# Patient Record
Sex: Female | Born: 1950 | Race: White | Hispanic: No | Marital: Married | Smoking: Never smoker
Health system: Southern US, Community
[De-identification: ages and names within clinical notes are randomized; demographics above are authoritative.]

## PROBLEM LIST (undated history)

## (undated) DIAGNOSIS — M2550 Pain in unspecified joint: Secondary | ICD-10-CM

## (undated) DIAGNOSIS — O2230 Deep phlebothrombosis in pregnancy, unspecified trimester: Secondary | ICD-10-CM

## (undated) DIAGNOSIS — M549 Dorsalgia, unspecified: Secondary | ICD-10-CM

## (undated) DIAGNOSIS — R3915 Urgency of urination: Secondary | ICD-10-CM

## (undated) DIAGNOSIS — J189 Pneumonia, unspecified organism: Secondary | ICD-10-CM

## (undated) DIAGNOSIS — K5780 Diverticulitis of intestine, part unspecified, with perforation and abscess without bleeding: Secondary | ICD-10-CM

## (undated) DIAGNOSIS — Z87442 Personal history of urinary calculi: Secondary | ICD-10-CM

## (undated) DIAGNOSIS — I8390 Asymptomatic varicose veins of unspecified lower extremity: Secondary | ICD-10-CM

## (undated) DIAGNOSIS — R350 Frequency of micturition: Secondary | ICD-10-CM

## (undated) DIAGNOSIS — F419 Anxiety disorder, unspecified: Secondary | ICD-10-CM

## (undated) DIAGNOSIS — N200 Calculus of kidney: Secondary | ICD-10-CM

## (undated) DIAGNOSIS — Z8619 Personal history of other infectious and parasitic diseases: Secondary | ICD-10-CM

## (undated) HISTORY — DX: Diverticulitis of intestine, part unspecified, with perforation and abscess without bleeding: K57.80

## (undated) HISTORY — DX: Deep phlebothrombosis in pregnancy, unspecified trimester: O22.30

## (undated) HISTORY — PX: TONSILLECTOMY: SUR1361

## (undated) HISTORY — PX: COLONOSCOPY: SHX174

## (undated) HISTORY — DX: Dorsalgia, unspecified: M54.9

## (undated) HISTORY — DX: Asymptomatic varicose veins of unspecified lower extremity: I83.90

## (undated) HISTORY — DX: Calculus of kidney: N20.0

## (undated) HISTORY — PX: OTHER SURGICAL HISTORY: SHX169

---
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
## 1981-05-20 DIAGNOSIS — O2230 Deep phlebothrombosis in pregnancy, unspecified trimester: Secondary | ICD-10-CM

## 1981-05-20 HISTORY — DX: Deep phlebothrombosis in pregnancy, unspecified trimester: O22.30

## 1999-06-05 ENCOUNTER — Other Ambulatory Visit: Admission: RE | Admit: 1999-06-05 | Discharge: 1999-06-05 | Payer: Self-pay | Admitting: *Deleted

## 2000-04-29 ENCOUNTER — Encounter: Payer: Self-pay | Admitting: Emergency Medicine

## 2000-04-29 ENCOUNTER — Emergency Department (HOSPITAL_COMMUNITY): Admission: EM | Admit: 2000-04-29 | Discharge: 2000-04-29 | Payer: Self-pay | Admitting: Emergency Medicine

## 2000-06-17 ENCOUNTER — Other Ambulatory Visit: Admission: RE | Admit: 2000-06-17 | Discharge: 2000-06-17 | Payer: Self-pay | Admitting: *Deleted

## 2001-06-18 ENCOUNTER — Other Ambulatory Visit: Admission: RE | Admit: 2001-06-18 | Discharge: 2001-06-18 | Payer: Self-pay | Admitting: Obstetrics and Gynecology

## 2002-06-30 ENCOUNTER — Other Ambulatory Visit: Admission: RE | Admit: 2002-06-30 | Discharge: 2002-06-30 | Payer: Self-pay | Admitting: Obstetrics and Gynecology

## 2003-07-08 ENCOUNTER — Other Ambulatory Visit: Admission: RE | Admit: 2003-07-08 | Discharge: 2003-07-08 | Payer: Self-pay | Admitting: Obstetrics and Gynecology

## 2004-08-03 ENCOUNTER — Ambulatory Visit: Payer: Self-pay | Admitting: Gastroenterology

## 2004-08-10 ENCOUNTER — Ambulatory Visit: Payer: Self-pay | Admitting: Gastroenterology

## 2005-10-21 ENCOUNTER — Ambulatory Visit: Payer: Self-pay | Admitting: Internal Medicine

## 2005-11-01 ENCOUNTER — Ambulatory Visit: Payer: Self-pay | Admitting: Internal Medicine

## 2005-12-06 ENCOUNTER — Ambulatory Visit: Payer: Self-pay | Admitting: Internal Medicine

## 2006-10-31 ENCOUNTER — Ambulatory Visit: Payer: Self-pay | Admitting: Internal Medicine

## 2006-10-31 LAB — CONVERTED CEMR LAB
ALT: 27 units/L (ref 0–40)
AST: 27 units/L (ref 0–37)
Albumin: 4.1 g/dL (ref 3.5–5.2)
Alkaline Phosphatase: 52 units/L (ref 39–117)
BUN: 12 mg/dL (ref 6–23)
Basophils Absolute: 0.1 10*3/uL (ref 0.0–0.1)
Basophils Relative: 0.9 % (ref 0.0–1.0)
Bilirubin, Direct: 0.1 mg/dL (ref 0.0–0.3)
CO2: 30 meq/L (ref 19–32)
Calcium: 9.8 mg/dL (ref 8.4–10.5)
Chloride: 102 meq/L (ref 96–112)
Cholesterol: 244 mg/dL (ref 0–200)
Creatinine, Ser: 0.4 mg/dL (ref 0.4–1.2)
Direct LDL: 116.7 mg/dL
Eosinophils Absolute: 0.2 10*3/uL (ref 0.0–0.6)
Eosinophils Relative: 3.8 % (ref 0.0–5.0)
GFR calc Af Amer: 214 mL/min
GFR calc non Af Amer: 177 mL/min
Glucose, Bld: 90 mg/dL (ref 70–99)
HCT: 43.1 % (ref 36.0–46.0)
HDL: 92.6 mg/dL (ref >39.0)
Hemoglobin: 14.9 g/dL (ref 12.0–15.0)
Lymphocytes Relative: 21.4 % (ref 12.0–46.0)
MCHC: 34.7 g/dL (ref 30.0–36.0)
MCV: 90.9 fL (ref 78.0–100.0)
Monocytes Absolute: 0.5 10*3/uL (ref 0.2–0.7)
Monocytes Relative: 8.5 % (ref 3.0–11.0)
Neutro Abs: 3.6 10*3/uL (ref 1.4–7.7)
Neutrophils Relative %: 65.4 % (ref 43.0–77.0)
Platelets: 220 10*3/uL (ref 150–400)
Potassium: 3.6 meq/L (ref 3.5–5.1)
RBC: 4.74 M/uL (ref 3.87–5.11)
RDW: 12.2 % (ref 11.5–14.6)
Sodium: 141 meq/L (ref 135–145)
TSH: 1.17 microintl units/mL (ref 0.35–5.50)
Total Bilirubin: 0.9 mg/dL (ref 0.3–1.2)
Total CHOL/HDL Ratio: 2.6
Total Protein: 7.1 g/dL (ref 6.0–8.3)
Triglycerides: 49 mg/dL (ref 0–149)
VLDL: 10 mg/dL (ref 0–40)
WBC: 5.6 10*3/uL (ref 4.5–10.5)

## 2006-11-14 ENCOUNTER — Ambulatory Visit: Payer: Self-pay | Admitting: Internal Medicine

## 2006-12-24 ENCOUNTER — Encounter: Payer: Self-pay | Admitting: Internal Medicine

## 2007-01-01 ENCOUNTER — Encounter: Payer: Self-pay | Admitting: Internal Medicine

## 2007-01-12 ENCOUNTER — Telehealth: Payer: Self-pay | Admitting: Internal Medicine

## 2007-01-20 ENCOUNTER — Telehealth (INDEPENDENT_AMBULATORY_CARE_PROVIDER_SITE_OTHER): Payer: Self-pay | Admitting: *Deleted

## 2007-01-21 ENCOUNTER — Ambulatory Visit: Payer: Self-pay | Admitting: Internal Medicine

## 2007-01-21 DIAGNOSIS — R21 Rash and other nonspecific skin eruption: Secondary | ICD-10-CM | POA: Insufficient documentation

## 2007-10-06 ENCOUNTER — Ambulatory Visit: Payer: Self-pay | Admitting: Vascular Surgery

## 2007-11-13 ENCOUNTER — Ambulatory Visit: Payer: Self-pay | Admitting: Internal Medicine

## 2007-11-13 LAB — CONVERTED CEMR LAB
ALT: 21 units/L (ref 0–35)
AST: 25 units/L (ref 0–37)
Albumin: 3.7 g/dL (ref 3.5–5.2)
Alkaline Phosphatase: 40 units/L (ref 39–117)
BUN: 12 mg/dL (ref 6–23)
Basophils Absolute: 0 10*3/uL (ref 0.0–0.1)
Basophils Relative: 0.2 % (ref 0.0–1.0)
Bilirubin Urine: NEGATIVE
Bilirubin, Direct: 0.1 mg/dL (ref 0.0–0.3)
CO2: 31 meq/L (ref 19–32)
Calcium: 9.5 mg/dL (ref 8.4–10.5)
Chloride: 104 meq/L (ref 96–112)
Cholesterol: 214 mg/dL (ref 0–200)
Creatinine, Ser: 0.5 mg/dL (ref 0.4–1.2)
Direct LDL: 107 mg/dL
Eosinophils Absolute: 0.3 10*3/uL (ref 0.0–0.7)
Eosinophils Relative: 5.7 % — ABNORMAL HIGH (ref 0.0–5.0)
GFR calc Af Amer: 165 mL/min
GFR calc non Af Amer: 136 mL/min
Glucose, Bld: 96 mg/dL (ref 70–99)
Glucose, Urine, Semiquant: NEGATIVE
HCT: 40.4 % (ref 36.0–46.0)
HDL: 86.2 mg/dL (ref >39.0)
Hemoglobin: 13.9 g/dL (ref 12.0–15.0)
Ketones, urine, test strip: NEGATIVE
Lymphocytes Relative: 21.9 % (ref 12.0–46.0)
MCHC: 34.3 g/dL (ref 30.0–36.0)
MCV: 90.7 fL (ref 78.0–100.0)
Monocytes Absolute: 0.5 10*3/uL (ref 0.1–1.0)
Monocytes Relative: 9.7 % (ref 3.0–12.0)
Neutro Abs: 3 10*3/uL (ref 1.4–7.7)
Neutrophils Relative %: 62.5 % (ref 43.0–77.0)
Nitrite: NEGATIVE
Platelets: 212 10*3/uL (ref 150–400)
Potassium: 4.1 meq/L (ref 3.5–5.1)
RBC: 4.46 M/uL (ref 3.87–5.11)
RDW: 12.4 % (ref 11.5–14.6)
Sodium: 142 meq/L (ref 135–145)
Specific Gravity, Urine: 1.025
TSH: 1.19 microintl units/mL (ref 0.35–5.50)
Total Bilirubin: 0.7 mg/dL (ref 0.3–1.2)
Total CHOL/HDL Ratio: 2.5
Total Protein: 6.6 g/dL (ref 6.0–8.3)
Triglycerides: 45 mg/dL (ref 0–149)
Urobilinogen, UA: 0.2
VLDL: 9 mg/dL (ref 0–40)
WBC: 4.9 10*3/uL (ref 4.5–10.5)
pH: 5.5

## 2007-12-01 ENCOUNTER — Ambulatory Visit: Payer: Self-pay | Admitting: Internal Medicine

## 2007-12-01 DIAGNOSIS — Z8672 Personal history of thrombophlebitis: Secondary | ICD-10-CM

## 2007-12-01 DIAGNOSIS — I872 Venous insufficiency (chronic) (peripheral): Secondary | ICD-10-CM | POA: Insufficient documentation

## 2007-12-25 ENCOUNTER — Encounter: Payer: Self-pay | Admitting: Internal Medicine

## 2008-01-19 ENCOUNTER — Ambulatory Visit: Payer: Self-pay | Admitting: Vascular Surgery

## 2008-02-22 ENCOUNTER — Ambulatory Visit: Payer: Self-pay | Admitting: Vascular Surgery

## 2008-02-29 ENCOUNTER — Ambulatory Visit: Payer: Self-pay | Admitting: Vascular Surgery

## 2008-03-28 ENCOUNTER — Ambulatory Visit: Payer: Self-pay | Admitting: Vascular Surgery

## 2008-04-12 ENCOUNTER — Ambulatory Visit: Payer: Self-pay | Admitting: Vascular Surgery

## 2008-07-18 LAB — CONVERTED CEMR LAB: Pap Smear: NORMAL

## 2008-07-28 ENCOUNTER — Ambulatory Visit: Payer: Self-pay | Admitting: Vascular Surgery

## 2008-12-29 ENCOUNTER — Encounter: Payer: Self-pay | Admitting: Internal Medicine

## 2009-01-06 ENCOUNTER — Ambulatory Visit: Payer: Self-pay | Admitting: Internal Medicine

## 2009-01-06 LAB — CONVERTED CEMR LAB
ALT: 24 units/L (ref 0–35)
AST: 28 units/L (ref 0–37)
Albumin: 4.1 g/dL (ref 3.5–5.2)
Alkaline Phosphatase: 52 units/L (ref 39–117)
BUN: 13 mg/dL (ref 6–23)
Basophils Absolute: 0 10*3/uL (ref 0.0–0.1)
Basophils Relative: 0.1 % (ref 0.0–3.0)
Bilirubin Urine: NEGATIVE
Bilirubin, Direct: 0.1 mg/dL (ref 0.0–0.3)
CO2: 34 meq/L — ABNORMAL HIGH (ref 19–32)
Calcium: 9.7 mg/dL (ref 8.4–10.5)
Chloride: 108 meq/L (ref 96–112)
Cholesterol: 229 mg/dL — ABNORMAL HIGH (ref 0–200)
Creatinine, Ser: 0.5 mg/dL (ref 0.4–1.2)
Direct LDL: 110.7 mg/dL
Eosinophils Absolute: 0.2 10*3/uL (ref 0.0–0.7)
Eosinophils Relative: 3.1 % (ref 0.0–5.0)
GFR calc non Af Amer: 135.51 mL/min (ref >60)
Glucose, Bld: 98 mg/dL (ref 70–99)
Glucose, Urine, Semiquant: NEGATIVE
HCT: 42.7 % (ref 36.0–46.0)
HDL: 103.4 mg/dL (ref >39.00)
Hemoglobin: 15.3 g/dL — ABNORMAL HIGH (ref 12.0–15.0)
Ketones, urine, test strip: NEGATIVE
Lymphocytes Relative: 21.9 % (ref 12.0–46.0)
Lymphs Abs: 1.3 10*3/uL (ref 0.7–4.0)
MCHC: 35.8 g/dL (ref 30.0–36.0)
MCV: 91.4 fL (ref 78.0–100.0)
Monocytes Absolute: 0.5 10*3/uL (ref 0.1–1.0)
Monocytes Relative: 8.8 % (ref 3.0–12.0)
Neutro Abs: 3.9 10*3/uL (ref 1.4–7.7)
Neutrophils Relative %: 66.1 % (ref 43.0–77.0)
Nitrite: NEGATIVE
Platelets: 178 10*3/uL (ref 150.0–400.0)
Potassium: 5.2 meq/L — ABNORMAL HIGH (ref 3.5–5.1)
Protein, U semiquant: NEGATIVE
RBC: 4.67 M/uL (ref 3.87–5.11)
RDW: 12.2 % (ref 11.5–14.6)
Sodium: 145 meq/L (ref 135–145)
Specific Gravity, Urine: 1.02
TSH: 0.97 microintl units/mL (ref 0.35–5.50)
Total Bilirubin: 0.9 mg/dL (ref 0.3–1.2)
Total CHOL/HDL Ratio: 2
Total Protein: 7.3 g/dL (ref 6.0–8.3)
Triglycerides: 41 mg/dL (ref 0.0–149.0)
Urobilinogen, UA: 0.2
VLDL: 8.2 mg/dL (ref 0.0–40.0)
WBC: 5.9 10*3/uL (ref 4.5–10.5)
pH: 5.5

## 2009-01-24 ENCOUNTER — Ambulatory Visit: Payer: Self-pay | Admitting: Internal Medicine

## 2009-01-24 DIAGNOSIS — M549 Dorsalgia, unspecified: Secondary | ICD-10-CM

## 2009-01-24 HISTORY — DX: Dorsalgia, unspecified: M54.9

## 2009-01-24 LAB — CONVERTED CEMR LAB
Bilirubin Urine: NEGATIVE
Blood in Urine, dipstick: NEGATIVE
Glucose, Urine, Semiquant: NEGATIVE
Ketones, urine, test strip: NEGATIVE
Nitrite: NEGATIVE
Protein, U semiquant: 30
Specific Gravity, Urine: >=1.03
Urobilinogen, UA: 0.2
WBC Urine, dipstick: NEGATIVE
pH: 5.5

## 2009-01-25 LAB — CONVERTED CEMR LAB
BUN: 15 mg/dL (ref 6–23)
CO2: 31 meq/L (ref 19–32)
Calcium: 9.7 mg/dL (ref 8.4–10.5)
Chloride: 105 meq/L (ref 96–112)
Creatinine, Ser: 0.4 mg/dL (ref 0.4–1.2)
Creatinine,U: 104 mg/dL
GFR calc non Af Amer: 175.28 mL/min (ref >60)
Glucose, Bld: 103 mg/dL — ABNORMAL HIGH (ref 70–99)
Microalb Creat Ratio: 19.2 mg/g (ref 0.0–30.0)
Microalb, Ur: 2 mg/dL — ABNORMAL HIGH (ref 0.0–1.9)
Potassium: 4.2 meq/L (ref 3.5–5.1)
Sodium: 143 meq/L (ref 135–145)

## 2009-03-03 ENCOUNTER — Encounter: Payer: Self-pay | Admitting: Internal Medicine

## 2009-05-20 DIAGNOSIS — Z87442 Personal history of urinary calculi: Secondary | ICD-10-CM

## 2009-05-20 HISTORY — DX: Personal history of urinary calculi: Z87.442

## 2009-08-03 ENCOUNTER — Ambulatory Visit: Payer: Self-pay | Admitting: Vascular Surgery

## 2009-08-03 LAB — CONVERTED CEMR LAB: Pap Smear: NORMAL

## 2010-01-17 ENCOUNTER — Ambulatory Visit: Payer: Self-pay | Admitting: Internal Medicine

## 2010-01-17 LAB — CONVERTED CEMR LAB
ALT: 19 units/L (ref 0–35)
AST: 26 units/L (ref 0–37)
Albumin: 4.1 g/dL (ref 3.5–5.2)
Alkaline Phosphatase: 46 units/L (ref 39–117)
Basophils Relative: 0.6 % (ref 0.0–3.0)
Bilirubin, Direct: 0.1 mg/dL (ref 0.0–0.3)
CO2: 33 meq/L — ABNORMAL HIGH (ref 19–32)
Calcium: 9.6 mg/dL (ref 8.4–10.5)
Chloride: 102 meq/L (ref 96–112)
Creatinine, Ser: 0.4 mg/dL (ref 0.4–1.2)
Eosinophils Absolute: 0.3 10*3/uL (ref 0.0–0.7)
Eosinophils Relative: 6.1 % — ABNORMAL HIGH (ref 0.0–5.0)
Hemoglobin: 14.7 g/dL (ref 12.0–15.0)
Lymphocytes Relative: 26.4 % (ref 12.0–46.0)
MCHC: 34.3 g/dL (ref 30.0–36.0)
Neutro Abs: 2.8 10*3/uL (ref 1.4–7.7)
Neutrophils Relative %: 57.5 % (ref 43.0–77.0)
Nitrite: NEGATIVE
Protein, U semiquant: NEGATIVE
RBC: 4.55 M/uL (ref 3.87–5.11)
Sodium: 141 meq/L (ref 135–145)
Total CHOL/HDL Ratio: 2
Total Protein: 6.8 g/dL (ref 6.0–8.3)
Triglycerides: 37 mg/dL (ref 0.0–149.0)
Urobilinogen, UA: 0.2
VLDL: 7.4 mg/dL (ref 0.0–40.0)
WBC: 4.9 10*3/uL (ref 4.5–10.5)

## 2010-01-31 ENCOUNTER — Ambulatory Visit: Payer: Self-pay | Admitting: Internal Medicine

## 2010-01-31 DIAGNOSIS — S301XXA Contusion of abdominal wall, initial encounter: Secondary | ICD-10-CM

## 2010-02-05 ENCOUNTER — Encounter: Payer: Self-pay | Admitting: Internal Medicine

## 2010-02-09 ENCOUNTER — Encounter: Payer: Self-pay | Admitting: Internal Medicine

## 2010-02-09 ENCOUNTER — Ambulatory Visit: Payer: Self-pay | Admitting: Cardiology

## 2010-02-09 ENCOUNTER — Ambulatory Visit: Payer: Self-pay

## 2010-02-09 ENCOUNTER — Ambulatory Visit (HOSPITAL_COMMUNITY): Admission: RE | Admit: 2010-02-09 | Discharge: 2010-02-09 | Payer: Self-pay | Admitting: Internal Medicine

## 2010-05-01 ENCOUNTER — Emergency Department (HOSPITAL_COMMUNITY)
Admission: EM | Admit: 2010-05-01 | Discharge: 2010-05-02 | Payer: Self-pay | Source: Home / Self Care | Admitting: Emergency Medicine

## 2010-05-01 ENCOUNTER — Ambulatory Visit: Payer: Self-pay | Admitting: Internal Medicine

## 2010-05-01 DIAGNOSIS — R350 Frequency of micturition: Secondary | ICD-10-CM | POA: Insufficient documentation

## 2010-05-01 LAB — CONVERTED CEMR LAB
Nitrite: NEGATIVE
Protein, U semiquant: NEGATIVE
Urobilinogen, UA: 0.2
WBC Urine, dipstick: NEGATIVE

## 2010-05-02 ENCOUNTER — Telehealth: Payer: Self-pay | Admitting: Internal Medicine

## 2010-05-02 ENCOUNTER — Telehealth: Payer: Self-pay | Admitting: *Deleted

## 2010-05-02 ENCOUNTER — Encounter: Payer: Self-pay | Admitting: Internal Medicine

## 2010-05-02 DIAGNOSIS — N200 Calculus of kidney: Secondary | ICD-10-CM

## 2010-05-02 HISTORY — DX: Calculus of kidney: N20.0

## 2010-05-04 ENCOUNTER — Encounter: Payer: Self-pay | Admitting: Internal Medicine

## 2010-06-05 ENCOUNTER — Ambulatory Visit
Admission: RE | Admit: 2010-06-05 | Discharge: 2010-06-05 | Payer: Self-pay | Source: Home / Self Care | Attending: Vascular Surgery | Admitting: Vascular Surgery

## 2010-06-19 NOTE — Letter (Signed)
Summary: [place]  [facility]   Imported By: Maryln Gottron 02/14/2010 12:38:13  _____________________________________________________________________  External Attachment:    Type:   Image     Comment:   External Document

## 2010-06-19 NOTE — Assessment & Plan Note (Signed)
Summary: CPX/CJR   Vital Signs:  Patient profile:   60 year old female Menstrual status:  postmenopausal Height:      68 inches Weight:      131 pounds BMI:     19.99 Pulse rate:   72 / minute BP sitting:   132 / 80  (left arm) Cuff size:   regular  Vitals Entered By: Romualdo Bolk, CMA (AAMA) (January 31, 2010 10:34 AM) CC: CPX no pap- Pt has a gyn.   History of Present Illness: Linda Richard  comes in today  for preventive visit .  since last visit she has been pretty well but has had some back   issues.  hx of shoulder and back injury in the remote  past .   fell  this week  on trail  over a root and hurt bak and burises  .Tends to" hurt all over.  "  but really left lower back with no  radiation , stiff l in am .    Minimal meds   for this and coping . Fam hx of non deforming arthritis..     UTD on PV parameters.    Preventive Care Screening  Mammogram:    Date:  01/02/2010    Results:  normal   Pap Smear:    Date:  08/03/2009    Results:  normal   Prior Values:    Pap Smear:  normal (07/18/2008)    Mammogram:  normal (12/29/2008)    Colonoscopy:  normal (08/10/2004)    Last Tetanus Booster:  Tdap (11/01/2005)   Preventive Screening-Counseling & Management  Alcohol-Tobacco     Alcohol drinks/day: <1     Alcohol type: wine     Smoking Status: never  Caffeine-Diet-Exercise     Caffeine use/day: 1     Does Patient Exercise: yes  Hep-HIV-STD-Contraception     Dental Visit-last 6 months yes     Sun Exposure-Excessive: no  Safety-Violence-Falls     Seat Belt Use: yes     Firearms in the Home: firearms in the home     Firearm Counseling: not indicated; uses recommended firearm safety measures     Smoke Detectors: yes     Fall Risk: no usually   Current Medications (verified): 1)  Adult Aspirin Low Strength 81 Mg  Tbdp (Aspirin) 2)  Multi-Vitamin   Tabs (Multiple Vitamin) 3)  Vitamin D 1000 Unit  Tabs (Cholecalciferol) 4)  Calcium 500/d 500-125  Mg-Unit  Tabs (Calcium Carbonate-Vitamin D) 5)  Fish Oil   Oil (Fish Oil) 6)  Vitamin E 400 Unit Caps (Vitamin E)  Allergies (verified): No Known Drug Allergies  Past History:  Past medical, surgical, family and social histories (including risk factors) reviewed, and no changes noted (except as noted below).  Past Medical History: Phlebitis/Blood Clots DVT  in pregnancy  Varivose veins   G3P2  CONSULTANTS L[employer] [address]  Past Surgical History: Reviewed history from 01/24/2009 and no changes required. Tonsillectomy Caesarean section Veins Stripped Laser rx VV  Past History:  Care Management: Gynecology: Margaretmary Bayley, NP Vascular Surgery: [facility] Dermatology : Margo Aye  Gastroenterology: [facility]  Family History: Reviewed history from 12/01/2007 and no changes required. Family History Hypertension Family History Kidney disease kidney stones Family History Osteoporosis Family History Ovarian cancer mom 58s  Family History Thyroid disease Father Bladder cancer 74's   son age 15 had irreg HB but ok had to have cardioversion and now on controller meds for 3 months otherwise  well  Social History: Reviewed history from 01/24/2009 and no changes required. Occupation:   retired   [employer] Child Support Never Smoked Alcohol use-yes Drug use-no Regular exercise-yes Married Hh of 2    Pet cat .   58 month old grandchild Fall Risk:  no usually  Seat Belt Use:  yes Dental Care w/in 6 mos.:  yes Sun Exposure-Excessive:  no  Review of Systems  The patient denies anorexia, fever, weight loss, weight gain, vision loss, decreased hearing, hoarseness, chest pain, syncope, dyspnea on exertion, peripheral edema, prolonged cough, headaches, hemoptysis, abdominal pain, melena, hematochezia, severe indigestion/heartburn, hematuria, muscle weakness, transient blindness, difficulty walking, depression, unusual weight change, abnormal bleeding, enlarged lymph nodes,  angioedema, and breast masses.    Physical Exam  General:  alert, well-developed, and well-nourished.   in nad normal appearance, healthy-appearing, and cooperative to examination.   Head:  normocephalic and atraumatic.   Eyes:  vision grossly intact, pupils equal, and pupils round.   Ears:  R ear normal, L ear normal, and no external deformities.   Nose:  no external deformity and no nasal discharge.   Mouth:  good dentition, pharynx pink and moist, and no erythema.   Neck:  No deformities, masses, or tenderness noted.no thyromegaly.   Chest Wall:  no bruising or deformity  Breasts:  No mass, nodules, thickening, tenderness, bulging, retraction, inflamation, nipple discharge or skin changes noted.   Lungs:  Normal respiratory effort, chest expands symmetrically. Lungs are clear to auscultation, no crackles or wheezes.no dullness.   Heart:  Normal rate and regular rhythm. S1 and S2 normal without gallop, murmur, , rub or other extra sounds.no lifts.   mid systolic click  when laying  no murmur heard  ( review of last ekg 2009 wnl) Abdomen:  Bowel sounds positive,abdomen soft and non-tender without masses, organomegaly or  noted. Genitalia:  per gyne Msk:  midl scoliosis  tender left lower back area but good rom today and Pulses:  R and L carotid,radial,femoral,dorsalis pedis and posterior tibial pulses are full and equal bilaterally varicose veins  Extremities:  no clubbing cyanosis or edema  Neurologic:  alert & oriented X3, strength normal in all extremities, gait normal, and DTRs symmetrical and normal.   Skin:  suprapubic  hematoma 5 cm  minimal tenderness no pelvic tenderness Cervical Nodes:  No lymphadenopathy noted Axillary Nodes:  No palpable lymphadenopathy Inguinal Nodes:  No significant adenopathy Psych:  Normal eye contact, appropriate affect. Cognition appears normal.    Impression & Recommendations:  Problem # 1:  HEALTH MAINTENANCE EXAM, ADULT (ICD-V70.0) continue  healthy lifestyle     preventive   discussed   labs  reviewed  . lipids elevated   but high hdls and ratio of 2   Problem # 2:  BACK PAIN (ICD-724.5)  left  and ongoing  and persistant   and stiff in am      suggest  referral  about dx and management  no other alarm features.  Her updated medication list for this problem includes:    Adult Aspirin Low Strength 81 Mg Tbdp (Aspirin)  Orders: Orthopedic Referral (Ortho)  Problem # 3:  UNDIAGNOSED CARDIAC SOUND SMID SYS CLICK (ICD-785.2) Asymptomatic  ? MVP   no murmur with this  .  dsic   will get echo  . has had nl ekgs  ( son with hs of rhythym problem  requiring cardioversion. )    Orders: Cardiology Referral (Cardiology)  Problem # 4:  ABDOMINAL WALL CONTUSION (ICD-922.2) Assessment: New uncomplicated    monitor   call if needed  Problem # 5:  VENOUS INSUFFICIENCY, CHRONIC (ICD-459.81) Assessment: Comment Only  Complete Medication List: 1)  Adult Aspirin Low Strength 81 Mg Tbdp (Aspirin) 2)  Multi-vitamin Tabs (Multiple vitamin) 3)  Vitamin D 1000 Unit Tabs (Cholecalciferol) 4)  Calcium 500/d 500-125 Mg-unit Tabs (Calcium carbonate-vitamin d) 5)  Fish Oil Oil (Fish oil) 6)  Vitamin E 400 Unit Caps (Vitamin e)  Other Orders: Future Orders: Admin 1st Vaccine (16109) ... 02/01/2010 Flu Vaccine 79yrs + (60454) ... 02/01/2010  Patient Instructions: 1)  consider taking 600- 800 ibuprofen at night or in am.    for pain.  2)   Will do ortho referral  for back.  3)  cool compresses to bruising area . 4)  will contact you about echo test of heart to check click sound. 5)  check yearly  or as needed.  Flu Vaccine Consent Questions     Do you have a history of severe allergic reactions to this vaccine? no    Any prior history of allergic reactions to egg and/or gelatin? no    Do you have a sensitivity to the preservative Thimersol? no    Do you have a past history of Guillan-Barre Syndrome? no    Do you currently have an acute  febrile illness? no    Have you ever had a severe reaction to latex? no    Vaccine information given and explained to patient? yes    Are you currently pregnant? no    Lot Number:[id number]   Exp Date:11/17/2010   Site Given  Left Deltoid IM     .lbflu Romualdo Bolk, CMA (AAMA)  February 01, 2010 7:39 AM

## 2010-06-21 NOTE — Assessment & Plan Note (Signed)
Summary: UTI? // RS   Vital Signs:  Patient profile:   60 year old female Menstrual status:  postmenopausal Weight:      130 pounds Temp:     98.7 degrees F oral Pulse rate:   78 / minute BP sitting:   140 / 80  (left arm) Cuff size:   regular  Vitals Entered By: Romualdo Bolk, CMA Duncan Dull) (May 01, 2010 3:41 PM) CC: Lower Back Pain, frequency, urgency   History of Present Illness: Linda Richard  comes in today  for   above prolem. Acute onset yesterday.  and sudden pressure and urinary urgency.  and now today pressure and and still feels urgency  no fever .  No NVD  change in bowel habits. Hx of uti with pregnancy  otherwise not.  NO vag signs      Back  pain has seen  ortho   reversal atble has helped .    Preventive Screening-Counseling & Management  Alcohol-Tobacco     Smoking Status: never  Caffeine-Diet-Exercise     Caffeine use/day: 1     Does Patient Exercise: yes  Hep-HIV-STD-Contraception     Dental Visit-last 6 months yes     Sun Exposure-Excessive: no  Safety-Violence-Falls     Seat Belt Use: yes     Fall Risk: no usually       Drug Use:  no.    Current Medications (verified): 1)  Adult Aspirin Low Strength 81 Mg  Tbdp (Aspirin) 2)  Multi-Vitamin   Tabs (Multiple Vitamin) 3)  Vitamin D 1000 Unit  Tabs (Cholecalciferol) 4)  Calcium 500/d 500-125 Mg-Unit  Tabs (Calcium Carbonate-Vitamin D) 5)  Fish Oil   Oil (Fish Oil) 6)  Vitamin E 400 Unit Caps (Vitamin E)  Allergies (verified): No Known Drug Allergies  Past History:  Past medical, surgical, family and social histories (including risk factors) reviewed for relevance to current acute and chronic problems.  Past Medical History: Reviewed history from 01/31/2010 and no changes required. 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 Tonsillectomy Caesarean section Veins Stripped Laser rx VV  Past History:  Care Management: Gynecology: Margaretmary Bayley, NP Vascular Surgery: [facility] Dermatology : Margo Aye  Gastroenterology: [facility]  Family History: Reviewed history from 12/01/2007 and no changes required. Family History Hypertension Family History Kidney disease kidney stones Family History Osteoporosis Family History Ovarian cancer mom 18s  Family History Thyroid disease Father Bladder cancer 46's   son age 83 had irreg HB but ok had to have cardioversion and now on controller meds for 3 months otherwise well  Social History: Reviewed history from 01/31/2010 and no changes required. Occupation:   retired   [employer] Child Support Never Smoked Alcohol use-yes Drug use-no Regular exercise-yes Married Hh of 2    Pet cat .   51 month old grandchild   Review of Systems  The patient denies anorexia, fever, abdominal pain, melena, hematochezia, severe indigestion/heartburn, hematuria, incontinence, and genital sores.    Physical Exam  General:  Well-developed,well-nourished,in no acute distress; alert,appropriate and cooperative throughout examination Head:  normocephalic and atraumatic.   Abdomen:  Bowel sounds positive,abdomen soft and non-tender without masses, organomegaly or hernias noted.  no flank pain    Extremities:  nl cap arefill  Neurologic:  non focal  Skin:  turgor normal, color normal, no ecchymoses, and no petechiae.  Cervical Nodes:  No lymphadenopathy noted Psych:  Oriented X3 and normally interactive.     Impression & Recommendations:  Problem # 1:  FREQUENCY, URINARY (ICD-788.41)  with fairly sudden onset   consider  low count uti .     cx and empiric rx.    avoid bladder irritants.  Orders: T-Culture, Urine ([id number]) UA Dipstick w/o Micro (automated)  (81003)  Discussed use of medication.   Problem # 2:  BACK PAIN (ICD-724.5) Assessment: Improved with  management Her updated medication list for this problem includes:    Adult Aspirin Low Strength 81 Mg Tbdp (Aspirin)  Complete Medication List: 1)  Adult Aspirin Low Strength 81 Mg Tbdp (Aspirin) 2)  Multi-vitamin Tabs (Multiple vitamin) 3)  Vitamin D 1000 Unit Tabs (Cholecalciferol) 4)  Calcium 500/d 500-125 Mg-unit Tabs (Calcium carbonate-vitamin d) 5)  Fish Oil Oil (Fish oil) 6)  Vitamin E 400 Unit Caps (Vitamin e) 7)  Ciprofloxacin Hcl 500 Mg Tabs (Ciprofloxacin hcl) .Marland Kitchen.. 1 by mouth two times a day for uti  Patient Instructions: 1)  take antibiotic   for presumed bladder infection 2)  should be better in 2-3 days. 3)  call if needed. 4)  Avoid carbonation and caffiene  and alcohol can aggravate it   until better.  Prescriptions: CIPROFLOXACIN HCL 500 MG TABS (CIPROFLOXACIN HCL) 1 by mouth two times a day for UTI  #6 x 0   Entered and Authorized by:   Madelin Headings MD   Signed by:   Madelin Headings MD on 05/01/2010   Method used:   Electronically to        [facility] [phone]* (retail)       [address]       [place], [place]  [id number]       Ph: [phone] or [phone]       Fax: [phone]   RxID:   [id number]    Orders Added: 1)  T-Culture, Urine [[id number]] 2)  Est. Patient Level III [95188] 3)  UA Dipstick w/o Micro (automated)  [81003]    Laboratory Results   Urine Tests  Date/Time Received: May 01, 2010 3:45 PM   Routine Urinalysis   Glucose: negative   (Normal Range: Negative) Bilirubin: negative   (Normal Range: Negative) Ketone: trace (5)   (Normal Range: Negative) Spec. Gravity: 1.020   (Normal Range: 1.003-1.035) Blood: trace-lysed   (Normal Range: Negative) pH: 6.5   (Normal Range: 5.0-8.0) Protein: negative   (Normal Range: Negative) Urobilinogen: 0.2   (Normal Range: 0-1) Nitrite: negative   (Normal Range: Negative) Leukocyte Esterace: negative   (Normal Range: Negative)    Comments:  Romualdo Bolk, CMA (AAMA)  May 01, 2010 3:45 PM

## 2010-06-21 NOTE — Progress Notes (Signed)
Summary: please advise   Phone Note Call from Patient Call back at Home Phone [phone]   Caller: Patient----triage vm Reason for Call: Talk to Nurse Summary of Call: was seen yesterday with a bladder infection. Went to ER last night with a kidney stone, which hasn't passed yet. The stone is small enough that she could pass it. she is at home. Initial call taken by: Warnell Forester,  May 02, 2010 9:59 AM  Follow-up for Phone Call        call patient   about how she is doing .    and anything we need to facilitate  her getting to the urologist .  our culture not back .   Also tell her she has gall stones.    not related to above  . consider having this removed   in the future but if no signs of this can leave alone  for now Follow-up by: Madelin Headings MD,  May 02, 2010 2:10 PM  Additional Follow-up for Phone Call Additional follow up Details #1::        I tried to call pt back but when I called pt back it was a fax. Additional Follow-up by: Romualdo Bolk, CMA (AAMA),  May 02, 2010 2:42 PM  New Problems: NEPHROLITHIASIS (ICD-592.0)   Additional Follow-up for Phone Call Additional follow up Details #2::    Pt called to check on status of msg...Marland KitchenMarland KitchenPlease call pt at home # [phone]  or   cell # [phone].  Follow-up by: Debbra Riding,  May 03, 2010 10:37 AM  Additional Follow-up for Phone Call Additional follow up Details #3:: Details for Additional Follow-up Action Taken: Pt is feeling some better but does want to go ahead with referral. Additional Follow-up by: Romualdo Bolk, CMA Duncan Dull),  May 03, 2010 11:34 AM  New Problems: NEPHROLITHIASIS Kathrynn Humble)    Ife, Vitelli - MRN: [id number] Acct#: [id number] PHYSICIAN DOCUMENTATION SHEET Wed Dec 14 02:44:09 EST 2011 Eligha Bridegroom. [facility] [address] [place], [place] [address] PHONE: [phone] MRN: [id number] Account #: [id number] Name: Kyna, Blahnik Sex:  F Age: 60 DOB: [birth year] Complaint: Flank pain Primary Diagnosis: Ureteral stone left Arrival Time: 05/01/2010 23:14 Discharge Time: 05/02/2010 02:42 All Providers: Dr. Kennon Rounds - MD PROVIDER: Dr. Kennon Rounds - MD HPI: The patient is a 60 year old female who presents with a chief complaint of flank pain. The history was provided by the patient and spouse. Pt. with onset left flank pain radiating to suprapubic region with dysuria and urinary hesitation/retention. Pt. started on antibiotic for presumed UTI. Pt. came in tonight for persistent discomfort, nausea, vomiting, urinary retention/urgency. The flank pain started yesterday. The onset was acute. The Pattern is constant. The Course is persistent. The flank pain is located in the left flank. The flank pain radiates to the left suprapubic region. It is characterized as cramping, sharp and colicky. The symptoms are described as moderate to severe. The condition is aggravated by palpation. The condition is relieved by nothing. The symptoms have been associated with hematuria, nausea and vomiting, while the symptoms have not been associated with fever. The last bowel movement was 12 hour(s) ago. The patient has a significant history of similar symptoms previously and recently being evaluated for this complaint. 01:20 05/02/2010 by Kennon Rounds - MD, Dr. Linus Orn: Statement: all systems negative except as marked or noted in the HPI 01:20 05/02/2010 by Kennon Rounds - MD, Dr. [facility] Documentation: physician reviewed/amended Historian: patient  Patient's Current Physicians Patient's Current Physicians (please list PCP first) Panosh - IM, Neta Mends Past medical history: urinary tract infection Surgical History: varicose vein stripping Social History: non-smoker, non-drinker, no drug abuse Special Needs: none 1 Haelee, Bolen - MRN: [id number] Acct#: [id number] Allergies Drug Reaction Allergy Note NKDA 01:20 05/02/2010 by Kennon Rounds - MD, Dr. Joseph Pierini Medications: Documentation: physician reviewed/amended Medications Medication [Medication] Dosage Frequency Last Dose ciprofloxacin Oral 500 mg bid 01:20 05/02/2010 by Kennon Rounds - MD, Dr. Physical examination: Vital signs and O2 SAT: reviewed Constitutional: well developed, well nourished, Uncomfortable appearing Eyes: normal appearance, no scleral icterus ENMT: mouth and pharynx normal Spine: lumbar spine non-tender Cardiovascular: regular rate and rhythm, no murmur, rub, or gallop Respiratory: normal, breath sounds clear & equal bilaterally, no rales, rhonchi, wheezes, or rub Chest: movement normal Abdomen: soft, nontender Genitourinary: CVA tenderness left Extremities: normal Neuro: Cranial Nerves II-XII intact, motor intact in all extremities Skin: color normal, no rash, warm, dry Psychiatric: AA&Ox4 01:20 05/02/2010 by Kennon Rounds - MD, Dr. Reviewed result: Result Type: Cleda Daub: [id number] Step Type: LAB Procedure Name: PREGNANCY, URINE POC Procedure: PREGNANCY, URINE POC Procedure Notes: PREGNANCY, URINE - THE SENSITIVITY OF THIS METHODOLOGY IS >24 mIU/mL Result: PREGNANCY, URINE NEGATIVE 00:10 05/02/2010 by Kennon Rounds - MD, Dr. Reviewed result: Result Type: Cleda Daub: [id number] Kayani, Rapaport - MRN: [id number] Acct#: [id number] Step Type: LAB Procedure Name: CBC WITH DIFF Procedure: CBC WITH DIFF Result: WBC COUNT 9.6 K/uL [4.0-10.5] RBC COUNT 4.65 MIL/uL [3.87-5.11] HEMOGLOBIN 14.4 g/dL [10.2-72.5] HEMATOCRIT 41.5 % [36.0-46.0] MCV 89.2 fL [78.0-100.0] MCH 31.0 pg [26.0-34.0] MCHC 34.7 g/dL [36.6-44.0] RDW 34.7 % [11.5-15.5] PLATELET COUNT 172 K/uL [150-400] NEUTROPHIL 81 % [43-77] H ABS GRANULOCYTE 7.7 K/uL [1.7-7.7] LYMPHOCYTE 12 % [12-46] ABS LYMPH 1.1 K/uL [0.7-4.0] MONOCYTE 7 % [3-12] ABS MONOCYTE 0.6 K/uL [0.1-1.0] EOSINOPHIL 1 % [0-5] ABS EOS 0.1 K/uL [0.0-0.7] BASOPHIL 0 %  [0-1] ABS BASO 0.0 K/uL [0.0-0.1] 00:50 05/02/2010 by Kennon Rounds - MD, Dr. Reviewed result: Result Type: Cleda Daub: [id number] Step Type: LAB Procedure Name: URINE MACROSCOPIC Procedure: URINE MACROSCOPIC Result: URINE COLOR YELLOW [YELLOW] URINE APPEARANCE CLOUDY [CLEAR] A URINE SPEC GRAVITY 1.023 [1.005-1.030] URINE PH 5.0 [5.0-8.0] URINE GLUCOSE NEGATIVE mg/dL [NEG] URINE HEMOGLOBIN LARGE [NEG] A URINE BILIRUBIN NEGATIVE [NEG] URINE KETONES NEGATIVE mg/dL [NEG] URINE TOTAL PROTEIN NEGATIVE mg/dL [NEG] URINE UROBILINOGEN 0.2 mg/dL [7.5-6.4] URINE NITRITE NEGATIVE [NEG] LEUKOCYTE ESTERASE NEGATIVE [NEG] 01:15 05/02/2010 by Kennon Rounds - MD, Dr. Shannan Harper Lari, Linson - MRN: [id number] Acct#: [id number] Reviewed result: Result Type: Cleda Daub: [id number] Step Type: LAB Procedure Name: URINE MICROSCOPIC Procedure: URINE MICROSCOPIC Result: URINE RBC'S 21-50 RBC/hpf [<3] CAST HYALINE CASTS [NEG] A URINE OTHER MUCOUS PRESENT 01:15 05/02/2010 by Kennon Rounds - MD, Dr. Reviewed result: Result Type: Cleda Daub: [id number] Step Type: LAB Procedure Name: BASIC METABOLIC PANEL Procedure: BASIC METABOLIC PANEL Procedure Notes: GFR, Est Afr Am - The eGFR has been calculated using the MDRD equation. This calculation has not been validated in all clinical situations. eGFR's persistently <60 mL/min signify possible Chronic Kidney Disease. Result: SODIUM 138 mEq/L [135-145] POTASSIUM 3.7 mEq/L [3.5-5.1] CHLORIDE 100 mEq/L [96-112] CARBON DIOXIDE 28 mEq/L [19-32] GLUCOSE 139 mg/dL [55-73] H BUN 17 mg/dL [2-20] CREATININE 2.54 mg/dL [2.7-0.6] CALCIUM 9.5 mg/dL [2.3-76.2] GFR, Est Non Af Am >60 mL/min [>60] GFR, Est Afr Am >60 mL/min [>60] 01:35 05/02/2010 by Kennon Rounds - MD, Dr. Reviewed result: Result Type: Cleda Daub: [id number] Step Type: XRAY Procedure Name:  CT ABD/PELVIS WO CM Procedure: CT ABD/PELVIS WO CM Result: Clinical  Data: Abdominal pain and left flank pain; dysuria, nausea and vomiting. 4 Noble, Bodie MRN: [id number] Acct#: [id number] CT ABDOMEN AND PELVIS WITHOUT CONTRAST Technique: Multidetector CT imaging of the abdomen and pelvis was performed following the standard protocol without intravenous contrast. Comparison: None. Findings: The visualized lung bases are clear. The liver and spleen are unremarkable in appearance. Note is made of a single stone within the gallbladder, measuring approximately 1.5 cm in size. The gallbladder is otherwise unremarkable in appearance. The pancreas and adrenal glands are unremarkable. There is moderate left-sided hydronephrosis, with left-sided perinephric stranding and fluid, and prominence of the left ureter to the level of an obstructing 4 x 2 mm stone in the distal left ureter, just proximal to the left vesicoureteral junction. An additional tiny 2 mm nonobstructing stone is noted at the upper pole of the left kidney; there is also a 2 mm stone at the interpole region of the right kidney. The right kidney is otherwise unremarkable in appearance. No free fluid is identified. The small bowel is unremarkable in appearance. The stomach is within normal limits. No acute vascular abnormalities are seen. The venous structures are not well assessed given the lack of contrast. The appendix is not readily characterized; there is no evidence of appendicitis. Diverticulosis is noted along the sigmoid colon, without evidence of diverticulitis. The bladder is decompressed, with a Foley catheter in place. No inguinal lymphadenopathy is seen. Prominent varices are noted within the anterior soft tissues at the upper pelvis. No acute osseous abnormalities are identified. Vacuum phenomenon is noted at L4-L5; mild right convex upper lumbar scoliosis is noted. IMPRESSION: 1. Moderate left-sided hydronephrosis, with left-sided perinephric stranding and fluid, and an  obstructing 4 x 2 mm stone in the distal left ureter, just proximal to the left vesicoureteral junction. 2. Tiny nonobstructing stones noted within both kidneys. 3. Cholelithiasis; gallbladder otherwise unremarkable in appearance. 4. Diverticulosis along the sigmoid colon, without evidence of diverticulitis. 5. Prominent superficial varices noted anteriorly at the upper 5 Monia, Timmers - MRN: [id number] Acct#: [id number] pelvis. 6. Mild right convex upper lumbar scoliosis. 01:35 05/02/2010 by Kennon Rounds - MD, Dr. ED Course: Comments: 50 mL urine out on post void residual catheter placement not suggestive of actual urinary retention but rather urinary urgency. 01:21 05/02/2010 by Kennon Rounds - MD, Dr. MDM: MDM Differential Diagnosis Support Tests pyelonephritis, renal colic, renal Failure - Acute, ureterolithiasis, urinary tract infection 01:21 05/02/2010 by Kennon Rounds - MD, Dr. Patient disposition: Patient disposition: Disch - Home Primary Diagnosis: ureteral stone left Additional diagnoses: ureteral colic left Counseling: advised of diagnosis, advised of treatment plan, advised of xray and lab findings, advised of need for close follow-up, advised of need to return for worsening or changing symptoms, advised of specific symptoms that should prompt their return 02:13 05/02/2010 by Kennon Rounds - MD, Dr. Libby Maw orders: Verify orders: verify all orders 01:22 05/02/2010 by Kennon Rounds - MD, Dr. Milinda Pointer electronically signed by Responsible Physician 01:22 05/02/2010 by Kennon Rounds - MD, Dr. Milinda Pointer electronically signed by Responsible Physician 02:27 05/02/2010 by Kennon Rounds - MD, Dr. Prescriptions: Prescription Medication Dispense Sig Line 6 Bathsheba, Durrett MRN: [id number] Acct#: [id number] Prescription Medication Dispense Sig Line ibuprofen 200 mg Tab Trade Two-Three tablets PO q 8 hours prn pain; Take it with meals oxyCODONEacetaminophen 5  mg-325 mg Tab Twenty (20) one-two tabs PO q 4-6 hrs prn pain Zofran 8 mg  Tab Twelve (12) One tablet PO q 6 hours prn nausea tamsulosin ER 0.4 mg 24 hr Cap Six (6) One tablet PO at bedtime Drug interactions: Drug interactions Moderate ciprofloxacin Oral Zofran Oral CIPROFLOXACIN/QT PROLONGING AGENTS Moderate Zofran Oral ciprofloxacin Oral ONDANSETRON/QT PROLONGING AGENTS 02:14 05/02/2010 by Kennon Rounds - MD, Dr. Medication disposition: Medications Medication [Medication] Dosage Frequency Last Dose Medication disposition PCP contact ciprofloxacin Oral 500 mg bid continue 02:24 05/02/2010 by Kennon Rounds - MD, Dr. Discharge: Discharge Instructions: *resource guide, kidney stones Append a Note to Discharge Instructions: Use medications as prescribed and as needed for symptomatic relief. Follow up with your urologist or one of the urologists listed below as needed in 2-3 days if symptoms persist for re-evaluation and further treatment at that time as deemed necessary. If symptoms worsen and are not treated adequately by prescribed medications, return sooner to the ER for re-evaluation. ED's provide medical screening exams and initial stabilizing treatment of emergency medical conditions. Medicine is an Pharmacologist and many conditions cannot be diagnosed or completely treated during a single ED visit. Your treating healthcare provider(s) today feel your condition has been stabilized so further care as an outpatient is reasonable. Emergency care does not substitute for complete, ongoing, or follow-up care by your primary care physician or consultant. Your medication list was reviewed prior to treatment, and at discharge, by the treating provider for the purpose of this outpatient visit only. Please review this entire medication list with your pharmacist, primary care physician, and specialist(s). It is 7 Zabrina, Brotherton MRN: [id number] Acct#: [id number] your  responsibility to share any new medication instructions you received this visit with your doctor(s). Although no medicine is without risk, your healthcare provider today feels reasonable decisions were made concerning starting new medications and stopping or changing the dosages of your usual medications until you receive follow-up care. Take medications only as directed. Many medications can cause drowsiness, especially those for pain, anxiety, muscle spasms, nausea, and allergies. DO NOT drive, drink alcohol, operate power machinery, or participate in potentially dangerous activities if taking medicines that make you tired. Referral/Appointment Refer Patient To: Phone Number: Follow-up in Appointment Details: Physician Referral Service [phone] [facility] [phone] [facility], - Urology [phone] [facility], - Urology [phone] Lajuana Ripple [phone] Drug Instructions: gi antiemetic, pain acetaminophen oxycodone, pain nsaid motrin, urology bph (flomax) 02:24 05/02/2010 by Kennon Rounds - MD, Dr. Cindra Presume

## 2010-06-21 NOTE — Progress Notes (Signed)
Summary: Call A Nurse   Call-A-Nurse Triage Call Report Triage Record Num: [id number] Operator: Remonia Richter Patient Name: Linda Richard Call Date & Time: 05/01/2010 10:49:42PM Patient Phone: [phone] PCP: Patient Gender: Female PCP Fax : Patient DOB: 1951-02-15 Practice Name: [facility] Reason for Call: Julian/spouse: seen today for bladder infection and put on abx, now having pain in side and nausea; was peeing frequently and now having trouble going no answer to scheduled callback by RN, phone rang as fax line, number checked and correct, no contact Protocol(s) Used: No Contact or Duplicate Contact Calls (Adult) Recommended Outcome per Protocol: No Contact Reason for Outcome: Wrong number reached. Answering service notified. Care Advice:  ~ 05/01/2010 10:52:33PM Page 1 of 1 [email or web address]

## 2010-06-21 NOTE — Consult Note (Signed)
Summary: [facility]  [facility]   Imported By: Maryln Gottron 05/23/2010 15:40:21  _____________________________________________________________________  External Attachment:    Type:   Image     Comment:   External Document

## 2010-07-31 LAB — URINE MICROSCOPIC-ADD ON

## 2010-07-31 LAB — URINALYSIS, ROUTINE W REFLEX MICROSCOPIC
Bilirubin Urine: NEGATIVE
Glucose, UA: NEGATIVE mg/dL
Ketones, ur: NEGATIVE mg/dL
Leukocytes, UA: NEGATIVE
pH: 5 (ref 5.0–8.0)

## 2010-07-31 LAB — BASIC METABOLIC PANEL
CO2: 28 mEq/L (ref 19–32)
Chloride: 100 mEq/L (ref 96–112)
Creatinine, Ser: 0.85 mg/dL (ref 0.4–1.2)
GFR calc Af Amer: >60 mL/min (ref >60)
Glucose, Bld: 139 mg/dL — ABNORMAL HIGH (ref 70–99)

## 2010-07-31 LAB — DIFFERENTIAL
Eosinophils Absolute: 0.1 10*3/uL (ref 0.0–0.7)
Eosinophils Relative: 1 % (ref 0–5)
Lymphs Abs: 1.1 10*3/uL (ref 0.7–4.0)
Monocytes Relative: 7 % (ref 3–12)

## 2010-07-31 LAB — CBC
Hemoglobin: 14.4 g/dL (ref 12.0–15.0)
MCH: 31 pg (ref 26.0–34.0)
MCV: 89.2 fL (ref 78.0–100.0)
RBC: 4.65 MIL/uL (ref 3.87–5.11)

## 2010-07-31 LAB — URINE CULTURE
Colony Count: NO GROWTH
Culture  Setup Time: 201112140755

## 2010-10-02 NOTE — Assessment & Plan Note (Signed)
OFFICE VISIT   Linda Richard, Linda Richard  DOB:  1950-12-15                                       02/29/2008  ZOXWR#:[id number]   The patient returns 1 week post laser ablation right great saphenous  vein with multiple stab phlebectomies.  She has had an excellent early  result with minimal discomfort in the right thigh which has been well-  controlled with the ibuprofen.  She has had no distal edema and has had  some mild to moderate bruising as one would expect.  She has had no pain  at the stab phlebectomy sites.   Venous duplex exam today reveals complete closure of the right great  saphenous vein at the saphenofemoral junction with no evidence of deep  venous thrombosis.  She is very pleased and will return on November 9  for a similar procedure on the contralateral left leg.   Quita Skye Hart [place], M.D.  Electronically Signed   JDL/MEDQ  D:  02/29/2008  T:  03/01/2008  Job:  [id number]

## 2010-10-02 NOTE — Procedures (Signed)
DUPLEX DEEP VENOUS EXAM - LOWER EXTREMITY   INDICATION:  Follow up right greater saphenous vein ablation.   HISTORY:  Edema:  No.  Trauma/Surgery:  Yes.  Pain:  No.  PE:  No.  Previous DVT:  None.  Anticoagulants:  Other:   DUPLEX EXAM:                CFV   SFV   PopV  PTV    GSV                R  L  R  L  R  L  R   L  R  L  Thrombosis    o  o  o     o     o      +  Spontaneous   +  +  +     +     +      0  Phasic        +  +  +     +     +      0  Augmentation  +  +  +     +     +      0  Compressible  +  +  +     +     +      0  Competent     +  +  +     +     +      0   Legend:  + - yes  o - no  p - partial  D - decreased   IMPRESSION:  1. No evidence of deep venous thrombosis noted in the right leg.  2. The greater saphenous vein appears ablated from the proximal thigh      to the distal thigh.    _____________________________  Quita Skye. Hart [place], M.D.   MG/MEDQ  D:  02/29/2008  T:  02/29/2008  Job:  [id number]

## 2010-10-02 NOTE — Procedures (Signed)
DUPLEX DEEP VENOUS EXAM - LOWER EXTREMITY   INDICATION:  Follow-up post laser ablation of left greater saphenous  vein.   HISTORY:  Edema:  Left leg edema.  Trauma/Surgery:  Left greater saphenous vein laser ablation on March 28, 2008.  Right greater saphenous vein ablation on February 22, 2008.  Pain:  Left leg pain.  PE:  No.  Previous DVT:  No.  Anticoagulants:  No.  Other:  No.   DUPLEX EXAM:                CFV   SFV   PopV  PTV    GSV                R  L  R  L  R  L  R   L  R  L  Thrombosis    0  0     0     0      0  +  +  Spontaneous   +  +     +     +      +  0  0  Phasic        +  +     +     +      +  0  0  Augmentation  +  +     +     +      +  0  0  Compressible  +  +     +     +      +  0  0  Competent     +  +     +     +      +  0  0   Legend:  + - yes  o - no  p - partial  D - decreased   IMPRESSION:  1. Left and right greater saphenous veins are thrombosed and occluded      at the saphenofemoral junction throughout the vessels.  2. Left circumflex vein is patent.  3. No evidence of left leg DVT.    _____________________________  Quita Skye Hart [place], M.D.   MC/MEDQ  D:  04/12/2008  T:  04/12/2008  Job:  [id number]

## 2010-10-02 NOTE — Consult Note (Signed)
VASCULAR SURGERY CONSULTATION   KETZALY, CARDELLA M  DOB:  09-05-1950                                       10/06/2007  ZOXWR#:[id number]   The patient is being evaluated today for severe venous disease in both  lower extremities with a long history of previous treatment.  This  healthy 60 year old female was seen by me in the past.  She underwent  ligation and stripping of her left greater saphenous vein from the knee  to the distal thigh 1986 and in 1994 she had multiple stab phlebectomies  in the left leg and ligation and stripping of her distal right greater  saphenous vein for painful varicosities.  She had a history of  iliofemoral DVT on the left prior to either of these procedures and had  had some edema initially which resolved.  Following her second procedure  in 1994 she developed recurrent varicosities over the next 10 years and  in 2003 Dr. Marcy Panning performed what sounds like a TriVex procedure in  both legs for recurrent varicosities.  She rapidly developed recurrent  varicose veins and also developed very prominent varicosities in the  suprapubic region from the left to the right inguinal area.  She  currently has significant pain which she describes as aching, burning,  cramping and heaviness in both legs in the thighs and calf which has  been worsening recently.  Her swelling continues to be an issue with the  left slightly worse than the right with bulging of these varicosities.  She has no history of thrombophlebitis, stasis ulcers or bleeding but  does have this deep venous thrombosis episode in the early 1980s.  She  wears support hose but not elastic compression stockings and does  elevate her legs periodically and tried pain medication without relief.  It is affecting her daily living at the present time.   PAST MEDICAL HISTORY:  Is negative for diabetes, hypertension, coronary  artery disease, COPD or stroke.   FAMILY HISTORY:  Is  unremarkable.   SOCIAL HISTORY:  She is married and has two children.  She has not  smoked cigarettes in 30 years.  Drinks occasional alcohol.   ALLERGIES:  None.   MEDICATIONS:  None.   PHYSICAL EXAMINATION:  Vital signs:  Blood pressure is 155/82, heart  rate 76, respirations 12.  General:  She is a healthy-appearing middle-  aged female in no apparent stress, alert and oriented x3.  Neck:  Is  supple, 3+ carotid pulses palpable.  No bruits are audible.  Neurological:  Normal, no palpable adenopathy in the neck.  Chest:  Clear to auscultation.  Cardiovascular:  Regular rhythm with no murmurs.  Abdomen:  Soft, nontender with no palpable masses.  Vascular:  She has  3+ femoral, popliteal and dorsalis pedis pulses bilaterally.  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  On the right leg these are most  mostly in the medial thigh and posterior calf region extending into the  medial calf.  On the left side she has 1-2+ edema with varicosities in  the full thigh and medial calf region.  There is no hyperpigmentation or  ulceration noted.  She has prominent suprapubic collaterals  communicating between the left and right inguinal region.   Venous duplex exam was performed today with several findings:  1. The right greater saphenous vein has  gross reflux throughout down      to the distal thigh where it was previously removed.  2. Right small saphenous vein is normal.  3. The left great saphenous vein has reflux from the saphenofemoral      junction down to the distal thigh where it was previously removed.  4. 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  5. 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   I think she should be treated with elastic compression stockings,  analgesics, elevation and other  conservative measures for 3 months and  have her return to see if she has had any improvement of symptoms.  If  not then I think initially she should have laser ablation of her right  great saphenous vein with multiple stab phlebectomies to be followed by  the same procedure on the left side.  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  She will return in 3 months for further  evaluation.   Quita Skye Hart [place], M.D.  Electronically Signed  JDL/MEDQ  D:  10/06/2007  T:  10/07/2007  Job:  [id number]   cc:   Neta Mends. Fabian Sharp, MD

## 2010-10-02 NOTE — Procedures (Signed)
LOWER EXTREMITY VENOUS REFLUX EXAM   INDICATION:  Bilateral legs varicose vein with pain and swelling.   EXAM:  Using color-flow imaging and pulse Doppler spectral analysis, the  right and left common femoral vein, superficial femoral vein, popliteal,  posterior tibial, greater and lesser saphenous vein are evaluated.  There is evidence suggesting deep venous insufficiency in the left lower  extremity.   The right and left saphenofemoral junction is not competent.  The right  and left GSV is not competent with the caliber as described below.   The right and left proximal short saphenous vein demonstrates  competency.   GSV Diameter (used if found to be incompetent only)                                            Right    Left  Proximal Greater Saphenous Vein           0.66 cm  0.61 cm  Proximal-to-mid-thigh                     0.66 cm  0.61 cm  Mid thigh                                 0.48 cm  0.59 cm  Mid-distal thigh                          0.48 cm  0.59 cm  Distal thigh                              0.45 cm  0.65 cm  Knee                                      0.42 cm  0.65 cm   IMPRESSION:  1. Right and left greater saphenous vein reflux is identified with the      caliber ranging from on the right 0.42 to 1.06 and on the left 0.65      to 1.3 cm knee to groin.  2. The right and left greater saphenous. Vein is not aneurysmal.  3. The right and left greater saphenous vein is not tortuous.  4. The left deep venous system is not competent.  5. The right and left lesser saphenous vein is competent.  6. 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  7. 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   ___________________________________________  Quita Skye. Hart Rochester, M.D.   MG/MEDQ  D:  10/06/2007  T:  10/06/2007  Job:  [id number]

## 2010-10-02 NOTE — Assessment & Plan Note (Signed)
OFFICE VISIT   Linda Richard, Linda Richard  DOB:  1950/08/17                                       04/12/2008  JYNWG#:[id number]   This is an office visit.  The patient underwent laser ablation of the  left great saphenous vein with multiple stab phlebectomies 2 weeks ago.  She had some mild discomfort in the thigh and calf for the first 3-4  days which began improving and now has some minimal discomfort in the  calf area.  She has had no increasing swelling distally.  She has been  wearing her elastic compression stocking and takes the ibuprofen as  instructed.   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  She does have residual spider and reticular veins  particularly in the calf and ankle region which she would like treated  in the spring and she will be back in touch with Marisue Ivan at that time to  schedule this.  In general she is pleased with her laser ablation/stab  phlebectomy portion of the treatment.   Quita Skye Hart Rochester, M.D.  Electronically Signed   JDL/MEDQ  D:  04/12/2008  T:  04/13/2008  Job:  [id number]

## 2010-10-02 NOTE — Assessment & Plan Note (Signed)
OFFICE VISIT   Linda Richard, Linda Richard  DOB:  [birth year]                                       01/19/2008  YQMVH#:[id number]   The patient returns today for further evaluation of her venous  insufficiency which is causing significant discomfort in both lower  extremities.  She has worn elastic compression stockings (long leg 20 mm  - 30 mm) since I evaluated her in May and has also tried analgesics and  elevation as much as possible and has had no improvement in her  symptomatology.  Her symptoms are sometimes worse on the right and  sometimes worse on the left but affect her thigh and calves with aching  and throbbing discomfort and is affecting her daily living and her  ability to work.   PHYSICAL EXAMINATION:  On exam today she does have large bulbous  varicosities bilaterally in the medial thigh and calf regions and has 1+  edema in the left ankle, no edema on the right side.  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   I think that we should proceed with laser ablation of her right great  saphenous vein with multiple stab phlebectomies to be followed by laser  ablation of the left great saphenous vein with multiple stab  phlebectomies.  She does understand that this could cause aggravation of  the swelling in the left leg because of her old DVT, although I doubt  that this will happen.  The DVT was in 1983 and she does not have total  occlusion of her deep venous system on the left side.  We will proceed  with precertification for these procedures to relieve her symptoms.   Quita Skye Hart Rochester, M.D.  Electronically Signed   JDL/MEDQ  D:  01/19/2008  T:  01/20/2008  Job:  [id number]

## 2011-01-10 ENCOUNTER — Encounter: Payer: Self-pay | Admitting: Internal Medicine

## 2011-01-28 ENCOUNTER — Other Ambulatory Visit (INDEPENDENT_AMBULATORY_CARE_PROVIDER_SITE_OTHER): Payer: 59

## 2011-01-28 DIAGNOSIS — Z0000 Encounter for general adult medical examination without abnormal findings: Secondary | ICD-10-CM

## 2011-01-28 LAB — HEPATIC FUNCTION PANEL
ALT: 26 U/L (ref 0–35)
Albumin: 4.1 g/dL (ref 3.5–5.2)
Alkaline Phosphatase: 52 U/L (ref 39–117)
Total Protein: 7.2 g/dL (ref 6.0–8.3)

## 2011-01-28 LAB — CBC WITH DIFFERENTIAL/PLATELET
Basophils Absolute: 0 10*3/uL (ref 0.0–0.1)
Lymphocytes Relative: 25.5 % (ref 12.0–46.0)
Lymphs Abs: 1.3 10*3/uL (ref 0.7–4.0)
Monocytes Relative: 9.5 % (ref 3.0–12.0)
Platelets: 194 10*3/uL (ref 150.0–400.0)
RDW: 13.1 % (ref 11.5–14.6)

## 2011-01-28 LAB — POCT URINALYSIS DIPSTICK
Blood, UA: NEGATIVE
Protein, UA: NEGATIVE
Spec Grav, UA: 1.015
Urobilinogen, UA: 0.2
pH, UA: 7

## 2011-01-28 LAB — BASIC METABOLIC PANEL
BUN: 12 mg/dL (ref 6–23)
Calcium: 9.6 mg/dL (ref 8.4–10.5)
GFR: 174.04 mL/min (ref >60.00)
Glucose, Bld: 92 mg/dL (ref 70–99)

## 2011-01-28 LAB — LIPID PANEL
Cholesterol: 227 mg/dL — ABNORMAL HIGH (ref 0–200)
VLDL: 11.2 mg/dL (ref 0.0–40.0)

## 2011-01-29 LAB — LDL CHOLESTEROL, DIRECT: Direct LDL: 133.1 mg/dL

## 2011-02-04 ENCOUNTER — Ambulatory Visit (INDEPENDENT_AMBULATORY_CARE_PROVIDER_SITE_OTHER): Payer: 59 | Admitting: Internal Medicine

## 2011-02-04 ENCOUNTER — Encounter: Payer: Self-pay | Admitting: Internal Medicine

## 2011-02-04 VITALS — BP 124/80 | HR 60 | Temp 98.1°F | Resp 14 | Ht 69.0 in | Wt 131.0 lb

## 2011-02-04 DIAGNOSIS — I8390 Asymptomatic varicose veins of unspecified lower extremity: Secondary | ICD-10-CM | POA: Insufficient documentation

## 2011-02-04 DIAGNOSIS — M549 Dorsalgia, unspecified: Secondary | ICD-10-CM

## 2011-02-04 DIAGNOSIS — I872 Venous insufficiency (chronic) (peripheral): Secondary | ICD-10-CM

## 2011-02-04 DIAGNOSIS — O2230 Deep phlebothrombosis in pregnancy, unspecified trimester: Secondary | ICD-10-CM | POA: Insufficient documentation

## 2011-02-04 DIAGNOSIS — Z23 Encounter for immunization: Secondary | ICD-10-CM

## 2011-02-04 DIAGNOSIS — Z0000 Encounter for general adult medical examination without abnormal findings: Secondary | ICD-10-CM

## 2011-02-04 NOTE — Progress Notes (Signed)
  Subjective:    Patient ID: Linda Richard, female    DOB: [birth year], 60 y.o.   MRN: [id number]  HPI Patient comes in today for Preventive Health Care visit  Since her last visit she is doing well had renal stone last year no current problem Had echo test  Currently feels well .Legs ok   And back controlled but inversion machine.    Review of Systems May : had  Weeks f painful rash right abd ? If was shingles ROS:  GEN/ HEENTNo fever, significant weight changes sweats headaches vision problems hearing changes, CV/ PULM; No chest pain shortness of breath cough, syncope,edema  change in exercise tolerance. GI /GU: No adominal pain, vomiting, change in bowel habits. No blood in the stool. No significant GU symptoms. SKIN/HEME: ,no acute skin rashes suspicious lesions or bleeding. No lymphadenopathy, nodules, masses.  NEURO/ PSYCH:  No neurologic signs such as weakness numbness No depression anxiety. IMM/ Allergy: No unusual infections.   REST of 12 system review negative  Past history family history social history reviewed in the electronic medical record.   Past history family history social history reviewed in the electronic medical record.      Objective:   Physical Exam Physical Exam: Vital signs reviewed ZHY:QMVH is a well-developed well-nourished alert cooperative  white female who appears her stated age in no acute distress.  HEENT: normocephalic  traumatic , Eyes: PERRL EOM's full, conjunctiva clear, Nares: paten,t no deformity discharge or tenderness., Ears: no deformity EAC's clear TMs with normal landmarks. Mouth: clear OP, no lesions, edema.  Moist mucous membranes. Dentition in adequate repair. NECK: supple without masses, thyromegaly or bruits. CHEST/PULM:  Clear to auscultation and percussion breath sounds equal no wheeze , rales or rhonchi. No chest wall deformities or tenderness. CV: PMI is nondisplaced, S1 S2 no gallops, murmurs, rubs. Peripheral pulses are full  without delay.No JVD . dont hear click today Breast: normal by inspection . No dimpling, discharge, masses, tenderness or discharge . LN: no cervical axillary inguinal adenopathy  ABDOMEN: Bowel sounds normal nontender  No guard or rebound, no hepato splenomegaly no CVA tenderness.  No hernia. Extremtities:  No clubbing cyanosis or edema, no acute joint swelling or redness no focal atrophy. Varicose veins   Not active  No edema or ulcerations NEURO:  Oriented x3, cranial nerves 3-12 appear to be intact, no obvious focal weakness,gait within normal limits no abnormal reflexes or asymmetrical SKIN: No acute rashes normal turgor, color, no bruising or petechiae. PSYCH: Oriented, good eye contact, no obvious depression anxiety, cognition and judgment appear normal. Labs reviewed with patient. Reviewed echo from last year   Mild ? lvh some sclerosis  No stenosis  Or valvular disease Labs reviewed with patient.     Assessment & Plan:  Preventive Health Care Counseled regarding healthy nutrition, exercise, sleep, injury prevention, calcium vit d and healthy weight . Disc immunizations  Flu shot today  Get zostavax at 62  VV stable Back pain stable  uses inversion therapy Hx of renal stones no recurrence   Disc  Echo reviewed for click heard at last visit .

## 2011-02-04 NOTE — Patient Instructions (Signed)
Continue lifestyle intervention healthy eating and exercise . Recheck yearly check. Get shingles vaccine when 62.

## 2011-02-04 NOTE — Assessment & Plan Note (Signed)
Stable  Doing well with physical modalities.

## 2011-06-21 ENCOUNTER — Encounter: Payer: Self-pay | Admitting: Gastroenterology

## 2011-08-26 ENCOUNTER — Encounter: Payer: Self-pay | Admitting: Internal Medicine

## 2011-08-26 ENCOUNTER — Ambulatory Visit (INDEPENDENT_AMBULATORY_CARE_PROVIDER_SITE_OTHER): Payer: 59 | Admitting: Internal Medicine

## 2011-08-26 VITALS — BP 122/80 | HR 84 | Temp 98.6°F | Wt 132.0 lb

## 2011-08-26 DIAGNOSIS — J029 Acute pharyngitis, unspecified: Secondary | ICD-10-CM

## 2011-08-26 DIAGNOSIS — J060 Acute laryngopharyngitis: Secondary | ICD-10-CM

## 2011-08-26 MED ORDER — HYDROCODONE-HOMATROPINE 5-1.5 MG/5ML PO SYRP: 5.0000 mL | ORAL_SOLUTION | ORAL | Status: AC | PRN

## 2011-08-26 NOTE — Patient Instructions (Addendum)
This appears to be a viral respiratory infection.  Advise relative voice rest   Humidity  Cough med as tolerated. May take a few weeks for cough to go away . Call if fever shortness of breath .   Laryngitis At the top of your windpipe is your voice box. It is the source of your voice. Inside your voice box are 2 bands of muscles called vocal cords. When you breathe, your vocal cords are relaxed and open so that air can get into the lungs. When you decide to say something, these cords come together and vibrate. The sound from these vibrations goes into your throat and comes out through your mouth as sound. Laryngitis is an inflammation of the vocal cords that causes hoarseness, cough, loss of voice, sore throat, and dry throat. Laryngitis can be temporary (acute) or long-term (chronic). Most cases of acute laryngitis improve with time.Chronic laryngitis lasts for more than 3 weeks. CAUSES Laryngitis can often be related to excessive smoking, talking, or yelling, as well as inhalation of toxic fumes and allergies. Acute laryngitis is usually caused by a viral infection, vocal strain, measles or mumps, or bacterial infections. Chronic laryngitis is usually caused by vocal cord strain, vocal cord injury, postnasal drip, growths on the vocal cords, or acid reflux. SYMPTOMS   Cough.   Sore throat.   Dry throat.  RISK FACTORS  Respiratory infections.   Exposure to irritating substances, such as cigarette smoke, excessive amounts of alcohol, stomach acids, and workplace chemicals.   Voice trauma, such as vocal cord injury from shouting or speaking too loud.  DIAGNOSIS  Your cargiver will perform a physical exam. During the physical exam, your caregiver will examine your throat. The most common sign of laryngitis is hoarseness. Laryngoscopy may be necessary to confirm the diagnosis of this condition. This procedure allows your caregiver to look into the larynx. HOME CARE INSTRUCTIONS  Drink enough  fluids to keep your urine clear or pale yellow.   Rest until you no longer have symptoms or as directed by your caregiver.   Breathe in moist air.   Take all medicine as directed by your caregiver.   Do not smoke.   Talk as little as possible (this includes whispering).   Write on paper instead of talking until your voice is back to normal.   Follow up with your caregiver if your condition has not improved after 10 days.  SEEK MEDICAL CARE IF:   You have trouble breathing.   You cough up blood.   You have persistent fever.   You have increasing pain.   You have difficulty swallowing.  MAKE SURE YOU:  Understand these instructions.   Will watch your condition.   Will get help right away if you are not doing well or get worse.  Document Released: 05/06/2005 Document Revised: 04/25/2011 Document Reviewed: 07/12/2010 [facility] Patient Information 2012 [place], [place].

## 2011-08-26 NOTE — Progress Notes (Signed)
  Subjective:    Patient ID: Linda Richard, female    DOB: 1950-06-02, 61 y.o.   MRN: [id number]  HPI  Patient comes in today for SDA for  new problem evaluation.  onset 4 days ago  with st scratchy  and laryngitis. But got terribly worse over the weekend.  And throat hurts very badly.  Nofever.  Able to swallow  But   Feels badly. No cp sob but some cough at night and hoarse  Review of Systems No fever ha watery eyes no gi gu sx  Husband had sore throat but not as bad  Grand children  Have had norovirus no recent exposures.     Objective:   Physical Exam BP 122/80  Pulse 84  Temp(Src) 98.6 F (37 C) (Oral)  Wt 132 lb (59.875 kg)  SpO2 98% WDWN in NAD  quiet respirations; mildly congested  Moderately  hoarse. Non toxic . HEENT: Normocephalic ;atraumatic , Eyes;  PERRL, EOMs  Full, lids and conjunctiva clear,,Ears: no deformities, canals nl, TM landmarks normal, Nose: no deformity or discharge but congested;face minimally tender Mouth : OP post pharyngeal area is beefy red  Palate clear no lesion or edema  Neck: Supple without adenopathy or masses or bruits Chest:  Clear to A&P without wheezes rales or rhonchi CV:  S1-S2 no gallops or murmurs peripheral perfusion is normal Skin :nl perfusion and no acute rashes  RS neg     Assessment & Plan:   Acute pharyngeal LTB illness  ( seen in community   Viral most likely )    Sx rx voice rest fluids   Cough med for comfort.     Expectant management.  Contact us with alarm features  Or prn.

## 2011-12-31 ENCOUNTER — Encounter: Payer: Self-pay | Admitting: Family Medicine

## 2011-12-31 ENCOUNTER — Ambulatory Visit (INDEPENDENT_AMBULATORY_CARE_PROVIDER_SITE_OTHER): Payer: 59 | Admitting: Family Medicine

## 2011-12-31 ENCOUNTER — Telehealth: Payer: Self-pay | Admitting: Internal Medicine

## 2011-12-31 VITALS — BP 132/84 | Temp 98.3°F | Wt 130.0 lb

## 2011-12-31 DIAGNOSIS — A938 Other specified arthropod-borne viral fevers: Secondary | ICD-10-CM

## 2011-12-31 MED ORDER — DOXYCYCLINE HYCLATE 100 MG PO CAPS: 100.0000 mg | ORAL_CAPSULE | Freq: Two times a day (BID) | ORAL | Status: AC

## 2011-12-31 NOTE — Telephone Encounter (Signed)
Caller: Shrita/Patient; Patient Name: Linda Richard; PCP: Madelin Headings.; Best Callback Phone Number: [phone].   States pulled tick off skin approximately 2 weeks ago  and this a.m. woke with chills and oral temp. 100.   "I am achy all over and just dont feel good."  No rash at bite site or on any other parts of body.  States was given appointment by office at 1600 today with Dr. Abran Cantor.  Patient advised to keep appointment time.

## 2011-12-31 NOTE — Progress Notes (Signed)
  Subjective:    Patient ID: Linda Richard, female    DOB: [birth year], 61 y.o.   MRN: [id number]  HPI Here for the onset last night of a mild HA, fever to 100 degrees, and diffuse muscle aches. No rashes. No ST or cough. No NVD. About 2 weeks ago she pulled a tick out of her skin on the right hip.    Review of Systems  Constitutional: Positive for fever.  Eyes: Negative.   Respiratory: Negative.   Cardiovascular: Negative.   Gastrointestinal: Negative.   Genitourinary: Negative.   Musculoskeletal: Positive for myalgias.  Skin: Negative.   Neurological: Positive for headaches. Negative for dizziness, tremors, seizures, syncope, speech difficulty, weakness, light-headedness and numbness.  Hematological: Negative.        Objective:   Physical Exam  Constitutional: She is oriented to person, place, and time. She appears well-developed and well-nourished.  Neck: No thyromegaly present.  Cardiovascular: Normal rate, regular rhythm, normal heart sounds and intact distal pulses.   Pulmonary/Chest: Effort normal and breath sounds normal.  Lymphadenopathy:    She has no cervical adenopathy.  Neurological: She is alert and oriented to person, place, and time.  Skin: Skin is warm and dry. No rash noted. No erythema. No pallor.       Tiny bite mark on the right anterior hip          Assessment & Plan:  Possible tick fever. Cover with Doxycycline. Add Advil prn

## 2012-01-16 ENCOUNTER — Encounter: Payer: Self-pay | Admitting: Internal Medicine

## 2012-02-12 ENCOUNTER — Encounter: Payer: 59 | Admitting: Internal Medicine

## 2012-02-19 ENCOUNTER — Other Ambulatory Visit (INDEPENDENT_AMBULATORY_CARE_PROVIDER_SITE_OTHER): Payer: 59

## 2012-02-19 DIAGNOSIS — Z0000 Encounter for general adult medical examination without abnormal findings: Secondary | ICD-10-CM

## 2012-02-19 LAB — HEPATIC FUNCTION PANEL
Bilirubin, Direct: 0.1 mg/dL (ref 0.0–0.3)
Total Bilirubin: 0.8 mg/dL (ref 0.3–1.2)

## 2012-02-19 LAB — BASIC METABOLIC PANEL
BUN: 12 mg/dL (ref 6–23)
CO2: 30 mEq/L (ref 19–32)
Calcium: 9.4 mg/dL (ref 8.4–10.5)
Chloride: 104 mEq/L (ref 96–112)
Creatinine, Ser: 0.4 mg/dL (ref 0.4–1.2)
GFR: 173.4 mL/min (ref >60.00)
Glucose, Bld: 107 mg/dL — ABNORMAL HIGH (ref 70–99)
Potassium: 4 mEq/L (ref 3.5–5.1)
Sodium: 141 mEq/L (ref 135–145)

## 2012-02-19 LAB — POCT URINALYSIS DIPSTICK
Bilirubin, UA: NEGATIVE
Ketones, UA: NEGATIVE
Leukocytes, UA: NEGATIVE
Nitrite, UA: NEGATIVE
Protein, UA: NEGATIVE

## 2012-02-19 LAB — LIPID PANEL
Cholesterol: 205 mg/dL — ABNORMAL HIGH (ref 0–200)
HDL: 84.8 mg/dL (ref >39.00)
Total CHOL/HDL Ratio: 2
Triglycerides: 56 mg/dL (ref 0.0–149.0)
VLDL: 11.2 mg/dL (ref 0.0–40.0)

## 2012-02-19 LAB — CBC WITH DIFFERENTIAL/PLATELET
Basophils Absolute: 0 10*3/uL (ref 0.0–0.1)
Basophils Relative: 0.5 % (ref 0.0–3.0)
Eosinophils Absolute: 0.2 10*3/uL (ref 0.0–0.7)
Eosinophils Relative: 4.1 % (ref 0.0–5.0)
HCT: 41.5 % (ref 36.0–46.0)
Hemoglobin: 13.8 g/dL (ref 12.0–15.0)
Lymphocytes Relative: 20.6 % (ref 12.0–46.0)
Lymphs Abs: 1.2 10*3/uL (ref 0.7–4.0)
MCHC: 33.3 g/dL (ref 30.0–36.0)
MCV: 91.6 fl (ref 78.0–100.0)
Monocytes Absolute: 0.6 10*3/uL (ref 0.1–1.0)
Monocytes Relative: 10.1 % (ref 3.0–12.0)
Neutro Abs: 3.6 10*3/uL (ref 1.4–7.7)
Neutrophils Relative %: 64.7 % (ref 43.0–77.0)
Platelets: 214 10*3/uL (ref 150.0–400.0)
RBC: 4.53 Mil/uL (ref 3.87–5.11)
RDW: 13.7 % (ref 11.5–14.6)
WBC: 5.6 10*3/uL (ref 4.5–10.5)

## 2012-02-19 LAB — TSH: TSH: 1.41 u[IU]/mL (ref 0.35–5.50)

## 2012-02-19 LAB — LDL CHOLESTEROL, DIRECT: Direct LDL: 100.6 mg/dL

## 2012-02-26 ENCOUNTER — Encounter: Payer: Self-pay | Admitting: Internal Medicine

## 2012-02-26 ENCOUNTER — Ambulatory Visit (INDEPENDENT_AMBULATORY_CARE_PROVIDER_SITE_OTHER): Payer: 59 | Admitting: Internal Medicine

## 2012-02-26 VITALS — BP 150/100 | HR 89 | Temp 98.3°F | Ht 68.5 in | Wt 126.0 lb

## 2012-02-26 DIAGNOSIS — R3129 Other microscopic hematuria: Secondary | ICD-10-CM

## 2012-02-26 DIAGNOSIS — R030 Elevated blood-pressure reading, without diagnosis of hypertension: Secondary | ICD-10-CM

## 2012-02-26 DIAGNOSIS — I872 Venous insufficiency (chronic) (peripheral): Secondary | ICD-10-CM

## 2012-02-26 DIAGNOSIS — Z87442 Personal history of urinary calculi: Secondary | ICD-10-CM | POA: Insufficient documentation

## 2012-02-26 DIAGNOSIS — R7309 Other abnormal glucose: Secondary | ICD-10-CM

## 2012-02-26 DIAGNOSIS — R350 Frequency of micturition: Secondary | ICD-10-CM

## 2012-02-26 DIAGNOSIS — R739 Hyperglycemia, unspecified: Secondary | ICD-10-CM

## 2012-02-26 DIAGNOSIS — I8390 Asymptomatic varicose veins of unspecified lower extremity: Secondary | ICD-10-CM

## 2012-02-26 DIAGNOSIS — Z0000 Encounter for general adult medical examination without abnormal findings: Secondary | ICD-10-CM

## 2012-02-26 LAB — POCT URINALYSIS DIPSTICK
Bilirubin, UA: NEGATIVE
Ketones, UA: NEGATIVE
Leukocytes, UA: NEGATIVE
Protein, UA: NEGATIVE
Spec Grav, UA: 1.025

## 2012-02-26 LAB — HEMOGLOBIN A1C: Hgb A1c MFr Bld: 5.3 % (ref 4.6–6.5)

## 2012-02-26 LAB — GLUCOSE, RANDOM: Glucose, Bld: 100 mg/dL — ABNORMAL HIGH (ref 70–99)

## 2012-02-26 MED ORDER — FESOTERODINE FUMARATE ER 4 MG PO TB24: 4.0000 mg | ORAL_TABLET | Freq: Every day | ORAL | Status: DC

## 2012-02-26 NOTE — Progress Notes (Signed)
Subjective:    Patient ID: Linda Richard, female    DOB: 10/05/1950, 61 y.o.   MRN: [id number]  HPI Patient comes in today for Preventive Health Care visit  due for colonoscopy 2013 is up-to-date on Pap smear and mammogram. Okay for flu shot today Since her last visit she is generally been well however over the last couple months she is having increasing nocturia urinary frequency that is interrupting her sleep. She denies polyuria polydipsia as a cause. No change in her diet is physically active no dysuria had a normal GYN check up about 4-6 months ago. No gross hematuria. Her back is been pretty stable as well as her varicose veins.  She did fall off a ladder recently not caused by dizziness or cardiovascular symptoms  3rd step.   Tailbone has been sore but is much better.    Review of Systems ROS:  GEN/ HEENT: No fever, significant weight changes sweats headaches vision problems hearing changes, CV/ PULM; No chest pain shortness of breath cough, syncope,edema  change in exercise tolerance. GI /GU: No adominal pain, vomiting, change in bowel habits. No blood in the stool.   SKIN/HEME: ,no acute skin rashes suspicious lesions or bleeding. No lymphadenopathy, nodules, masses.  NEURO/ PSYCH:  No neurologic signs such as weakness numbness. No depression anxiety. IMM/ Allergy: No unusual infections.  Allergy .   REST of 12 system review negative except as per HPI  Past history family history social history reviewed in the electronic medical record.    Objective:   Physical Exam BP 150/100  Pulse 89  Temp 98.3 F (36.8 C) (Oral)  Ht 5' 8.5" (1.74 m)  Wt 126 lb (57.153 kg)  BMI 18.88 kg/m2  SpO2 98% Physical Exam: Repeat blood pressure readings 142/82 and 140/90 regular cuff sitting. Vital signs reviewed [id number]:NGEX is a well-developed well-nourished alert cooperative slender  white female who appears her stated age in no acute distress.  HEENT: normocephalic atraumatic , Eyes: PERRL  EOM's full, conjunctiva clear, Nares: paten,t no deformity discharge or tenderness., Ears: no deformity EAC's clear TMs with normal landmarks. Mouth: clear OP, no lesions, edema.  Moist mucous membranes. Dentition in adequate repair. NECK: supple without masses, thyromegaly or bruits. CHEST/PULM:  Clear to auscultation and percussion breath sounds equal no wheeze , rales or rhonchi. No chest wall deformities or tenderness. Breast: normal by inspection . No dimpling, discharge, masses, tenderness or discharge . CV: PMI is nondisplaced, S1 S2 no gallops, murmurs, rubs. Peripheral pulses are full without delay.No JVD .  ABDOMEN: Bowel sounds normal nontender  No guard or rebound, no hepato splenomegal no CVA tenderness.  No hernia. Extremtities:  No clubbing cyanosis or edema, no acute joint swelling or redness no focal atrophy some venous varicosities no ulcers or bleeding. NEURO:  Oriented x3, cranial nerves 3-12 appear to be intact, no obvious focal weakness,gait within normal limits no abnormal reflexes or asymmetrical SKIN: No acute rashes normal turgor, color, no bruising or petechiae. PSYCH: Oriented, good eye contact, no obvious depression anxiety, cognition and judgment appear normal. LN: no cervical axillary inguinal adenopathy    Lab Results  Component Value Date   WBC 5.6 02/19/2012   HGB 13.8 02/19/2012   HCT 41.5 02/19/2012   PLT 214.0 02/19/2012   GLUCOSE 107* 02/19/2012   CHOL 205* 02/19/2012   TRIG 56.0 02/19/2012   HDL 84.80 02/19/2012   LDLDIRECT 100.6 02/19/2012   ALT 26 02/19/2012   AST 30 02/19/2012   NA 141  02/19/2012   K 4.0 02/19/2012   CL 104 02/19/2012   CREATININE 0.4 02/19/2012   BUN 12 02/19/2012   CO2 30 02/19/2012   TSH 1.41 02/19/2012   MICROALBUR 2.0* 01/24/2009       Assessment & Plan:   Preventive Health Care Counseled regarding healthy nutrition, exercise, sleep, injury prevention, calcium vit d and healthy weight . BG fasting  No dm   Get a1c and random today (  had oatmeal) r/o  Cause of inc frequency doubt this .  BP reading elevated  ; bp   In brother   repeat coming down  May be office related  Check readings and fu if pop  Urinary frequency    No incontinence.  Mostly nocturnal hx of stone x 1   Check pth  B r/o infection  uro consult. Samples of Gala Murdoch  Given today .   Expectant management. Hx or renal stone x 1 \ VV stable

## 2012-02-26 NOTE — Patient Instructions (Signed)
Check labs today and let you know the results to look for reasons for your increasing urination. A diabetes check and a parathyroid hormone check  You have a small amount of blood in the urine today probably not an infection.  Can try samples for hyperirritable bladder and we will send you to urologic consultation.  Check your blood pressure readings at home about 3 times a week if they are below 140/90 just periodically. Your second blood pressure readings were in the 140/80 range

## 2012-02-27 LAB — PTH, INTACT AND CALCIUM
Calcium, Total (PTH): 10.5 mg/dL (ref 8.4–10.5)
PTH: 15.3 pg/mL (ref 14.0–72.0)

## 2012-03-02 ENCOUNTER — Telehealth: Payer: Self-pay | Admitting: Internal Medicine

## 2012-03-02 NOTE — Telephone Encounter (Signed)
Spoke to the pt.  When here is the office last week she did not understand what WP was explaining to her (labs).  She does not understand why she has to have so many labs drawn.  I gave her the results of the new test.  All were normal.  She is seeing urology on 03-06-12.

## 2012-03-02 NOTE — Telephone Encounter (Signed)
Pt is very unhappy feel that things are not being explained to her well. Pt requesting a call back about her labs

## 2013-01-08 LAB — HM MAMMOGRAPHY: HM Mammogram: NEGATIVE

## 2013-01-11 ENCOUNTER — Encounter: Payer: Self-pay | Admitting: Internal Medicine

## 2013-01-28 ENCOUNTER — Encounter: Payer: Self-pay | Admitting: Internal Medicine

## 2013-02-23 ENCOUNTER — Other Ambulatory Visit (INDEPENDENT_AMBULATORY_CARE_PROVIDER_SITE_OTHER): Payer: 59

## 2013-02-23 DIAGNOSIS — R7989 Other specified abnormal findings of blood chemistry: Secondary | ICD-10-CM

## 2013-02-23 DIAGNOSIS — Z0000 Encounter for general adult medical examination without abnormal findings: Secondary | ICD-10-CM

## 2013-02-23 LAB — CBC WITH DIFFERENTIAL/PLATELET
Basophils Relative: 0.5 % (ref 0.0–3.0)
Eosinophils Relative: 5 % (ref 0.0–5.0)
Lymphocytes Relative: 26.2 % (ref 12.0–46.0)
MCV: 89.9 fl (ref 78.0–100.0)
Monocytes Absolute: 0.5 10*3/uL (ref 0.1–1.0)
Monocytes Relative: 9.5 % (ref 3.0–12.0)
Neutrophils Relative %: 58.8 % (ref 43.0–77.0)
Platelets: 205 10*3/uL (ref 150.0–400.0)
RBC: 4.97 Mil/uL (ref 3.87–5.11)
WBC: 5.6 10*3/uL (ref 4.5–10.5)

## 2013-02-23 LAB — LDL CHOLESTEROL, DIRECT: Direct LDL: 128.8 mg/dL

## 2013-02-23 LAB — HEPATIC FUNCTION PANEL
ALT: 19 U/L (ref 0–35)
Alkaline Phosphatase: 45 U/L (ref 39–117)
Bilirubin, Direct: 0 mg/dL (ref 0.0–0.3)
Total Bilirubin: 0.7 mg/dL (ref 0.3–1.2)
Total Protein: 7.1 g/dL (ref 6.0–8.3)

## 2013-02-23 LAB — BASIC METABOLIC PANEL
BUN: 13 mg/dL (ref 6–23)
Calcium: 9.8 mg/dL (ref 8.4–10.5)
Chloride: 104 mEq/L (ref 96–112)
Creatinine, Ser: 0.4 mg/dL (ref 0.4–1.2)
GFR: 158.97 mL/min (ref >60.00)

## 2013-02-23 LAB — LIPID PANEL
Cholesterol: 242 mg/dL — ABNORMAL HIGH (ref 0–200)
Total CHOL/HDL Ratio: 2
VLDL: 10.4 mg/dL (ref 0.0–40.0)

## 2013-03-02 ENCOUNTER — Encounter: Payer: Self-pay | Admitting: Internal Medicine

## 2013-03-02 ENCOUNTER — Ambulatory Visit (INDEPENDENT_AMBULATORY_CARE_PROVIDER_SITE_OTHER): Payer: 59 | Admitting: Internal Medicine

## 2013-03-02 VITALS — BP 140/100 | HR 76 | Temp 98.0°F | Ht 68.25 in | Wt 127.0 lb

## 2013-03-02 DIAGNOSIS — Z23 Encounter for immunization: Secondary | ICD-10-CM

## 2013-03-02 DIAGNOSIS — Z0000 Encounter for general adult medical examination without abnormal findings: Secondary | ICD-10-CM | POA: Insufficient documentation

## 2013-03-02 DIAGNOSIS — Z2911 Encounter for prophylactic immunotherapy for respiratory syncytial virus (RSV): Secondary | ICD-10-CM

## 2013-03-02 NOTE — Patient Instructions (Signed)
Get dexa scan  (Bone density)   Next year 2015 .  Call ahead  For order.  Continue lifestyle intervention healthy eating and exercise . 150 minutes of exercise weeks  , bmI below 30 ;25 optimum . Avoid trans fats and processed foods;  Increase fresh fruits and veges to 5 servings per day. And avoid sweet beverages  Including tea and juice.  Optimize calcium vit  D 800- 1000 iu per day in diet or foods.   Bone Health Our bones do many things. They provide structure, protect organs, anchor muscles, and store calcium. Adequate calcium in your diet and weight-bearing physical activity help build strong bones, improve bone amounts, and may reduce the risk of weakening of bones (osteoporosis) later in life. PEAK BONE MASS By age 35, the average woman has acquired most of her skeletal bone mass. A large decline occurs in older adults which increases the risk of osteoporosis. In women this occurs around the time of menopause. It is important for young girls to reach their peak bone mass in order to maintain bone health throughout life. A person with high bone mass as a young adult will be more likely to have a higher bone mass later in life. Not enough calcium consumption and physical activity early on could result in a failure to achieve optimum bone mass in adulthood. OSTEOPOROSIS Osteoporosis is a disease of the bones. It is defined as low bone mass with deterioration of bone structure. Osteoporosis leads to an increase risk of fractures with falls. These fractures commonly happen in the wrist, hip, and spine. While men and women of all ages and background can develop osteoporosis, some of the risk factors for osteoporosis are:  Female.  White.  Postmenopausal.  Older adults.  Small in body size.  Eating a diet low in calcium.  Physically inactive.  Smoking.  Use of some medications such as prednisone.  Family history. CALCIUM Calcium is a mineral needed by the body for healthy bones,  teeth, and proper function of the heart, muscles, and nerves. The body cannot produce calcium so it must be absorbed through food. Good sources of calcium include:  Dairy products (low fat or nonfat milk, cheese, and yogurt).  Dark green leafy vegetables (bok choy and broccoli).  Calcium fortified foods (orange juice, cereal, bread, soy beverages, and tofu products).  Nuts (almonds). Recommended amounts of calcium vary for individuals. Below is a guide for how much calcium you should take in daily, as outlined by the [employer]. RECOMMENDED CALCIUM INTAKES Age and Amount in mg per day  Birth - 6 months...........210 mg  6 months - 1 year.........270 mg  1 - 3 years....................500 mg  4 - 8 years....................800 mg  9 - 13 years................1300 mg  14 - 18 years..............1300 mg  19 - 30 years..............1000 mg  31 - 50 years..............1000 mg  51 - 70 years..............1200 mg  70 or older.................1200 mg  Pregnant & lactating...1000 mg  14 - 18 years..............1300 mg  19 - 50 years..............1000 mg Vitamin D also plays an important role in healthy bone development. Vitamin D helps in the absorption of calcium (this is why milk is fortified with vitamin D). For more information on calcium and children visit the [employer] of Child Health and Merchandiser, retail (NICHD) Website at [email or web address]. WEIGHT-BEARING PHYSICAL ACTIVITY Regular physical activity has many positive health benefits. Benefits include strong bones. Weight-bearing physical activity early in life is important in reaching peak bone mass. Weight-bearing physical activities  cause muscles and bones to work against gravity. Some examples of weight bearing physical activities include:  Walking, jogging, or running.  [employer].  Jumping rope.  Dancing.  Soccer.  Tennis or Racquetball.  Stair  climbing.  Basketball.  Hiking.  Weight lifting.  Aerobic fitness classes. Including weight-bearing physical activity into an exercise plan is a great way to keep bones healthy and meet physical activity recommendations set forth in the Dietary Guidelines for Americans. Adults: Engage in at least 30 minutes of moderate physical activity on most, preferably all, days of the week. Children: Engage in at least 60 minutes of moderate physical activity on most, preferably all, days of the week. FOR MORE INFORMATION [place] [employer], [employer]ographer for [employer] and Promotion: www.cnpp.usda.gov National Osteoporosis Foundation: [email or web address] Document Released: 07/27/2003 Document Revised: 07/29/2011 Document Reviewed: 10/26/2008 [facility] Patient Information 2014 [place], [place].

## 2013-03-02 NOTE — Progress Notes (Signed)
Chief Complaint  Patient presents with  . Annual Exam    HPI: Patient comes in today for Preventive Health Care visit   No major change in health status since last visit . No ptassium supp .  Stopped the [employer] and sent off and  Trained sell and hasn't helped her bladder .   Didn't go back for follow up.   Taking probiotic  Helps stomach.  Maybe.  Eyes ok.  dexa last 2011 not bad Feels well physically  Active   ROS:  GEN/ HEENT: No fever, significant weight changes sweats headaches vision problems hearing changes, CV/ PULM; No chest pain shortness of breath cough, syncope,edema  change in exercise tolerance. GI /GU: No adominal pain, vomiting, change in bowel habits. No blood in the stool. No significant GU symptoms. SKIN/HEME: ,no acute skin rashes suspicious lesions or bleeding. No lymphadenopathy, nodules, masses.  NEURO/ PSYCH:  No neurologic signs such as weakness numbness. No depression anxiety. IMM/ Allergy: No unusual infections.  Allergy .   REST of 12 system review negative except as per HPI   Past Medical History  Diagnosis Date  . DVT (deep vein thrombosis) in pregnancy   . Varicose veins     strip and laser  . History of renal stone     2011  . Normal echocardiogram 2011    mild lvh ? aortic sclerosis no as      Family History  Problem Relation Age of Onset  . Hypertension    . Kidney disease      renal stones  . Arthritis    . Cancer Father     bladder   41s   . Ovarian cancer Mother     80s  . Other      son age 49 irreg HB  had cardioversion   . Thyroid disease      History   Social History  . Marital Status: Married    Spouse Name: N/A    Number of Children: N/A  . Years of Education: N/A   Social History Main Topics  . Smoking status: Never Smoker   . Smokeless tobacco: None  . Alcohol Use: Yes  . Drug Use: No  . Sexual Activity: None   Other Topics Concern  . None   Social History Narrative   HH of 2    Grandchild  Sons     reitred [employer] Child Support   Married.    No  ets tobacco No  Exercise restriction   etoh 2 x per week.    Exercise good.       G3P2    Outpatient Encounter Prescriptions as of 03/02/2013  Medication Sig Dispense Refill  . aspirin 81 MG tablet Take 81 mg by mouth daily.        . calcium-vitamin D (OSCAL WITH D) 500-200 MG-UNIT per tablet Take 1 tablet by mouth daily.        . cholecalciferol (VITAMIN D) 1000 UNITS tablet Take 1,000 Units by mouth daily.        . Multiple Vitamins-Minerals (ONE DAILY WOMENS 50+ PO) Take 1 tablet by mouth daily.      . Probiotic Product (PROBIOTIC DAILY PO) Take by mouth.      . vitamin E (VITAMIN E) 400 UNIT capsule Take 400 Units by mouth daily.        . [DISCONTINUED] fesoterodine (TOVIAZ) 4 MG TB24 Take 1 tablet (4 mg total) by mouth daily.  28 tablet  0  No facility-administered encounter medications on file as of 03/02/2013.    EXAM:  BP 140/100  Pulse 76  Temp(Src) 98 F (36.7 C) (Oral)  Ht 5' 8.25" (1.734 m)  Wt 127 lb (57.607 kg)  BMI 19.16 kg/m2  SpO2 98%  Body mass index is 19.16 kg/(m^2).  Physical Exam: Vital signs reviewed [id number] is a well-developed well-nourished alert cooperative   female who appears her stated age in no acute distress.  HEENT: normocephalic atraumatic , Eyes: PERRL EOM's full, conjunctiva clear, Nares: paten,t no deformity discharge or tenderness., Ears: no deformity EAC's clear TMs with normal landmarks. Mouth: clear OP, no lesions, edema.  Moist mucous membranes. Dentition in adequate repair. NECK: supple without masses, thyromegaly or bruits. CHEST/PULM:  Clear to auscultation and percussion breath sounds equal no wheeze , rales or rhonchi. No chest wall deformities or tenderness. Breast: normal by inspection . No dimpling, discharge, masses, tenderness or discharge . CV: PMI is nondisplaced, S1 S2 no gallops, murmurs, rubs. ? Mid click  Nl pulses Peripheral pulses are full without delay.No JVD.  ABDOMEN:  Bowel sounds normal nontender  No guard or rebound, no hepato splenomegal no CVA tenderness.  No hernia. Extremtities:  No clubbing cyanosis or edema, no acute joint swelling or redness no focal atrophy NEURO:  Oriented x3, cranial nerves 3-12 appear to be intact, no obvious focal weakness,gait within normal limits no abnormal reflexes or asymmetrical SKIN: No acute rashes normal turgor, color, no bruising or petechiae. PSYCH: Oriented, good eye contact, no obvious depression anxiety, cognition and judgment appear normal. LN: no cervical axillary inguinal adenopathy  Lab Results  Component Value Date   WBC 5.6 02/23/2013   HGB 15.2* 02/23/2013   HCT 44.7 02/23/2013   PLT 205.0 02/23/2013   GLUCOSE 96 02/23/2013   CHOL 242* 02/23/2013   TRIG 52.0 02/23/2013   HDL 99.30 02/23/2013   LDLDIRECT 128.8 02/23/2013   ALT 19 02/23/2013   AST 25 02/23/2013   NA 141 02/23/2013   K 5.3* 02/23/2013   CL 104 02/23/2013   CREATININE 0.4 02/23/2013   BUN 13 02/23/2013   CO2 29 02/23/2013   TSH 1.33 02/23/2013   HGBA1C 5.3 02/26/2012   MICROALBUR 2.0* 01/24/2009   i thing the k of 5.2 is insig  No renal disease or pot elevating meds or supplements  ASSESSMENT AND PLAN:  Discussed the following assessment and plan:  Visit for preventive health examination  Need for prophylactic vaccination and inoculation against influenza - Plan: Flu Vaccine QUAD 36+ mos PF IM (Fluarix)  Need for prophylactic vaccination and inoculation against other viral diseases(V04.89) - Plan: Varicella-zoster vaccine subcutaneous Counseled regarding healthy nutrition, exercise, sleep, injury prevention, calcium vit d and healthy weight . Continue  bp up today prob  from visit has been normal all other times .  Patient Care Team: Madelin Headings, MD as PCP - General Lenoard Aden, MD (Obstetrics and Gynecology) Pryor Ochoa, MD (Thoracic Diseases) Ernestina Penna, MD as Consulting Physician (Ophthalmology) Patient Instructions   Get dexa scan  (Bone density)   Next year 2015 .  Call ahead  For order.  Continue lifestyle intervention healthy eating and exercise . 150 minutes of exercise weeks  , bmI below 30 ;25 optimum . Avoid trans fats and processed foods;  Increase fresh fruits and veges to 5 servings per day. And avoid sweet beverages  Including tea and juice.  Optimize calcium vit  D 800- 1000 iu per day in diet  or foods.   Bone Health Our bones do many things. They provide structure, protect organs, anchor muscles, and store calcium. Adequate calcium in your diet and weight-bearing physical activity help build strong bones, improve bone amounts, and may reduce the risk of weakening of bones (osteoporosis) later in life. PEAK BONE MASS By age 40, the average woman has acquired most of her skeletal bone mass. A large decline occurs in older adults which increases the risk of osteoporosis. In women this occurs around the time of menopause. It is important for young girls to reach their peak bone mass in order to maintain bone health throughout life. A person with high bone mass as a young adult will be more likely to have a higher bone mass later in life. Not enough calcium consumption and physical activity early on could result in a failure to achieve optimum bone mass in adulthood. OSTEOPOROSIS Osteoporosis is a disease of the bones. It is defined as low bone mass with deterioration of bone structure. Osteoporosis leads to an increase risk of fractures with falls. These fractures commonly happen in the wrist, hip, and spine. While men and women of all ages and background can develop osteoporosis, some of the risk factors for osteoporosis are:  Female.  White.  Postmenopausal.  Older adults.  Small in body size.  Eating a diet low in calcium.  Physically inactive.  Smoking.  Use of some medications such as prednisone.  Family history. CALCIUM Calcium is a mineral needed by the body for healthy  bones, teeth, and proper function of the heart, muscles, and nerves. The body cannot produce calcium so it must be absorbed through food. Good sources of calcium include:  Dairy products (low fat or nonfat milk, cheese, and yogurt).  Dark green leafy vegetables (bok choy and broccoli).  Calcium fortified foods (orange juice, cereal, bread, soy beverages, and tofu products).  Nuts (almonds). Recommended amounts of calcium vary for individuals. Below is a guide for how much calcium you should take in daily, as outlined by the [employer]. RECOMMENDED CALCIUM INTAKES Age and Amount in mg per day  Birth - 6 months...........210 mg  6 months - 1 year.........270 mg  1 - 3 years....................500 mg  4 - 8 years....................800 mg  9 - 13 years................1300 mg  14 - 18 years..............1300 mg  19 - 30 years..............1000 mg  31 - 50 years..............1000 mg  51 - 70 years..............1200 mg  70 or older.................1200 mg  Pregnant & lactating...1000 mg  14 - 18 years..............1300 mg  19 - 50 years..............1000 mg Vitamin D also plays an important role in healthy bone development. Vitamin D helps in the absorption of calcium (this is why milk is fortified with vitamin D). For more information on calcium and children visit the [employer] of Child Health and Merchandiser, retail ([employer]IC[employer]) Website at [email or web address]. WEIGHT-BEARING PHYSICAL ACTIVITY Regular physical activity has many positive health benefits. Benefits include strong bones. Weight-bearing physical activity early in life is important in reaching peak bone mass. Weight-bearing physical activities cause muscles and bones to work against gravity. Some examples of weight bearing physical activities include:  Walking, jogging, or running.  [employer].  Jumping rope.  Dancing.  Soccer.  Tennis or Racquetball.  Stair  climbing.  Basketball.  Hiking.  Weight lifting.  Aerobic fitness classes. Including weight-bearing physical activity into an exercise plan is a great way to keep bones healthy and meet physical activity recommendations set forth in the Dietary Guidelines for Americans.  Adults: Engage in at least 30 minutes of moderate physical activity on most, preferably all, days of the week. Children: Engage in at least 60 minutes of moderate physical activity on most, preferably all, days of the week. FOR MORE INFORMATION [place] [employer], Oceanographer for [employer] and Promotion: www.cnpp.usda.gov National Osteoporosis Foundation: [email or web address] Document Released: 07/27/2003 Document Revised: 07/29/2011 Document Reviewed: 10/26/2008 [facility] Patient Information 2014 [place], [place].         Neta Mends. Abdullah Rizzi Rideout M.D.   Health Maintenance  Topic Date Due  . Colonoscopy  09/25/2002  . Influenza Vaccine  12/18/2013  . Pap Smear  10/21/2014  . Mammogram  01/09/2015  . Tetanus/tdap  11/02/2015  . Zostavax  Completed   Health Maintenance Review }

## 2014-01-10 LAB — HM MAMMOGRAPHY

## 2014-01-11 ENCOUNTER — Encounter: Payer: Self-pay | Admitting: Internal Medicine

## 2014-03-01 ENCOUNTER — Other Ambulatory Visit (INDEPENDENT_AMBULATORY_CARE_PROVIDER_SITE_OTHER): Payer: 59

## 2014-03-01 DIAGNOSIS — Z0000 Encounter for general adult medical examination without abnormal findings: Secondary | ICD-10-CM

## 2014-03-01 LAB — BASIC METABOLIC PANEL
BUN: 13 mg/dL (ref 6–23)
CO2: 30 meq/L (ref 19–32)
CREATININE: 0.5 mg/dL (ref 0.4–1.2)
Calcium: 9.2 mg/dL (ref 8.4–10.5)
Chloride: 104 mEq/L (ref 96–112)
GFR: 130.12 mL/min (ref >60.00)
GLUCOSE: 97 mg/dL (ref 70–99)
Potassium: 4.1 mEq/L (ref 3.5–5.1)
Sodium: 138 mEq/L (ref 135–145)

## 2014-03-01 LAB — LIPID PANEL
CHOL/HDL RATIO: 2
Cholesterol: 224 mg/dL — ABNORMAL HIGH (ref 0–200)
HDL: 91.7 mg/dL (ref >39.00)
LDL Cholesterol: 125 mg/dL — ABNORMAL HIGH (ref 0–99)
NonHDL: 132.3
Triglycerides: 39 mg/dL (ref 0.0–149.0)
VLDL: 7.8 mg/dL (ref 0.0–40.0)

## 2014-03-01 LAB — CBC WITH DIFFERENTIAL/PLATELET
BASOS PCT: 0.6 % (ref 0.0–3.0)
Basophils Absolute: 0 10*3/uL (ref 0.0–0.1)
Eosinophils Absolute: 0.3 10*3/uL (ref 0.0–0.7)
Eosinophils Relative: 6.6 % — ABNORMAL HIGH (ref 0.0–5.0)
HCT: 43 % (ref 36.0–46.0)
HEMOGLOBIN: 14.2 g/dL (ref 12.0–15.0)
Lymphocytes Relative: 28.4 % (ref 12.0–46.0)
Lymphs Abs: 1.4 10*3/uL (ref 0.7–4.0)
MCHC: 32.9 g/dL (ref 30.0–36.0)
MCV: 91.9 fl (ref 78.0–100.0)
MONO ABS: 0.5 10*3/uL (ref 0.1–1.0)
Monocytes Relative: 10 % (ref 3.0–12.0)
Neutro Abs: 2.6 10*3/uL (ref 1.4–7.7)
Neutrophils Relative %: 54.4 % (ref 43.0–77.0)
Platelets: 210 10*3/uL (ref 150.0–400.0)
RBC: 4.68 Mil/uL (ref 3.87–5.11)
RDW: 13.4 % (ref 11.5–15.5)
WBC: 4.8 10*3/uL (ref 4.0–10.5)

## 2014-03-01 LAB — TSH: TSH: 1.63 u[IU]/mL (ref 0.35–4.50)

## 2014-03-01 LAB — HEPATIC FUNCTION PANEL
ALT: 23 U/L (ref 0–35)
AST: 25 U/L (ref 0–37)
Albumin: 3.5 g/dL (ref 3.5–5.2)
Alkaline Phosphatase: 45 U/L (ref 39–117)
Bilirubin, Direct: 0 mg/dL (ref 0.0–0.3)
Total Bilirubin: 0.6 mg/dL (ref 0.2–1.2)
Total Protein: 7 g/dL (ref 6.0–8.3)

## 2014-03-08 ENCOUNTER — Encounter: Payer: Self-pay | Admitting: Internal Medicine

## 2014-03-08 ENCOUNTER — Ambulatory Visit (INDEPENDENT_AMBULATORY_CARE_PROVIDER_SITE_OTHER): Payer: 59 | Admitting: Internal Medicine

## 2014-03-08 VITALS — BP 117/70 | Temp 98.3°F | Ht 68.0 in | Wt 128.5 lb

## 2014-03-08 DIAGNOSIS — Z0000 Encounter for general adult medical examination without abnormal findings: Secondary | ICD-10-CM

## 2014-03-08 DIAGNOSIS — Z23 Encounter for immunization: Secondary | ICD-10-CM

## 2014-03-08 NOTE — Patient Instructions (Signed)
Continue   Healthy life style  Healthy lifestyle includes : At least 150 minutes of exercise weeks  , weight at healthy levels, which is usually   BMI 19-25. Avoid trans fats and processed foods;  Increase fresh fruits and veges to 5 servings per day. And avoid sweet beverages including tea and juice. Mediterranean diet with olive oil and nuts have been noted to be heart and brain healthy . Avoid tobacco products . Limit  alcohol to  7 per week for women and 14 servings for men.  Get adequate sleep . Wear seat belts . Don't text and drive .

## 2014-03-08 NOTE — Progress Notes (Signed)
Pre visit review using our clinic review tool, if applicable. No additional management support is needed unless otherwise documented below in the visit note.  Chief Complaint  Patient presents with  . Annual Exam    HPI: Patient comes in today for Preventive Health Care visit  Since last visit  Had derm check atypical   Moles not cancer   Donates blood  117/73  And 80 pulse bp goes up just here  Health Maintenance  Topic Date Due  . Colonoscopy  09/25/2002  . Pap Smear  10/21/2014  . Influenza Vaccine  12/19/2014  . Tetanus/tdap  11/02/2015  . Mammogram  01/11/2016  . Zostavax  Completed   Health Maintenance Review LIFESTYLE:  Exercise:  Walk 8 miles per day .  Tobacco/ETS: no  Alcohol: 2 3 days per week  Sugar beverages: no Sleep: about 8 hours  Drug use: no Bone density:  Colonoscopy: ? Due in 2016  PAP   June  mammo good .  ROS:  Back much better recnetly uses compression stockings GEN/ HEENT: No fever, significant weight changes sweats headaches vision problems hearing changes, CV/ PULM; No chest pain shortness of breath cough, syncope,edema  change in exercise tolerance. GI /GU: No adominal pain, vomiting, change in bowel habits. No blood in the stool. No significant GU symptoms. SKIN/HEME: ,no acute skin rashes suspicious lesions or bleeding. No lymphadenopathy, nodules, masses.  NEURO/ PSYCH:  No neurologic signs such as weakness numbness. No depression anxiety. IMM/ Allergy: No unusual infections.  Allergy .   REST of 12 system review negative except as per HPI   Past Medical History  Diagnosis Date  . DVT (deep vein thrombosis) in pregnancy   . Varicose veins     strip and laser  . History of renal stone     2011  . Normal echocardiogram 2011    mild lvh ? aortic sclerosis no as      Family History  Problem Relation Age of Onset  . Hypertension    . Kidney disease      renal stones  . Arthritis    . Cancer Father     bladder   250s   . Ovarian  cancer Mother     470s  . Other      son age 61 irreg HB  had cardioversion   . Thyroid disease      History   Social History  . Marital Status: Married    Spouse Name: N/A    Number of Children: N/A  . Years of Education: N/A   Social History Main Topics  . Smoking status: Never Smoker   . Smokeless tobacco: None  . Alcohol Use: Yes  . Drug Use: No  . Sexual Activity: None   Other Topics Concern  . None   Social History Narrative   HH of 2    Grandchild  Sons    reitred [employer] Child Support   Married.    No  ets tobacco No  Exercise restriction   etoh 2 x per week.    Exercise good.       G3P2    Outpatient Encounter Prescriptions as of 03/08/2014  Medication Sig  . aspirin 81 MG tablet Take 81 mg by mouth daily.    . calcium-vitamin D (OSCAL WITH D) 500-200 MG-UNIT per tablet Take 1 tablet by mouth daily.    . cholecalciferol (VITAMIN D) 1000 UNITS tablet Take 1,000 Units by mouth daily.    .Marland Kitchen  Multiple Vitamins-Minerals (ONE DAILY WOMENS 50+ PO) Take 1 tablet by mouth daily.  . Probiotic Product (PROBIOTIC DAILY PO) Take by mouth.  . vitamin E (VITAMIN E) 400 UNIT capsule Take 400 Units by mouth daily.      EXAM:  BP 117/70  Temp(Src) 98.3 F (36.8 C) (Oral)  Ht 5\' 8"  (1.727 m)  Wt 128 lb 8 oz (58.287 kg)  BMI 19.54 kg/m2  Body mass index is 19.54 kg/(m^2).  Physical Exam: Vital signs reviewed ZOX:[id number] is a well-developed well-nourished alert cooperative    who appearsr stated age in no acute distress.  HEENT: normocephalic atraumatic , Eyes: PERRL EOM's full, conjunctiva clear, Nares: paten,t no deformity discharge or tenderness., Ears: no deformity EAC's clear TMs with normal landmarks. Mouth: clear OP, no lesions, edema.  Moist mucous membranes. Dentition in adequate repair. NECK: supple without masses, thyromegaly or bruits. CHEST/PULM:  Clear to auscultation and percussion breath sounds equal no wheeze , rales or rhonchi. No chest wall deformities or  tenderness. CV: PMI is nondisplaced, S1 S2 no gallops, murmurs, rubs. Peripheral pulses are full without delay.No JVD .  Breast: normal by inspection . No dimpling, discharge, masses, tenderness or discharge . ABDOMEN: Bowel sounds normal nontender  No guard or rebound, no hepato splenomegal no CVA tenderness.  No hernia. Extremtities:  No clubbing cyanosis or edema, no acute joint swelling or redness no focal atrophy  Flat varicosities   NEURO:  Oriented x3, cranial nerves 3-12 appear to be intact, no obvious focal weakness,gait within normal limits no abnormal reflexes or asymmetrical SKIN: No acute rashes normal turgor, color, no bruising or petechiae.healing bx site legs  PSYCH: Oriented, good eye contact, no obvious depression anxiety, cognition and judgment appear normal. LN: no cervical axillary inguinal adenopathy  Lab Results  Component Value Date   WBC 4.8 03/01/2014   HGB 14.2 03/01/2014   HCT 43.0 03/01/2014   PLT 210.0 03/01/2014   GLUCOSE 97 03/01/2014   CHOL 224* 03/01/2014   TRIG 39.0 03/01/2014   HDL 91.70 03/01/2014   LDLDIRECT 128.8 02/23/2013   LDLCALC 125* 03/01/2014   ALT 23 03/01/2014   AST 25 03/01/2014   NA 138 03/01/2014   K 4.1 03/01/2014   CL 104 03/01/2014   CREATININE 0.5 03/01/2014   BUN 13 03/01/2014   CO2 30 03/01/2014   TSH 1.63 03/01/2014   HGBA1C 5.3 02/26/2012   MICROALBUR 2.0* 01/24/2009    ASSESSMENT AND PLAN:  Discussed the following assessment and plan:  Visit for preventive health examination - utd continue healthy living couseling yearly preventive visit colon due 2016  Need for prophylactic vaccination and inoculation against influenza - Plan: Flu Vaccine QUAD 36+ mos PF IM (Fluarix Quad PF)  Patient Care Team: Madelin Headings, MD as PCP - General Lenoard Aden, MD (Obstetrics and Gynecology) Pryor Ochoa, MD (Thoracic Diseases) Ernestina Penna, MD as Consulting Physician (Ophthalmology) Elesa Hacker, MD as  Referring Physician (Dermatology) Patient Instructions  Continue   Healthy life style  Healthy lifestyle includes : At least 150 minutes of exercise weeks  , weight at healthy levels, which is usually   BMI 19-25. Avoid trans fats and processed foods;  Increase fresh fruits and veges to 5 servings per day. And avoid sweet beverages including tea and juice. Mediterranean diet with olive oil and nuts have been noted to be heart and brain healthy . Avoid tobacco products . Limit  alcohol to  7 per week for women  and 14 servings for men.  Get adequate sleep . Wear seat belts . Don't text and drive .     Neta MendsWanda K. Panosh M.D.

## 2014-04-01 ENCOUNTER — Encounter: Payer: Self-pay | Admitting: Internal Medicine

## 2014-04-01 ENCOUNTER — Ambulatory Visit (INDEPENDENT_AMBULATORY_CARE_PROVIDER_SITE_OTHER): Payer: 59 | Admitting: Internal Medicine

## 2014-04-01 VITALS — BP 148/86 | Temp 98.9°F | Ht 68.0 in | Wt 126.0 lb

## 2014-04-01 DIAGNOSIS — J3489 Other specified disorders of nose and nasal sinuses: Secondary | ICD-10-CM

## 2014-04-01 DIAGNOSIS — J069 Acute upper respiratory infection, unspecified: Secondary | ICD-10-CM

## 2014-04-01 DIAGNOSIS — R030 Elevated blood-pressure reading, without diagnosis of hypertension: Secondary | ICD-10-CM

## 2014-04-01 MED ORDER — AMOXICILLIN 875 MG PO TABS: 875.0000 mg | ORAL_TABLET | Freq: Three times a day (TID) | ORAL | Status: DC

## 2014-04-01 MED ORDER — HYDROCODONE-HOMATROPINE 5-1.5 MG/5ML PO SYRP: 5.0000 mL | ORAL_SOLUTION | ORAL | Status: DC | PRN

## 2014-04-01 NOTE — Patient Instructions (Addendum)
This could be  A secondary sinus infection from the viral respiratory infection. Empiric therapy with high-dose amoxicillin for 1 week.  Blood pressure is elevated for you after the wedding and the illness check some readings  Take blood pressure readings twice a day for 7- 10 days and then periodically .goal is  below 140/90   .Send in readings    Via My chart if possible  Plan fu after that if needed     Sinusitis Sinusitis is redness, soreness, and inflammation of the paranasal sinuses. Paranasal sinuses are air pockets within the bones of your face (beneath the eyes, the middle of the forehead, or above the eyes). In healthy paranasal sinuses, mucus is able to drain out, and air is able to circulate through them by way of your nose. However, when your paranasal sinuses are inflamed, mucus and air can become trapped. This can allow bacteria and other germs to grow and cause infection. Sinusitis can develop quickly and last only a short time (acute) or continue over a long period (chronic). Sinusitis that lasts for more than 12 weeks is considered chronic.  CAUSES  Causes of sinusitis include:  Allergies.  Structural abnormalities, such as displacement of the cartilage that separates your nostrils (deviated septum), which can decrease the air flow through your nose and sinuses and affect sinus drainage.  Functional abnormalities, such as when the small hairs (cilia) that line your sinuses and help remove mucus do not work properly or are not present. SIGNS AND SYMPTOMS  Symptoms of acute and chronic sinusitis are the same. The primary symptoms are pain and pressure around the affected sinuses. Other symptoms include:  Upper toothache.  Earache.  Headache.  Bad breath.  Decreased sense of smell and taste.  A cough, which worsens when you are lying flat.  Fatigue.  Fever.  Thick drainage from your nose, which often is green and may contain pus (purulent).  Swelling and  warmth over the affected sinuses. DIAGNOSIS  Your health care provider will perform a physical exam. During the exam, your health care provider may:  Look in your nose for signs of abnormal growths in your nostrils (nasal polyps).  Tap over the affected sinus to check for signs of infection.  View the inside of your sinuses (endoscopy) using an imaging device that has a light attached (endoscope). If your health care provider suspects that you have chronic sinusitis, one or more of the following tests may be recommended:  Allergy tests.  Nasal culture. A sample of mucus is taken from your nose, sent to a lab, and screened for bacteria.  Nasal cytology. A sample of mucus is taken from your nose and examined by your health care provider to determine if your sinusitis is related to an allergy. TREATMENT  Most cases of acute sinusitis are related to a viral infection and will resolve on their own within 10 days. Sometimes medicines are prescribed to help relieve symptoms (pain medicine, decongestants, nasal steroid sprays, or saline sprays).  However, for sinusitis related to a bacterial infection, your health care provider will prescribe antibiotic medicines. These are medicines that will help kill the bacteria causing the infection.  Rarely, sinusitis is caused by a fungal infection. In theses cases, your health care provider will prescribe antifungal medicine. For some cases of chronic sinusitis, surgery is needed. Generally, these are cases in which sinusitis recurs more than 3 times per year, despite other treatments. HOME CARE INSTRUCTIONS   Drink plenty of water. Water  helps thin the mucus so your sinuses can drain more easily.  Use a humidifier.  Inhale steam 3 to 4 times a day (for example, sit in the bathroom with the shower running).  Apply a warm, moist washcloth to your face 3 to 4 times a day, or as directed by your health care provider.  Use saline nasal sprays to help  moisten and clean your sinuses.  Take medicines only as directed by your health care provider.  If you were prescribed either an antibiotic or antifungal medicine, finish it all even if you start to feel better. SEEK IMMEDIATE MEDICAL CARE IF:  You have increasing pain or severe headaches.  You have nausea, vomiting, or drowsiness.  You have swelling around your face.  You have vision problems.  You have a stiff neck.  You have difficulty breathing. MAKE SURE YOU:   Understand these instructions.  Will watch your condition.  Will get help right away if you are not doing well or get worse. Document Released: 05/06/2005 Document Revised: 09/20/2013 Document Reviewed: 05/21/2011 [facility] Patient Information 2015 [place]Care, [place]. This information is not intended to replace advice given to you by your health care provider. Make sure you discuss any questions you have with your health care provider.

## 2014-04-01 NOTE — Progress Notes (Signed)
Pre visit review using our clinic review tool, if applicable. No additional management support is needed unless otherwise documented below in the visit note.   Chief Complaint  Patient presents with  . Cough    X2WKS  . Sore Throat  . Generalized Body Aches  . Night Sweats  . Post Nasal Drip    HPI: Patient Linda Richard  comes in today for SDA for  new problem evaluation. Onset about 2 weeks ago  Scratchy throat  And tickle  cough at night and achiness and  Though was getting better but now neck hurts    And burns in throat even with  with yogurt. Frontal ha around eyes sweat lat pm. Cough drainage but no sob . bp noted to be up for her deslym  Otc. 63 yo left over cough med  Son getting married tomorrow in town   bp was up at home for her  In 140 range ROS: See pertinent positives and negatives per HPI. No cp sob hemoptysis   Past Medical History  Diagnosis Date  . DVT (deep vein thrombosis) in pregnancy   . Varicose veins     strip and laser  . History of renal stone     2011  . Normal echocardiogram 2011    mild lvh ? aortic sclerosis no as      Family History  Problem Relation Age of Onset  . Hypertension    . Kidney disease      renal stones  . Arthritis    . Cancer Father     bladder   80s   . Ovarian cancer Mother     57s  . Other      son age 33 irreg HB  had cardioversion   . Thyroid disease      History   Social History  . Marital Status: Married    Spouse Name: N/A    Number of Children: N/A  . Years of Education: N/A   Social History Main Topics  . Smoking status: Never Smoker   . Smokeless tobacco: Not on file  . Alcohol Use: Yes  . Drug Use: No  . Sexual Activity: Not on file   Other Topics Concern  . Not on file   Social History Narrative   HH of 2    Grandchild  Sons    reitred [employer] Child Support   Married.    No  ets tobacco No  Exercise restriction   etoh 2 x per week.    Exercise good.       G3P2    Outpatient  Encounter Prescriptions as of 04/01/2014  Medication Sig  . aspirin 81 MG tablet Take 81 mg by mouth daily.    . calcium-vitamin D (OSCAL WITH D) 500-200 MG-UNIT per tablet Take 1 tablet by mouth daily.    . cholecalciferol (VITAMIN D) 1000 UNITS tablet Take 1,000 Units by mouth daily.    . Multiple Vitamins-Minerals (ONE DAILY WOMENS 50+ PO) Take 1 tablet by mouth daily.  . Probiotic Product (PROBIOTIC DAILY PO) Take by mouth.  Marland Kitchen amoxicillin (AMOXIL) 875 MG tablet Take 1 tablet (875 mg total) by mouth 3 (three) times daily.  Marland Kitchen HYDROcodone-homatropine (HYCODAN) 5-1.5 MG/5ML syrup Take 5 mLs by mouth every 4 (four) hours as needed for cough. 1-2 tsp  . [DISCONTINUED] vitamin E (VITAMIN E) 400 UNIT capsule Take 400 Units by mouth daily.      EXAM:  BP 148/86 mmHg  Temp(Src) 98.9 F (37.2 C) (Oral)  Ht 5\' 8"  (1.727 m)  Wt 126 lb (57.153 kg)  BMI 19.16 kg/m2  Body mass index is 19.16 kg/(m^2).  GENERAL: vitals reviewed and listed above, alert, oriented, appears well hydrated and in no acute distress congested midly  HEENT: atraumatic, conjunctiva  clear, no obvious abnormalities on inspection of external nose and earstm intact  OP : no lesion edema or exudate rred cobblestoning drainage tracts notedno maxillary tenderness points to frontal retro-orbital area NECK: no obvious masses on inspection palpation no significant adenopathy child shoddy nodes LUNGS: clear to auscultation bilaterally, no wheezes, rales or rhonchi, good air movement CV: HRRR, no clubbing cyanosis or  peripheral edema nl cap refill repeat blood pressure confirmed in the 140 ranges Abdomen soft without organomegaly guarding or rebound MS: moves all extremities without noticeable focal  abnormality PSYCH: pleasant and cooperative, no obvious depression or anxiety  ASSESSMENT AND PLAN:  Discussed the following assessment and plan:  Protracted upper respiratory infection  Sinus drainage - poss sinusitis with  relapsing sx  empiric rx high dose amox with gi precautions   and fu expectant management  Elevated blood pressure reading - Kibler readings at home Sinden results my chart and make plan for follow-up could be from illness medicines etc. Cough  Med.  -Patient advised to return or notify health care team  if symptoms worsen ,persist or new concerns arise.  Patient Instructions  This could be  A secondary sinus infection from the viral respiratory infection. Empiric therapy with high-dose amoxicillin for 1 week.  Blood pressure is elevated for you after the wedding and the illness check some readings  Take blood pressure readings twice a day for 7- 10 days and then periodically .goal is  below 140/90   .Send in readings    Via My chart if possible  Plan fu after that if needed     Sinusitis Sinusitis is redness, soreness, and inflammation of the paranasal sinuses. Paranasal sinuses are air pockets within the bones of your face (beneath the eyes, the middle of the forehead, or above the eyes). In healthy paranasal sinuses, mucus is able to drain out, and air is able to circulate through them by way of your nose. However, when your paranasal sinuses are inflamed, mucus and air can become trapped. This can allow bacteria and other germs to grow and cause infection. Sinusitis can develop quickly and last only a short time (acute) or continue over a long period (chronic). Sinusitis that lasts for more than 12 weeks is considered chronic.  CAUSES  Causes of sinusitis include:  Allergies.  Structural abnormalities, such as displacement of the cartilage that separates your nostrils (deviated septum), which can decrease the air flow through your nose and sinuses and affect sinus drainage.  Functional abnormalities, such as when the small hairs (cilia) that line your sinuses and help remove mucus do not work properly or are not present. SIGNS AND SYMPTOMS  Symptoms of acute and chronic sinusitis are  the same. The primary symptoms are pain and pressure around the affected sinuses. Other symptoms include:  Upper toothache.  Earache.  Headache.  Bad breath.  Decreased sense of smell and taste.  A cough, which worsens when you are lying flat.  Fatigue.  Fever.  Thick drainage from your nose, which often is green and may contain pus (purulent).  Swelling and warmth over the affected sinuses. DIAGNOSIS  Your health care provider will perform a physical exam. During the  exam, your health care provider may:  Look in your nose for signs of abnormal growths in your nostrils (nasal polyps).  Tap over the affected sinus to check for signs of infection.  View the inside of your sinuses (endoscopy) using an imaging device that has a light attached (endoscope). If your health care provider suspects that you have chronic sinusitis, one or more of the following tests may be recommended:  Allergy tests.  Nasal culture. A sample of mucus is taken from your nose, sent to a lab, and screened for bacteria.  Nasal cytology. A sample of mucus is taken from your nose and examined by your health care provider to determine if your sinusitis is related to an allergy. TREATMENT  Most cases of acute sinusitis are related to a viral infection and will resolve on their own within 10 days. Sometimes medicines are prescribed to help relieve symptoms (pain medicine, decongestants, nasal steroid sprays, or saline sprays).  However, for sinusitis related to a bacterial infection, your health care provider will prescribe antibiotic medicines. These are medicines that will help kill the bacteria causing the infection.  Rarely, sinusitis is caused by a fungal infection. In theses cases, your health care provider will prescribe antifungal medicine. For some cases of chronic sinusitis, surgery is needed. Generally, these are cases in which sinusitis recurs more than 3 times per year, despite other  treatments. HOME CARE INSTRUCTIONS   Drink plenty of water. Water helps thin the mucus so your sinuses can drain more easily.  Use a humidifier.  Inhale steam 3 to 4 times a day (for example, sit in the bathroom with the shower running).  Apply a warm, moist washcloth to your face 3 to 4 times a day, or as directed by your health care provider.  Use saline nasal sprays to help moisten and clean your sinuses.  Take medicines only as directed by your health care provider.  If you were prescribed either an antibiotic or antifungal medicine, finish it all even if you start to feel better. SEEK IMMEDIATE MEDICAL CARE IF:  You have increasing pain or severe headaches.  You have nausea, vomiting, or drowsiness.  You have swelling around your face.  You have vision problems.  You have a stiff neck.  You have difficulty breathing. MAKE SURE YOU:   Understand these instructions.  Will watch your condition.  Will get help right away if you are not doing well or get worse. Document Released: 05/06/2005 Document Revised: 09/20/2013 Document Reviewed: 05/21/2011 [facility] Patient Information 2015 [place]onExitCare, [place]. This information is not intended to replace advice given to you by your health care provider. Make sure you discuss any questions you have with your health care provider.       Neta MendsWanda K. Panosh M.D.

## 2014-04-25 ENCOUNTER — Other Ambulatory Visit: Payer: Self-pay | Admitting: *Deleted

## 2014-04-25 DIAGNOSIS — I83893 Varicose veins of bilateral lower extremities with other complications: Secondary | ICD-10-CM

## 2014-05-12 ENCOUNTER — Encounter: Payer: Self-pay | Admitting: Vascular Surgery

## 2014-05-16 ENCOUNTER — Ambulatory Visit (INDEPENDENT_AMBULATORY_CARE_PROVIDER_SITE_OTHER): Payer: 59 | Admitting: Vascular Surgery

## 2014-05-16 ENCOUNTER — Encounter: Payer: Self-pay | Admitting: Vascular Surgery

## 2014-05-16 ENCOUNTER — Ambulatory Visit (HOSPITAL_COMMUNITY)
Admission: RE | Admit: 2014-05-16 | Discharge: 2014-05-16 | Disposition: A | Discharge status: Home / Self Care | Payer: 59 | Source: Ambulatory Visit | Attending: Vascular Surgery | Admitting: Vascular Surgery

## 2014-05-16 VITALS — BP 161/80 | HR 78 | Resp 16 | Ht 69.0 in | Wt 128.0 lb

## 2014-05-16 DIAGNOSIS — I83893 Varicose veins of bilateral lower extremities with other complications: Secondary | ICD-10-CM

## 2014-05-16 DIAGNOSIS — I8393 Asymptomatic varicose veins of bilateral lower extremities: Secondary | ICD-10-CM | POA: Diagnosis present

## 2014-05-16 DIAGNOSIS — I82519 Chronic embolism and thrombosis of unspecified femoral vein: Secondary | ICD-10-CM | POA: Diagnosis not present

## 2014-05-16 DIAGNOSIS — I83899 Varicose veins of unspecified lower extremities with other complications: Secondary | ICD-10-CM | POA: Insufficient documentation

## 2014-05-16 NOTE — Progress Notes (Signed)
Subjective:     Patient ID: Linda Richard, female   DOB: 06/19/1950, 63 y.o.   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 Patient is well known to me having had venous procedures dating back to the 1980s including a distal stripping bilaterally from the ankle to the knee with excision of multiple varicosities. 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 She returned in 2009 with severe recurrent bulging varicosities and had gross reflux in both great saphenous veins. She had laser ablation bilaterally from the knee to the saphenofemoral junction and had an excellent result with resolution of her varicosities with stab phlebectomy. One year ago she began having again recurrent bulging varicosities more in the anterior thighs bilaterally extending into the calf area. She continues to have mild chronic edema in the left ankle and lesser degree in the right ankle. She has no history of stasis ulcers or bleeding. She has been wearing her long-leg elastic compression stockings 20-30 millimeter gradient and trying elevation ibuprofen and thus far has not had improvement in her symptoms.  Past Medical History  Diagnosis Date  . DVT (deep vein thrombosis) in pregnancy   . Varicose veins     strip and laser  . History of renal stone     2011  . Normal echocardiogram 2011    mild lvh ? aortic sclerosis no as      History  Substance Use Topics  . Smoking status: Never Smoker   . Smokeless tobacco: Not on file  . Alcohol Use: Yes    Family History  Problem Relation Age of Onset  . Hypertension    . Kidney disease      renal stones  . Arthritis    . Cancer Father     bladder   3750s   . Ovarian cancer Mother     7970s  . Other      son age 61 irreg HB  had cardioversion   . Thyroid disease      No Known Allergies  Current outpatient  prescriptions: aspirin 81 MG tablet, Take 81 mg by mouth daily.  , Disp: , Rfl: ;  calcium-vitamin D (OSCAL WITH D) 500-200 MG-UNIT per tablet, Take 1 tablet by mouth daily.  , Disp: , Rfl: ;  cholecalciferol (VITAMIN D) 1000 UNITS tablet, Take 1,000 Units by mouth daily.  , Disp: , Rfl: ;  Multiple Vitamins-Minerals (ONE DAILY WOMENS 50+ PO), Take 1 tablet by mouth daily., Disp: , Rfl:  Probiotic Product (PROBIOTIC DAILY PO), Take by mouth., Disp: , Rfl: ;  amoxicillin (AMOXIL) 875 MG tablet, Take 1 tablet (875 mg total) by mouth 3 (three) times daily. (Patient not taking: Reported on 05/16/2014), Disp: 21 tablet, Rfl: 0;  HYDROcodone-homatropine (HYCODAN) 5-1.5 MG/5ML syrup, Take 5 mLs by mouth every 4 (four) hours as needed for cough. 1-2 tsp (Patient not taking: Reported on 05/16/2014), Disp: 180 mL, Rfl: 0  BP 161/80 mmHg  Pulse 78  Resp 16  Ht 5\' 9"  (1.753 m)  Wt 128 lb (58.06 kg)  BMI 18.89 kg/m2  Body mass index is 18.89 kg/(m^2).           Review of Systems denies chest pain, dyspnea on exertion, claudication, hemoptysis   lateralizing weakness, aphasia, amaurosis fugax. All systems negative and a complete review of systems Objective:   Physical Exam  BP 161/80 mmHg  Pulse 78  Resp 16  Ht 5\' 9"  (1.753 m)  Wt 128 lb (58.06 kg)  BMI 18.89 kg/m2  Gen.-alert and oriented x3 in no apparent distress HEENT normal for age Lungs no rhonchi or wheezing Cardiovascular regular rhythm no murmurs carotid pulses 3+ palpable no bruits audible Abdomen soft nontender no palpable masses Musculoskeletal free of  major deformities Skin clear -no rashes Neurologic normal Lower extremities 3+ femoral and dorsalis pedis pulses palpable bilaterally with 1+ edema on the right and 1-2+ edema on the left. Right leg has bulging varicosities in the anterior thigh extending down to the knee level and then down into the medial calf area below the knee. No hyperpigmentation or ulceration is  noted. Left leg has bulging varicosities in the anterior thigh extending down medial to the knee and into the medial calf to the medial malleolus. No hyperpigmentation or ulceration is noted.  Today I ordered bilateral venous duplex exam which I reviewed and interpreted. There is no DVT on the right. There is some evidence of chronic DVT in the left distal superficial femoral vein which is an old finding. Both great saphenous veins are totally ablated and have no reflux. Both legs have patent anterior accessory branch of the great saphenous vein with gross reselect supplying these bulging painful varicosities.     Assessment:     Gross reflux bilateral anterior accessory branch great saphenous vein supplying bulging painful varicosities. Patient is status post bilateral great saphenous vein laser ablation in 2009 which has been successfully ablated she has tried long-leg elastic compression stockings elevation and ibuprofen but we have asked her to try this for 3 more months to see if she can relieve her symptoms    Plan:         #1 long leg elastic compression stockings 20-30 mm gradient #2 elevate legs as much as possible #3 ibuprofen daily on a regular basis for pain #4 return in 3 months-if no significant improvement then she will donate #1 laser ablation anterior accessory branch right great saphenous vein plus greater than 20 stab phlebectomy followed by #2 laser ablation left anterior accessory branch with greater than 20 stab phlebectomy of painful varicosities Return in 3 months

## 2014-06-14 ENCOUNTER — Encounter (HOSPITAL_COMMUNITY): Payer: 59

## 2014-06-14 ENCOUNTER — Encounter: Payer: 59 | Admitting: Vascular Surgery

## 2014-07-04 ENCOUNTER — Encounter: Payer: Self-pay | Admitting: Gastroenterology

## 2014-08-01 ENCOUNTER — Encounter: Payer: Self-pay | Admitting: Gastroenterology

## 2014-08-15 ENCOUNTER — Encounter: Payer: Self-pay | Admitting: Vascular Surgery

## 2014-08-16 ENCOUNTER — Ambulatory Visit (INDEPENDENT_AMBULATORY_CARE_PROVIDER_SITE_OTHER): Payer: 59 | Admitting: Vascular Surgery

## 2014-08-16 ENCOUNTER — Encounter: Payer: Self-pay | Admitting: Vascular Surgery

## 2014-08-16 VITALS — BP 142/59 | HR 69 | Resp 16 | Ht 69.0 in | Wt 128.0 lb

## 2014-08-16 DIAGNOSIS — I83893 Varicose veins of bilateral lower extremities with other complications: Secondary | ICD-10-CM | POA: Diagnosis not present

## 2014-08-16 DIAGNOSIS — I83899 Varicose veins of unspecified lower extremities with other complications: Secondary | ICD-10-CM | POA: Insufficient documentation

## 2014-08-16 NOTE — Progress Notes (Signed)
Subjective:     Patient ID: Linda Richard, female   DOB: 02/25/1951, 64 y.o.   MRN: [id number]  HPI this 64 year old female returns for continued follow-up regarding her painful varicosities in both lower extremities. She has a history of bilateral laser ablations many years ago and developed recurrent painful varicosities in both legs left worse than right. These are located in the medial thigh and medial calf area. She has tried long-leg elastic compression stockings 20-30 millimeter gradient as well as elevation and ibuprofen with no improvement. This is definitely affecting her daily living because of pain and swelling. She does have a remote history of DVT in the left leg which is chronic and the iliofemoral segment.  Past Medical History  Diagnosis Date  . DVT (deep vein thrombosis) in pregnancy   . Varicose veins     strip and laser  . History of renal stone     2011  . Normal echocardiogram 2011    mild lvh ? aortic sclerosis no as      History  Substance Use Topics  . Smoking status: Never Smoker   . Smokeless tobacco: Not on file  . Alcohol Use: Yes    Family History  Problem Relation Age of Onset  . Hypertension    . Kidney disease      renal stones  . Arthritis    . Cancer Father     bladder   6850s   . Ovarian cancer Mother     5970s  . Other      son age 62 irreg HB  had cardioversion   . Thyroid disease      No Known Allergies   Current outpatient prescriptions:  .  aspirin 81 MG tablet, Take 81 mg by mouth daily.  , Disp: , Rfl:  .  calcium-vitamin D (OSCAL WITH D) 500-200 MG-UNIT per tablet, Take 1 tablet by mouth daily.  , Disp: , Rfl:  .  cholecalciferol (VITAMIN D) 1000 UNITS tablet, Take 1,000 Units by mouth daily.  , Disp: , Rfl:  .  Multiple Vitamins-Minerals (ONE DAILY WOMENS 50+ PO), Take 1 tablet by mouth daily., Disp: , Rfl:  .  Probiotic Product (PROBIOTIC DAILY PO), Take by mouth., Disp: , Rfl:  .  amoxicillin (AMOXIL) 875 MG tablet, Take 1  tablet (875 mg total) by mouth 3 (three) times daily. (Patient not taking: Reported on 05/16/2014), Disp: 21 tablet, Rfl: 0 .  HYDROcodone-homatropine (HYCODAN) 5-1.5 MG/5ML syrup, Take 5 mLs by mouth every 4 (four) hours as needed for cough. 1-2 tsp (Patient not taking: Reported on 05/16/2014), Disp: 180 mL, Rfl: 0  Filed Vitals:   08/16/14 1052  BP: 142/59  Pulse: 69  Resp: 16  Height: 5\' 9"  (1.753 m)  Weight: 128 lb (58.06 kg)    Body mass index is 18.89 kg/(m^2).             Review of Systems denies chest pain, dyspnea on exertion, PND, orthopnea, claudication.     Objective:   Physical Exam BP 142/59 mmHg  Pulse 69  Resp 16  Ht 5\' 9"  (1.753 m)  Wt 128 lb (58.06 kg)  BMI 18.89 kg/m2  Gen. well-developed well-nourished female no apparent stress alert and oriented 3 Lungs no rhonchi or wheezing Both lower extremities with extensive bulging varicosities in the medial thigh and medial calf areas with 1+ distal edema. No active ulcer noted. 3+ dorsalis pedis pulse palpable.  Venous duplex exam at last visit revealed  some chronic DVT in the left distal superficial femoral vein. There is bilateral deep vein reflux. There is gross reflux at both saphenofemoral junctions but the majority of the great saphenous vein bilaterally has been treated successfully with laser ablation. There is an incompetent perforator in the left calf which connects with the painful varicosities.     Assessment:     Painful varicosities bilaterally in thigh and calf which are affecting patient's daily living. Symptoms are resistant to conservative measures including long-leg elastic compression stockings, elevation, and ibuprofen. Patient has had previous ablation of bilateral great saphenous veins in the past and currently has reflux through a short stump of both great saphenous systems supplying these painful varicosities as well as an incompetent perforating branch in the left calf.    Plan:      Patient symptoms are severe and she should have #1 greater than 20 stab phlebectomy of painful varicosities left leg followed by #2 greater than 20 stab phlebectomy of painful varicosities right leg. Will proceed with precertification to perform this in the near future to relieve this lady's symptoms which are affecting her daily living

## 2014-09-23 ENCOUNTER — Ambulatory Visit (AMBULATORY_SURGERY_CENTER): Payer: Self-pay | Admitting: *Deleted

## 2014-09-23 ENCOUNTER — Encounter: Payer: Self-pay | Admitting: Vascular Surgery

## 2014-09-23 ENCOUNTER — Encounter: Payer: Self-pay | Admitting: Gastroenterology

## 2014-09-23 VITALS — Ht 69.0 in | Wt 128.4 lb

## 2014-09-23 DIAGNOSIS — Z1211 Encounter for screening for malignant neoplasm of colon: Secondary | ICD-10-CM

## 2014-09-23 MED ORDER — NA SULFATE-K SULFATE-MG SULF 17.5-3.13-1.6 GM/177ML PO SOLN: 1.0000 | Freq: Once | ORAL | Status: DC

## 2014-09-23 NOTE — Progress Notes (Signed)
No egg or soy allergy No issues with past sedation No home 02 No diet pills No emmi video

## 2014-09-26 ENCOUNTER — Ambulatory Visit (INDEPENDENT_AMBULATORY_CARE_PROVIDER_SITE_OTHER): Payer: 59 | Admitting: Vascular Surgery

## 2014-09-26 VITALS — BP 140/70 | HR 70 | Resp 16 | Ht 69.0 in | Wt 128.0 lb

## 2014-09-26 DIAGNOSIS — I83893 Varicose veins of bilateral lower extremities with other complications: Secondary | ICD-10-CM

## 2014-09-26 NOTE — Progress Notes (Signed)
    Stab Phlebectomy Procedure  Linda Richard DOB:01/09/1951  09/26/2014  Consent signed: Yes  Surgeon:J.D. Hart RochesterLawson  Procedure: stab phlebectomy: left leg  BP 140/70 mmHg  Pulse 70  Resp 16  Ht 5\' 9"  (1.753 m)  Wt 128 lb (58.06 kg)  BMI 18.89 kg/m2  Start time: 9am   End time: 10am   Tumescent Anesthesia: 200 cc 0.9% NaCl with 50 cc Lidocaine HCL with 1% Epi and 15 cc 8.4% NaHCO3  Local Anesthesia: 8 cc Lidocaine HCL and NaHCO3 (ratio 2:1)    Stab Phlebectomy: >20 Sites: Calf and Ankle  Patient tolerated procedure well: Yes  Notes:   Description of Procedure:  After marking the course of the secondary varicosities, the patient was placed on the operating table in the supine position, and the left leg was prepped and draped in sterile fashion.    The patient was then put into Trendelenburg position.  Local anesthetic was administered at the previously marked varicosities, and tumescent anesthesia was administered around the vessels.  Greater than 20 stab wounds were made using the tip of an 11 blade. And using the vein hook, the phlebectomies were performed using a hemostat to avulse the varicosities.  Adequate hemostasis was achieved, and steri strips were applied to the stab wound.   utterfly needle.   ABD pads and thigh high compression stockings were applied as well ace wraps where needed. Blood loss was less than 15 cc.  The patient ambulated out of the operating room having tolerated the procedure well.

## 2014-09-26 NOTE — Progress Notes (Signed)
Subjective:     Patient ID: Linda Richard, female   DOB: 01/04/1951, 64 y.o.   MRN: [id number]  HPI this 64 year old female had multiple stab phlebectomy of painful varicosities-greater than 20 the left leg performed under local tumescent anesthesia. She tolerated the procedure well.   Review of Systems     Objective:   Physical Exam BP 140/70 mmHg  Pulse 70  Resp 16  Ht 5\' 9"  (1.753 m)  Wt 128 lb (58.06 kg)  BMI 18.89 kg/m2       Assessment:     Well-tolerated multiple stab phlebectomy painful varicosities left leg performed under local tumescent anesthesia    Plan:     Return in 2 weeks for similar procedure on contralateral right leg

## 2014-09-27 ENCOUNTER — Telehealth: Payer: Self-pay | Admitting: *Deleted

## 2014-09-27 NOTE — Telephone Encounter (Signed)
Pt states she had a good night except the ankle area is sore. She had no bleeding through the ace wraps. Told her it was ok to loosen the ace wraps today. Following all instructions.

## 2014-09-28 ENCOUNTER — Encounter: Payer: Self-pay | Admitting: Vascular Surgery

## 2014-09-30 ENCOUNTER — Other Ambulatory Visit: Payer: Self-pay | Admitting: *Deleted

## 2014-09-30 ENCOUNTER — Ambulatory Visit (HOSPITAL_COMMUNITY)
Admission: RE | Admit: 2014-09-30 | Discharge: 2014-09-30 | Disposition: A | Discharge status: Home / Self Care | Payer: 59 | Source: Ambulatory Visit | Attending: Gastroenterology | Admitting: Gastroenterology

## 2014-09-30 ENCOUNTER — Encounter: Payer: Self-pay | Admitting: Gastroenterology

## 2014-09-30 ENCOUNTER — Ambulatory Visit (AMBULATORY_SURGERY_CENTER): Payer: 59 | Admitting: Gastroenterology

## 2014-09-30 VITALS — BP 133/71 | HR 69 | Temp 97.1°F | Resp 16 | Ht 69.0 in | Wt 128.0 lb

## 2014-09-30 DIAGNOSIS — K5730 Diverticulosis of large intestine without perforation or abscess without bleeding: Secondary | ICD-10-CM

## 2014-09-30 DIAGNOSIS — K5790 Diverticulosis of intestine, part unspecified, without perforation or abscess without bleeding: Secondary | ICD-10-CM | POA: Insufficient documentation

## 2014-09-30 DIAGNOSIS — R933 Abnormal findings on diagnostic imaging of other parts of digestive tract: Secondary | ICD-10-CM

## 2014-09-30 DIAGNOSIS — Z1211 Encounter for screening for malignant neoplasm of colon: Secondary | ICD-10-CM

## 2014-09-30 MED ORDER — SODIUM CHLORIDE 0.9 % IV SOLN: 500.0000 mL | INTRAVENOUS | Status: DC

## 2014-09-30 NOTE — Progress Notes (Signed)
Report to PACU, RN, vss, BBS= Clear.  

## 2014-09-30 NOTE — Patient Instructions (Signed)
YOU HAD AN ENDOSCOPIC PROCEDURE TODAY AT THE [facility]:   Refer to the procedure report that was given to you for any specific questions about what was found during the examination.  If the procedure report does not answer your questions, please call your gastroenterologist to clarify.  If you requested that your care partner not be given the details of your procedure findings, then the procedure report has been included in a sealed envelope for you to review at your convenience later.  YOU SHOULD EXPECT: Some feelings of bloating in the abdomen. Passage of more gas than usual.  Walking can help get rid of the air that was put into your GI tract during the procedure and reduce the bloating. If you had a lower endoscopy (such as a colonoscopy or flexible sigmoidoscopy) you may notice spotting of blood in your stool or on the toilet paper. If you underwent a bowel prep for your procedure, you may not have a normal bowel movement for a few days.  Please Note:  You might notice some irritation and congestion in your nose or some drainage.  This is from the oxygen used during your procedure.  There is no need for concern and it should clear up in a day or so.  SYMPTOMS TO REPORT IMMEDIATELY:   Following lower endoscopy (colonoscopy or flexible sigmoidoscopy):  Excessive amounts of blood in the stool  Significant tenderness or worsening of abdominal pains  Swelling of the abdomen that is new, acute  Fever of 100F or higher  For urgent or emergent issues, a gastroenterologist can be reached at any hour by calling [phone].  DIET: Your first meal following the procedure should be a small meal and then it is ok to progress to your normal diet. Heavy or fried foods are harder to digest and may make you feel nauseous or bloated.  Likewise, meals heavy in dairy and vegetables can increase bloating.  Drink plenty of fluids but you should avoid alcoholic beverages for 24 hours.  ACTIVITY:   You should plan to take it easy for the rest of today and you should NOT DRIVE or use heavy machinery until tomorrow (because of the sedation medicines used during the test).    FOLLOW UP: Our staff will call the number listed on your records the next business day following your procedure to check on you and address any questions or concerns that you may have regarding the information given to you following your procedure. If we do not reach you, we will leave a message.  However, if you are feeling well and you are not experiencing any problems, there is no need to return our call.  We will assume that you have returned to your regular daily activities without incident.  SIGNATURES/CONFIDENTIALITY: You and/or your care partner have signed paperwork which will be entered into your electronic medical record.  These signatures attest to the fact that that the information above on your After Visit Summary has been reviewed and is understood.  Full responsibility of the confidentiality of this discharge information lies with you and/or your care-partner.  Please [facility] at [facility] at 1:45 today, Barium Enema is at 2:00  Continue your normal medications

## 2014-09-30 NOTE — Op Note (Signed)
[facility] [address] [place], [id number]   COLONOSCOPY PROCEDURE REPORT  PATIENT: Linda Richard, Linda Richard  MR#: [id number] BIRTHDATE: [birth year] , 64  yrs. old GENDER: female ENDOSCOPIST: Louis Meckelobert D Kaplan, MD REFERRED AO:ZHYQMBY:Wanda Lonie PeakK Panosh, Richard.D. PROCEDURE DATE:  09/30/2014 PROCEDURE:   Colonoscopy, screening (incomplete exam) First Screening Colonoscopy - Avg.  risk and is 50 yrs.  old or older - No.  Prior Negative Screening - Now for repeat screening. 10 or more years since last screening  History of Adenoma - Now for follow-up colonoscopy & has been > or = to 3 yrs.  N/A  Polyps removed today? No Recommend repeat exam, <10 yrs? No ASA CLASS:   Class II INDICATIONS:Colorectal Neoplasm Risk Assessment for this procedure is average risk. MEDICATIONS: Monitored anesthesia care and Propofol 300 mg IV  DESCRIPTION OF PROCEDURE:   After the risks benefits and alternatives of the procedure were thoroughly explained, informed consent was obtained.  The digital rectal exam revealed no abnormalities of the rectum.   The LB CF-H180AL Loaner [id number] endoscope was introduced through the anus and advanced to the sigmoid colon. No adverse events experienced.   The quality of the prep was (Suprep was used) excellent.  The instrument was then slowly withdrawn as the colon was fully examined.      COLON FINDINGS: There was severe diverticulosis noted in the sigmoid colon with associated colonic narrowing and angulation. Unable to pass the adult colonoscope due to severe angulation and luminal narrowing.  Multiple attempts were made with the pediatric colonoscope and were similarly unsuccessful Retroflexed views revealed no abnormalities. The time to cecum = 3.2 Withdrawal time = 9.0   The scope was withdrawn and the procedure completed. COMPLICATIONS: There were no immediate complications.  ENDOSCOPIC IMPRESSION: 1.  diverticulosis 2.  Incomplete exam due to severe angulation  and diverticulosis   RECOMMENDATIONS: Barium enema  eSigned:  Louis Meckelobert D Kaplan, MD 09/30/2014 8:46 AM   cc:

## 2014-10-03 ENCOUNTER — Telehealth: Payer: Self-pay | Admitting: *Deleted

## 2014-10-03 NOTE — Telephone Encounter (Signed)
  Follow up Call-  Call back number 09/30/2014  Post procedure Call Back phone  # [phone]  Permission to leave phone message Yes     Patient questions:  Do you have a fever, pain , or abdominal swelling? No. Pain Score  0 *  Have you tolerated food without any problems? Yes.    Have you been able to return to your normal activities? Yes.    Do you have any questions about your discharge instructions: Diet   No. Medications  No. Follow up visit  No.  Do you have questions or concerns about your Care? No.  Actions: * If pain score is 4 or above: No action needed, pain <4.

## 2014-10-05 ENCOUNTER — Other Ambulatory Visit: Payer: Self-pay

## 2014-10-05 ENCOUNTER — Telehealth: Payer: Self-pay | Admitting: Gastroenterology

## 2014-10-05 ENCOUNTER — Encounter (HOSPITAL_COMMUNITY): Payer: Self-pay | Admitting: Emergency Medicine

## 2014-10-05 DIAGNOSIS — N189 Chronic kidney disease, unspecified: Secondary | ICD-10-CM | POA: Diagnosis present

## 2014-10-05 DIAGNOSIS — R109 Unspecified abdominal pain: Secondary | ICD-10-CM | POA: Diagnosis not present

## 2014-10-05 DIAGNOSIS — K5720 Diverticulitis of large intestine with perforation and abscess without bleeding: Secondary | ICD-10-CM | POA: Diagnosis not present

## 2014-10-05 DIAGNOSIS — Z8249 Family history of ischemic heart disease and other diseases of the circulatory system: Secondary | ICD-10-CM

## 2014-10-05 DIAGNOSIS — E876 Hypokalemia: Secondary | ICD-10-CM | POA: Diagnosis present

## 2014-10-05 MED ORDER — ONDANSETRON HCL 4 MG PO TABS: 4.0000 mg | ORAL_TABLET | Freq: Three times a day (TID) | ORAL | Status: DC | PRN

## 2014-10-05 MED ORDER — ONDANSETRON HCL 4 MG PO TABS
4.0000 mg | ORAL_TABLET | Freq: Three times a day (TID) | ORAL | Status: DC | PRN
Start: 2014-10-05 — End: 2014-10-05

## 2014-10-05 NOTE — Telephone Encounter (Signed)
Patient has been fine until this morning. She was awakened by stomach cramps and nausea early this morning. She feels like she has chills, but no fever. She has not vomited but does not want to eat. Viral syndrome? Can we treat the nausea?

## 2014-10-05 NOTE — Telephone Encounter (Signed)
Patient advised. Discussed supportive care and diet. Rx to [facility] [place], [place].

## 2014-10-05 NOTE — ED Notes (Signed)
Pt. reports pain across her abdomen onset yesterday morning with nausea , denies emesis or diarrhea , no fever or chills.

## 2014-10-05 NOTE — Telephone Encounter (Signed)
Probable virus.  Can Rx zofran 4mg  q6h prn #15.  Should contact PCP for any further problems.

## 2014-10-06 ENCOUNTER — Emergency Department (HOSPITAL_COMMUNITY): Payer: 59

## 2014-10-06 ENCOUNTER — Inpatient Hospital Stay (HOSPITAL_COMMUNITY)
Admission: EM | Admit: 2014-10-06 | Discharge: 2014-10-10 | DRG: 392 | Disposition: A | Discharge status: Home / Self Care | Payer: 59 | Attending: Surgery | Admitting: Surgery

## 2014-10-06 ENCOUNTER — Telehealth: Payer: Self-pay | Admitting: Gastroenterology

## 2014-10-06 ENCOUNTER — Encounter (HOSPITAL_COMMUNITY): Payer: Self-pay | Admitting: Radiology

## 2014-10-06 DIAGNOSIS — K5720 Diverticulitis of large intestine with perforation and abscess without bleeding: Secondary | ICD-10-CM | POA: Diagnosis present

## 2014-10-06 DIAGNOSIS — K5780 Diverticulitis of intestine, part unspecified, with perforation and abscess without bleeding: Secondary | ICD-10-CM | POA: Diagnosis present

## 2014-10-06 DIAGNOSIS — N189 Chronic kidney disease, unspecified: Secondary | ICD-10-CM | POA: Diagnosis present

## 2014-10-06 DIAGNOSIS — R109 Unspecified abdominal pain: Secondary | ICD-10-CM | POA: Diagnosis present

## 2014-10-06 DIAGNOSIS — E876 Hypokalemia: Secondary | ICD-10-CM | POA: Diagnosis present

## 2014-10-06 DIAGNOSIS — K631 Perforation of intestine (nontraumatic): Secondary | ICD-10-CM

## 2014-10-06 DIAGNOSIS — Z8249 Family history of ischemic heart disease and other diseases of the circulatory system: Secondary | ICD-10-CM | POA: Diagnosis not present

## 2014-10-06 HISTORY — DX: Diverticulitis of intestine, part unspecified, with perforation and abscess without bleeding: K57.80

## 2014-10-06 LAB — COMPREHENSIVE METABOLIC PANEL
ALBUMIN: 4 g/dL (ref 3.5–5.0)
ALK PHOS: 54 U/L (ref 38–126)
ALT: 23 U/L (ref 14–54)
AST: 23 U/L (ref 15–41)
Anion gap: 10 (ref 5–15)
BUN: 10 mg/dL (ref 6–20)
CALCIUM: 9.6 mg/dL (ref 8.9–10.3)
CO2: 27 mmol/L (ref 22–32)
Chloride: 99 mmol/L — ABNORMAL LOW (ref 101–111)
Creatinine, Ser: 0.62 mg/dL (ref 0.44–1.00)
GFR calc Af Amer: >60 mL/min (ref >60)
Glucose, Bld: 148 mg/dL — ABNORMAL HIGH (ref 65–99)
Potassium: 4.1 mmol/L (ref 3.5–5.1)
Sodium: 136 mmol/L (ref 135–145)
TOTAL PROTEIN: 7.3 g/dL (ref 6.5–8.1)
Total Bilirubin: 1.4 mg/dL — ABNORMAL HIGH (ref 0.3–1.2)

## 2014-10-06 LAB — URINALYSIS, ROUTINE W REFLEX MICROSCOPIC
GLUCOSE, UA: NEGATIVE mg/dL
Ketones, ur: >80 mg/dL — AB
Nitrite: NEGATIVE
PH: 6 (ref 5.0–8.0)
Protein, ur: 30 mg/dL — AB
SPECIFIC GRAVITY, URINE: 1.02 (ref 1.005–1.030)
Urobilinogen, UA: 1 mg/dL (ref 0.0–1.0)

## 2014-10-06 LAB — CBC WITH DIFFERENTIAL/PLATELET
BASOS ABS: 0 10*3/uL (ref 0.0–0.1)
BASOS PCT: 0 % (ref 0–1)
Eosinophils Absolute: 0 10*3/uL (ref 0.0–0.7)
Eosinophils Relative: 0 % (ref 0–5)
HEMATOCRIT: 43.9 % (ref 36.0–46.0)
Hemoglobin: 15.1 g/dL — ABNORMAL HIGH (ref 12.0–15.0)
Lymphocytes Relative: 6 % — ABNORMAL LOW (ref 12–46)
Lymphs Abs: 0.9 10*3/uL (ref 0.7–4.0)
MCH: 30.5 pg (ref 26.0–34.0)
MCHC: 34.4 g/dL (ref 30.0–36.0)
MCV: 88.7 fL (ref 78.0–100.0)
MONO ABS: 0.7 10*3/uL (ref 0.1–1.0)
Monocytes Relative: 5 % (ref 3–12)
NEUTROS PCT: 89 % — AB (ref 43–77)
Neutro Abs: 12.7 10*3/uL — ABNORMAL HIGH (ref 1.7–7.7)
Platelets: 189 10*3/uL (ref 150–400)
RBC: 4.95 MIL/uL (ref 3.87–5.11)
RDW: 12.8 % (ref 11.5–15.5)
WBC: 14.2 10*3/uL — ABNORMAL HIGH (ref 4.0–10.5)

## 2014-10-06 LAB — URINE MICROSCOPIC-ADD ON

## 2014-10-06 LAB — TYPE AND SCREEN
ABO/RH(D): A NEG
Antibody Screen: NEGATIVE

## 2014-10-06 LAB — LIPASE, BLOOD: Lipase: 43 U/L (ref 22–51)

## 2014-10-06 LAB — ABO/RH: ABO/RH(D): A NEG

## 2014-10-06 MED ORDER — MORPHINE SULFATE 4 MG/ML IJ SOLN
4.0000 mg | Freq: Once | INTRAMUSCULAR | Status: AC
  Administered 2014-10-06: 4 mg via INTRAVENOUS
  Filled 2014-10-06: qty 1

## 2014-10-06 MED ORDER — HYDROMORPHONE HCL 1 MG/ML IJ SOLN
1.0000 mg | INTRAMUSCULAR | Status: DC | PRN
  Administered 2014-10-06 – 2014-10-08 (×11): 1 mg via INTRAVENOUS
  Filled 2014-10-06 (×12): qty 1

## 2014-10-06 MED ORDER — IOHEXOL 300 MG/ML  SOLN
100.0000 mL | Freq: Once | INTRAMUSCULAR | Status: AC | PRN
  Administered 2014-10-06: 100 mL via INTRAVENOUS

## 2014-10-06 MED ORDER — ENOXAPARIN SODIUM 40 MG/0.4ML ~~LOC~~ SOLN
40.0000 mg | Freq: Every day | SUBCUTANEOUS | Status: DC
  Administered 2014-10-06 – 2014-10-09 (×4): 40 mg via SUBCUTANEOUS
  Filled 2014-10-06 (×7): qty 0.4

## 2014-10-06 MED ORDER — ONDANSETRON HCL 4 MG/2ML IJ SOLN
4.0000 mg | Freq: Once | INTRAMUSCULAR | Status: AC
  Administered 2014-10-06: 4 mg via INTRAVENOUS
  Filled 2014-10-06: qty 2

## 2014-10-06 MED ORDER — SODIUM CHLORIDE 0.9 % IV BOLUS (SEPSIS)
1000.0000 mL | Freq: Once | INTRAVENOUS | Status: AC
  Administered 2014-10-06: 1000 mL via INTRAVENOUS

## 2014-10-06 MED ORDER — ONDANSETRON HCL 4 MG/2ML IJ SOLN
4.0000 mg | Freq: Four times a day (QID) | INTRAMUSCULAR | Status: DC | PRN
  Administered 2014-10-06: 4 mg via INTRAVENOUS
  Filled 2014-10-06 (×3): qty 2

## 2014-10-06 MED ORDER — PIPERACILLIN-TAZOBACTAM 3.375 G IVPB 30 MIN
3.3750 g | Freq: Once | INTRAVENOUS | Status: AC
  Administered 2014-10-06: 3.375 g via INTRAVENOUS
  Filled 2014-10-06: qty 50

## 2014-10-06 MED ORDER — ACETAMINOPHEN 10 MG/ML IV SOLN
1000.0000 mg | Freq: Four times a day (QID) | INTRAVENOUS | Status: AC | PRN
  Filled 2014-10-06: qty 100

## 2014-10-06 MED ORDER — DEXTROSE IN LACTATED RINGERS 5 % IV SOLN
INTRAVENOUS | Status: DC
  Administered 2014-10-06 (×2): via INTRAVENOUS
  Administered 2014-10-07: 1000 mL via INTRAVENOUS
  Administered 2014-10-07 – 2014-10-08 (×2): via INTRAVENOUS

## 2014-10-06 MED ORDER — PIPERACILLIN-TAZOBACTAM 3.375 G IVPB
3.3750 g | Freq: Three times a day (TID) | INTRAVENOUS | Status: DC
  Administered 2014-10-06 – 2014-10-09 (×9): 3.375 g via INTRAVENOUS
  Filled 2014-10-06 (×11): qty 50

## 2014-10-06 NOTE — ED Notes (Signed)
CT notified that pt has finished contrast drink

## 2014-10-06 NOTE — ED Notes (Signed)
PA at bedside.

## 2014-10-06 NOTE — Telephone Encounter (Signed)
Mrs. Linda Richard was admitted to the hospital with abdominal pain and abdominal perforation.  This was several days following an incomplete colonoscopy followed by a barium enema.  [name] was concerned because he was told by the PA in the ER that she was going to require immediate surgeries.  She was seen by Dr. Marca Anconaom Cornett from general surgery who felt that the patient had acute diverticulitis with microperforation and that they will attempt to treat her medically.  If not improved then she would require surgery.  Her husband had concerns about conflicting information and recommendations.  I reviewed the films and clinical course and agreed that she most likely has acute diverticulitis.  I conveyed that I thought it was reasonable to watch her for 24 hours to see whether there is clinical improvement.  If not then surgery would be seriously considered.  He was also concerned that she has not been seen since early this morning and inquired whether surgery would see her again today.  I told him that I would contact general surgery and request that she be seen again today.  In addition, I will come by and see her in the morning.

## 2014-10-06 NOTE — Progress Notes (Signed)
Admitted to 4N04 from ED via stretcher.  VSS, oriented to room, safety precautions & orders.  Pt & spouse demo understanding.  IV infusing & pt NPO.

## 2014-10-06 NOTE — ED Provider Notes (Signed)
Patient seen/examined in the Emergency Department in conjunction with Midlevel Provider  Patient reports abdominal pain Exam : awake/alert, appropriate.   Plan: admit for pneumoperitoneum surgery has been consulted    Zadie Rhineonald Kelise Kuch, MD 10/06/14 [phone]

## 2014-10-06 NOTE — ED Provider Notes (Signed)
CSN: [id number]     Arrival date & time 10/05/14  2337 History   First MD Initiated Contact with Patient 10/06/14 0032     Chief Complaint  Patient presents with  . Abdominal Pain     (Consider location/radiation/quality/duration/timing/severity/associated sxs/prior Treatment) HPI Comments: Patient is a 64 year old healthy female with no significant past medical history who presents with abdominal pain that started this morning. The pain is located in her lower abdomen and does not radiate. The pain is described as aching and severe. The pain started gradually and progressively worsened since the onset. No alleviating/aggravating factors. The patient has tried nothing for symptoms without relief. Associated symptoms include nausea. Patient denies fever, headache, vomiting, diarrhea, chest pain, SOB, dysuria, constipation, abnormal vaginal bleeding/discharge. Patient reports having a colonoscopy 5 days ago by Dr. Arlyce Dice. Patient was unable to complete the study because her "intestines were too twisty." She was sent for a barium enema later that same day. Patient felt well after the procedure until today.      Past Medical History  Diagnosis Date  . DVT (deep vein thrombosis) in pregnancy   . Varicose veins     strip and laser  . History of renal stone     2011  . Normal echocardiogram 2011    mild lvh ? aortic sclerosis no as    . Chronic kidney disease 2012    kidney stone   Past Surgical History  Procedure Laterality Date  . Tonsillectomy    . Cesarean section    . Veins stripped      laser RX   . Colonoscopy     Family History  Problem Relation Age of Onset  . Hypertension    . Kidney disease      renal stones  . Arthritis    . Cancer Father     bladder   68s   . Ovarian cancer Mother     29s  . Other      son age 2 irreg HB  had cardioversion   . Thyroid disease    . Colon polyps Neg Hx   . Colon cancer Neg Hx   . Rectal cancer Neg Hx   . Stomach cancer Neg Hx     History  Substance Use Topics  . Smoking status: Never Smoker   . Smokeless tobacco: Never Used  . Alcohol Use: 0.0 oz/week    0 Standard drinks or equivalent per week     Comment: 3 x a week, mostly wine no t daily]   OB History    No data available     Review of Systems  Constitutional: Negative for fever, chills and fatigue.  HENT: Negative for trouble swallowing.   Eyes: Negative for visual disturbance.  Respiratory: Negative for shortness of breath.   Cardiovascular: Negative for chest pain and palpitations.  Gastrointestinal: Positive for nausea. Negative for vomiting, abdominal pain and diarrhea.  Genitourinary: Negative for dysuria and difficulty urinating.  Musculoskeletal: Negative for arthralgias and neck pain.  Skin: Negative for color change.  Neurological: Negative for dizziness and weakness.  Psychiatric/Behavioral: Negative for dysphoric mood.      Allergies  Review of patient's allergies indicates no known allergies.  Home Medications   Prior to Admission medications   Medication Sig Start Date End Date Taking? Authorizing Provider  aspirin 81 MG tablet Take 81 mg by mouth daily.      Historical Provider, MD  calcium-vitamin D (OSCAL WITH D) 500-200 MG-UNIT  per tablet Take 1 tablet by mouth daily.      Historical Provider, MD  cholecalciferol (VITAMIN D) 1000 UNITS tablet Take 1,000 Units by mouth daily.      Historical Provider, MD  ibuprofen (ADVIL,MOTRIN) 200 MG tablet Take 200 mg by mouth every 6 (six) hours as needed.    Historical Provider, MD  Multiple Vitamins-Minerals (ONE DAILY WOMENS 50+ PO) Take 1 tablet by mouth daily.    Historical Provider, MD  ondansetron (ZOFRAN) 4 MG tablet Take 1 tablet (4 mg total) by mouth every 8 (eight) hours as needed for nausea or vomiting. 10/05/14   Louis Meckelobert D Kaplan, MD  Probiotic Product (PROBIOTIC DAILY PO) Take by mouth.    Historical Provider, MD  vitamin C (ASCORBIC ACID) 500 MG tablet Take 500 mg by mouth  daily.    Historical Provider, MD   BP 117/51 mmHg  Pulse 71  Temp(Src) 98.4 F (36.9 C) (Oral)  Resp 20  Ht 5\' 9"  (1.753 m)  Wt 124 lb (56.246 kg)  BMI 18.30 kg/m2  SpO2 98% Physical Exam  Constitutional: She is oriented to person, place, and time. She appears well-developed and well-nourished. No distress.  HENT:  Head: Normocephalic and atraumatic.  Eyes: Conjunctivae and EOM are normal.  Neck: Normal range of motion.  Cardiovascular: Normal rate and regular rhythm.  Exam reveals no gallop and no friction rub.   No murmur heard. Pulmonary/Chest: Effort normal and breath sounds normal. She has no wheezes. She has no rales. She exhibits no tenderness.  Abdominal: Soft. She exhibits no distension. There is tenderness. There is no rebound and no guarding.  Generalized lower abdominal tenderness to palpation. No focal tenderness to palpation.   Musculoskeletal: Normal range of motion.  Neurological: She is alert and oriented to person, place, and time. Coordination normal.  Speech is goal-oriented. Moves limbs without ataxia.   Skin: Skin is warm and dry.  Psychiatric: She has a normal mood and affect. Her behavior is normal.  Nursing note and vitals reviewed.   ED Course  Procedures (including critical care time)  CRITICAL CARE Performed by: Emilia BeckKaitlyn Jakevious Hollister   Total critical care time: 35 min  Critical care time was exclusive of separately billable procedures and treating other patients.  Critical care was necessary to treat or prevent imminent or life-threatening deterioration.  Critical care was time spent personally by me on the following activities: development of treatment plan with patient and/or surrogate as well as nursing, discussions with consultants, evaluation of patient's response to treatment, examination of patient, obtaining history from patient or surrogate, ordering and performing treatments and interventions, ordering and review of laboratory studies,  ordering and review of radiographic studies, pulse oximetry and re-evaluation of patient's condition.   Labs Review Labs Reviewed  CBC WITH DIFFERENTIAL/PLATELET - Abnormal; Notable for the following:    WBC 14.2 (*)    Hemoglobin 15.1 (*)    Neutrophils Relative % 89 (*)    Neutro Abs 12.7 (*)    Lymphocytes Relative 6 (*)    All other components within normal limits  COMPREHENSIVE METABOLIC PANEL - Abnormal; Notable for the following:    Chloride 99 (*)    Glucose, Bld 148 (*)    Total Bilirubin 1.4 (*)    All other components within normal limits  URINALYSIS, ROUTINE W REFLEX MICROSCOPIC - Abnormal; Notable for the following:    Color, Urine AMBER (*)    Hgb urine dipstick MODERATE (*)    Bilirubin Urine SMALL (*)  Ketones, ur >80 (*)    Protein, ur 30 (*)    Leukocytes, UA SMALL (*)    All other components within normal limits  URINE MICROSCOPIC-ADD ON - Abnormal; Notable for the following:    Casts HYALINE CASTS (*)    All other components within normal limits  LIPASE, BLOOD    Imaging Review Ct Abdomen Pelvis W Contrast  10/06/2014   CLINICAL DATA:  Acute onset of generalized abdominal pain, with nausea. Initial encounter.  EXAM: CT ABDOMEN AND PELVIS WITH CONTRAST  TECHNIQUE: Multidetector CT imaging of the abdomen and pelvis was performed using the standard protocol following bolus administration of intravenous contrast.  CONTRAST:  100mL OMNIPAQUE IOHEXOL 300 MG/ML  SOLN  COMPARISON:  CT of the abdomen and pelvis from 05/02/2010  FINDINGS: The visualized lung bases are clear.  Scattered free intra-abdominal air is noted throughout the upper abdomen, tracking about small bowel loops. There appears to be a 3.0 x 2.7 x 1.5 cm collection of fluid and air at the upper hemipelvis, with mild surrounding soft tissue edema. This may reflect the site of perforation, along the mid to distal sigmoid colon. Trace fluid is noted within the pelvis.  There is mild prominence of the  intrahepatic biliary ducts. A stone is noted within the gallbladder. The gallbladder is otherwise unremarkable. The pancreas and adrenal glands are unremarkable.  Mild bilateral renal pelvicaliectasis remains within normal limits, without definite hydronephrosis. A 3 mm nonobstructing stone is noted at the upper pole of the left kidney. The kidneys are otherwise unremarkable. No obstructing ureteral stones are seen. No perinephric stranding is appreciated.  No free fluid is identified. The small bowel is unremarkable in appearance. The stomach is within normal limits. No acute vascular abnormalities are seen. Prominent collateral vessels are noted along the anterior abdominal wall.  The appendix is not definitely characterized; there is no evidence of appendicitis. Contrast is noted within the colon, with residual barium noted in diverticula along the sigmoid colon.  The bladder is mildly distended and grossly unremarkable.  There is a nodular appearance to the cervix, though this may simply reflect surrounding vasculature. There is diffuse prominence of the periuterine vasculature, of uncertain significance. Would correlate with Pap smear, to exclude malignancy. This could reflect pelvic congestion syndrome in the appropriate clinical situation. No inguinal lymphadenopathy is seen.  No acute osseous abnormalities are identified. Multilevel vacuum phenomenon is noted along the lumbar spine. There is slight chronic loss of height at vertebral body T12.  IMPRESSION: 1. Bowel perforation, with scattered free intra-abdominal air throughout the upper abdomen, tracking about small bowel loops. This appears to reflect a perforation at the mid to distal sigmoid colon, given a 3.0 x 2.7 x 1.5 cm collection of fluid and air at the upper hemipelvis adjacent to the sigmoid colon, with mild surrounding soft tissue edema. 2. Nodular appearance to the cervix, though this may simply reflect surrounding vasculature. Diffuse  prominence of the periuterine vasculature, of uncertain significance. Would correlate with Pap smear, to exclude malignancy. This could reflect pelvic congestion syndrome in the appropriate clinical situation. 3. Mild prominence of the intrahepatic biliary ducts. Stone noted within the gallbladder. Gallbladder otherwise unremarkable in appearance. 4. 3 mm nonobstructing stone at the upper pole of the left kidney. 5. Prominent collateral vessels noted along the anterior abdominal wall.  Critical Value/emergent results were called by telephone at the time of interpretation on 10/06/2014 at 2:40 am to [facility] Shimika Ames PA, who verbally acknowledged these results.  Electronically Signed   By: Roanna Raider M.D.   On: 10/06/2014 02:49     EKG Interpretation   Date/Time:  Thursday Oct 06 2014 01:04:05 EDT Ventricular Rate:  78 PR Interval:  168 QRS Duration: 81 QT Interval:  389 QTC Calculation: 443 R Axis:   83 Text Interpretation:  Sinus rhythm Biatrial enlargement Borderline right  axis deviation Abnormal ekg No previous ECGs available artifact noted  Confirmed by Bebe Shaggy  MD, DONALD ([id number]) on 10/06/2014 1:14:15 AM      MDM   Final diagnoses:  Bowel perforation    2:13 AM Labs show elevated WBC at 14.2. Vitals stable and patient afebrile. CT abdomen pelvis pending.   2:55 AM Patient's CT shows bowel perforation at the sigmoid colon. I spoke with Dr. Luisa Hart who will see the patient when done in the OR. Vitals stable and patient afebrile.   Emilia Beck, PA-C 10/06/14 0354  Zadie Rhine, MD 10/06/14 [phone]

## 2014-10-06 NOTE — ED Notes (Signed)
Pt ambulating to rest room.

## 2014-10-06 NOTE — Progress Notes (Signed)
Attempt to contact [facility], MD r/t patient's status and plan. Per Tresa EndoKelly (trauma stff) will notify appropriate personnel and will return call to RN.   Sim BoastHavy, RN

## 2014-10-06 NOTE — Progress Notes (Signed)
Patient has questions for MD and Dr. Janee Mornhompson notified. Currently in surgery and will see patient later.

## 2014-10-06 NOTE — H&P (Signed)
Linda Richard is an 64 y.o. female.   Chief Complaint: abdominal pain HPI: Pt with1 day hx of crampy abdominal pain and nausea. She is 5 days out from attempted colonoscopy and Air contrast BE by Dr Deatra Ina.He could not pass colonoscopy pass sigmoid colon due to angulation.  BE was performed the same day which showed diverticular disease but no obstruction.  She develpoed last nigh some mild nausea and abdominal pain. This am much worse with nausea.  Pain in lower abdomen diffuse crampy.  She called Dr Kelby Fam office and they felt this was viral.  Her pain persisted and she sought care in the ED.  CT shoed locules of free air and inflammation in the pelvis and thickened sigmoid colon.    Past Medical History  Diagnosis Date  . DVT (deep vein thrombosis) in pregnancy   . Varicose veins     strip and laser  . History of renal stone     2011  . Normal echocardiogram 2011    mild lvh ? aortic sclerosis no as    . Chronic kidney disease 2012    kidney stone    Past Surgical History  Procedure Laterality Date  . Tonsillectomy    . Cesarean section    . Veins stripped      laser RX   . Colonoscopy      Family History  Problem Relation Age of Onset  . Hypertension    . Kidney disease      renal stones  . Arthritis    . Cancer Father     bladder   28s   . Ovarian cancer Mother     65s  . Other      son age 67 irreg HB  had cardioversion   . Thyroid disease    . Colon polyps Neg Hx   . Colon cancer Neg Hx   . Rectal cancer Neg Hx   . Stomach cancer Neg Hx    Social History:  reports that she has never smoked. She has never used smokeless tobacco. She reports that she drinks alcohol. She reports that she does not use illicit drugs.  Allergies: No Known Allergies   (Not in a hospital admission)  Results for orders placed or performed during the hospital encounter of 10/06/14 (from the past 48 hour(s))  CBC with Differential     Status: Abnormal   Collection Time: 10/05/14  11:54 PM  Result Value Ref Range   WBC 14.2 (H) 4.0 - 10.5 K/uL   RBC 4.95 3.87 - 5.11 MIL/uL   Hemoglobin 15.1 (H) 12.0 - 15.0 g/dL   HCT 43.9 36.0 - 46.0 %   MCV 88.7 78.0 - 100.0 fL   MCH 30.5 26.0 - 34.0 pg   MCHC 34.4 30.0 - 36.0 g/dL   RDW 12.8 11.5 - 15.5 %   Platelets 189 150 - 400 K/uL   Neutrophils Relative % 89 (H) 43 - 77 %   Neutro Abs 12.7 (H) 1.7 - 7.7 K/uL   Lymphocytes Relative 6 (L) 12 - 46 %   Lymphs Abs 0.9 0.7 - 4.0 K/uL   Monocytes Relative 5 3 - 12 %   Monocytes Absolute 0.7 0.1 - 1.0 K/uL   Eosinophils Relative 0 0 - 5 %   Eosinophils Absolute 0.0 0.0 - 0.7 K/uL   Basophils Relative 0 0 - 1 %   Basophils Absolute 0.0 0.0 - 0.1 K/uL  Comprehensive metabolic panel  Status: Abnormal   Collection Time: 10/05/14 11:54 PM  Result Value Ref Range   Sodium 136 135 - 145 mmol/L   Potassium 4.1 3.5 - 5.1 mmol/L   Chloride 99 (L) 101 - 111 mmol/L   CO2 27 22 - 32 mmol/L   Glucose, Bld 148 (H) 65 - 99 mg/dL   BUN 10 6 - 20 mg/dL   Creatinine, Ser 0.62 0.44 - 1.00 mg/dL   Calcium 9.6 8.9 - 10.3 mg/dL   Total Protein 7.3 6.5 - 8.1 g/dL   Albumin 4.0 3.5 - 5.0 g/dL   AST 23 15 - 41 U/L   ALT 23 14 - 54 U/L   Alkaline Phosphatase 54 38 - 126 U/L   Total Bilirubin 1.4 (H) 0.3 - 1.2 mg/dL   GFR calc non Af Amer >60 >60 mL/min   GFR calc Af Amer >60 >60 mL/min    Comment: (NOTE) The eGFR has been calculated using the CKD EPI equation. This calculation has not been validated in all clinical situations. eGFR's persistently <60 mL/min signify possible Chronic Kidney Disease.    Anion gap 10 5 - 15  Lipase, blood     Status: None   Collection Time: 10/05/14 11:54 PM  Result Value Ref Range   Lipase 43 22 - 51 U/L  Urinalysis, Routine w reflex microscopic     Status: Abnormal   Collection Time: 10/05/14 11:59 PM  Result Value Ref Range   Color, Urine AMBER (A) YELLOW    Comment: BIOCHEMICALS MAY BE AFFECTED BY COLOR   APPearance CLEAR CLEAR   Specific  Gravity, Urine 1.020 1.005 - 1.030   pH 6.0 5.0 - 8.0   Glucose, UA NEGATIVE NEGATIVE mg/dL   Hgb urine dipstick MODERATE (A) NEGATIVE   Bilirubin Urine SMALL (A) NEGATIVE   Ketones, ur >80 (A) NEGATIVE mg/dL   Protein, ur 30 (A) NEGATIVE mg/dL   Urobilinogen, UA 1.0 0.0 - 1.0 mg/dL   Nitrite NEGATIVE NEGATIVE   Leukocytes, UA SMALL (A) NEGATIVE  Urine microscopic-add on     Status: Abnormal   Collection Time: 10/05/14 11:59 PM  Result Value Ref Range   Squamous Epithelial / LPF RARE RARE   WBC, UA 0-2 <3 WBC/hpf   RBC / HPF 3-6 <3 RBC/hpf   Bacteria, UA RARE RARE   Casts HYALINE CASTS (A) NEGATIVE   Urine-Other MUCOUS PRESENT   Type and screen     Status: None (Preliminary result)   Collection Time: 10/06/14  3:46 AM  Result Value Ref Range   ABO/RH(D) A NEG    Antibody Screen PENDING    Sample Expiration 10/09/2014    Ct Abdomen Pelvis W Contrast  10/06/2014   CLINICAL DATA:  Acute onset of generalized abdominal pain, with nausea. Initial encounter.  EXAM: CT ABDOMEN AND PELVIS WITH CONTRAST  TECHNIQUE: Multidetector CT imaging of the abdomen and pelvis was performed using the standard protocol following bolus administration of intravenous contrast.  CONTRAST:  151mL OMNIPAQUE IOHEXOL 300 MG/ML  SOLN  COMPARISON:  CT of the abdomen and pelvis from 05/02/2010  FINDINGS: The visualized lung bases are clear.  Scattered free intra-abdominal air is noted throughout the upper abdomen, tracking about small bowel loops. There appears to be a 3.0 x 2.7 x 1.5 cm collection of fluid and air at the upper hemipelvis, with mild surrounding soft tissue edema. This may reflect the site of perforation, along the mid to distal sigmoid colon. Trace fluid is noted within the pelvis.  There is mild prominence of the intrahepatic biliary ducts. A stone is noted within the gallbladder. The gallbladder is otherwise unremarkable. The pancreas and adrenal glands are unremarkable.  Mild bilateral renal  pelvicaliectasis remains within normal limits, without definite hydronephrosis. A 3 mm nonobstructing stone is noted at the upper pole of the left kidney. The kidneys are otherwise unremarkable. No obstructing ureteral stones are seen. No perinephric stranding is appreciated.  No free fluid is identified. The small bowel is unremarkable in appearance. The stomach is within normal limits. No acute vascular abnormalities are seen. Prominent collateral vessels are noted along the anterior abdominal wall.  The appendix is not definitely characterized; there is no evidence of appendicitis. Contrast is noted within the colon, with residual barium noted in diverticula along the sigmoid colon.  The bladder is mildly distended and grossly unremarkable.  There is a nodular appearance to the cervix, though this may simply reflect surrounding vasculature. There is diffuse prominence of the periuterine vasculature, of uncertain significance. Would correlate with Pap smear, to exclude malignancy. This could reflect pelvic congestion syndrome in the appropriate clinical situation. No inguinal lymphadenopathy is seen.  No acute osseous abnormalities are identified. Multilevel vacuum phenomenon is noted along the lumbar spine. There is slight chronic loss of height at vertebral body T12.  IMPRESSION: 1. Bowel perforation, with scattered free intra-abdominal air throughout the upper abdomen, tracking about small bowel loops. This appears to reflect a perforation at the mid to distal sigmoid colon, given a 3.0 x 2.7 x 1.5 cm collection of fluid and air at the upper hemipelvis adjacent to the sigmoid colon, with mild surrounding soft tissue edema. 2. Nodular appearance to the cervix, though this may simply reflect surrounding vasculature. Diffuse prominence of the periuterine vasculature, of uncertain significance. Would correlate with Pap smear, to exclude malignancy. This could reflect pelvic congestion syndrome in the appropriate  clinical situation. 3. Mild prominence of the intrahepatic biliary ducts. Stone noted within the gallbladder. Gallbladder otherwise unremarkable in appearance. 4. 3 mm nonobstructing stone at the upper pole of the left kidney. 5. Prominent collateral vessels noted along the anterior abdominal wall.  Critical Value/emergent results were called by telephone at the time of interpretation on 10/06/2014 at 2:40 am to [facility] PA, who verbally acknowledged these results.   Electronically Signed   By: Garald Balding M.D.   On: 10/06/2014 02:49    Review of Systems  Constitutional: Positive for fever, chills and malaise/fatigue.  Cardiovascular: Negative.   Gastrointestinal: Positive for nausea and abdominal pain.  Genitourinary: Negative.   Musculoskeletal: Negative.   Skin: Negative.   Neurological: Negative.   Psychiatric/Behavioral: Negative.     Blood pressure 117/51, pulse 71, temperature 98.4 F (36.9 C), temperature source Oral, resp. rate 20, height _0  (1.753 m), weight 56.246 kg (124 lb), SpO2 98 %. Physical Exam  Constitutional: She is oriented to person, place, and time. She appears well-developed and well-nourished.  HENT:  Head: Normocephalic.  Eyes: Pupils are equal, round, and reactive to light. No scleral icterus.  Neck: Normal range of motion.  Cardiovascular: Normal rate and regular rhythm.   Respiratory: Effort normal and breath sounds normal.  GI: She exhibits no mass. There is tenderness in the right lower quadrant, suprapubic area and left lower quadrant. There is guarding. There is no rigidity and no rebound.  Musculoskeletal: Normal range of motion.  Neurological: She is alert and oriented to person, place, and time.  Skin: Skin is warm and dry.  Psychiatric: She has a normal mood and affect. Her behavior is normal. Judgment and thought content normal.     Assessment/Plan Diverticulitis with microperforation  Given timing of symptoms and no polypectomy  done and the fact that her  colonoscopy was followed by BE with air,  I would favor diverticulitis over iatrogenic injury from colonoscopy since it has been 5 days and symptoms came on over 12 - 18 hour window.  HD stable and no emergent need for surgery.  Start zosyn and keep NPO for now.  On her BE she has significant diverticular disease and kinking of the sigmoid colon.  This was seen on colonoscopy and inability to pass colonoscope.  No obstruction on BE.  DOW to follow. If she worsens,  Will need ex lap and colostomy. Pt husband understand plan and the pro and cons to medical and surgical treatments.   Keltin Baird A. 10/06/2014, 4:24 AM

## 2014-10-07 LAB — COMPREHENSIVE METABOLIC PANEL
ALBUMIN: 3 g/dL — AB (ref 3.5–5.0)
ALK PHOS: 57 U/L (ref 38–126)
ALT: 17 U/L (ref 14–54)
ANION GAP: 8 (ref 5–15)
AST: 17 U/L (ref 15–41)
BILIRUBIN TOTAL: 1.3 mg/dL — AB (ref 0.3–1.2)
BUN: 7 mg/dL (ref 6–20)
CO2: 27 mmol/L (ref 22–32)
Calcium: 9.2 mg/dL (ref 8.9–10.3)
Chloride: 100 mmol/L — ABNORMAL LOW (ref 101–111)
Creatinine, Ser: 0.56 mg/dL (ref 0.44–1.00)
GFR calc non Af Amer: >60 mL/min (ref >60)
GLUCOSE: 105 mg/dL — AB (ref 65–99)
POTASSIUM: 3.5 mmol/L (ref 3.5–5.1)
Sodium: 135 mmol/L (ref 135–145)
TOTAL PROTEIN: 6.2 g/dL — AB (ref 6.5–8.1)

## 2014-10-07 LAB — CBC
HCT: 42.6 % (ref 36.0–46.0)
Hemoglobin: 14.1 g/dL (ref 12.0–15.0)
MCH: 29.9 pg (ref 26.0–34.0)
MCHC: 33.1 g/dL (ref 30.0–36.0)
MCV: 90.3 fL (ref 78.0–100.0)
PLATELETS: 156 10*3/uL (ref 150–400)
RBC: 4.72 MIL/uL (ref 3.87–5.11)
RDW: 13 % (ref 11.5–15.5)
WBC: 14.1 10*3/uL — ABNORMAL HIGH (ref 4.0–10.5)

## 2014-10-07 MED ORDER — POTASSIUM CHLORIDE CRYS ER 20 MEQ PO TBCR
20.0000 meq | EXTENDED_RELEASE_TABLET | Freq: Two times a day (BID) | ORAL | Status: AC
  Administered 2014-10-07 (×2): 20 meq via ORAL
  Filled 2014-10-07 (×2): qty 1

## 2014-10-07 NOTE — Progress Notes (Signed)
Subjective: Pain has improved a lot, passing gas  Objective: Vital signs in last 24 hours: Temp:  [98.7 F (37.1 C)-101.6 F (38.7 C)] 98.7 F (37.1 C) (05/20 0608) Pulse Rate:  [85-95] 94 (05/20 0608) Resp:  [18] 18 (05/20 0608) BP: (99-126)/(42-53) 110/52 mmHg (05/20 0608) SpO2:  [93 %-98 %] 94 % (05/20 0608) Last BM Date: 10/05/14  Intake/Output from previous day: 05/19 0701 - 05/20 0700 In: -  Out: 200 [Urine:200] Intake/Output this shift:    General appearance: alert and cooperative Resp: clear to auscultation bilaterally Cardio: regular rate and rhythm GI: soft, mild tenderness LLQ without gaurding, +BS  Lab Results:   Recent Labs  10/05/14 2354 10/07/14 0544  WBC 14.2* 14.1*  HGB 15.1* 14.1  HCT 43.9 42.6  PLT 189 156   BMET  Recent Labs  10/05/14 2354 10/07/14 0544  NA 136 135  K 4.1 3.5  CL 99* 100*  CO2 27 27  GLUCOSE 148* 105*  BUN 10 7  CREATININE 0.62 0.56  CALCIUM 9.6 9.2   PT/INR No results for input(s): LABPROT, INR in the last 72 hours. ABG No results for input(s): PHART, HCO3 in the last 72 hours.  Invalid input(s): PCO2, PO2  Studies/Results: Ct Abdomen Pelvis W Contrast  10/06/2014   CLINICAL DATA:  Acute onset of generalized abdominal pain, with nausea. Initial encounter.  EXAM: CT ABDOMEN AND PELVIS WITH CONTRAST  TECHNIQUE: Multidetector CT imaging of the abdomen and pelvis was performed using the standard protocol following bolus administration of intravenous contrast.  CONTRAST:  100mL OMNIPAQUE IOHEXOL 300 MG/ML  SOLN  COMPARISON:  CT of the abdomen and pelvis from 05/02/2010  FINDINGS: The visualized lung bases are clear.  Scattered free intra-abdominal air is noted throughout the upper abdomen, tracking about small bowel loops. There appears to be a 3.0 x 2.7 x 1.5 cm collection of fluid and air at the upper hemipelvis, with mild surrounding soft tissue edema. This may reflect the site of perforation, along the mid to  distal sigmoid colon. Trace fluid is noted within the pelvis.  There is mild prominence of the intrahepatic biliary ducts. A stone is noted within the gallbladder. The gallbladder is otherwise unremarkable. The pancreas and adrenal glands are unremarkable.  Mild bilateral renal pelvicaliectasis remains within normal limits, without definite hydronephrosis. A 3 mm nonobstructing stone is noted at the upper pole of the left kidney. The kidneys are otherwise unremarkable. No obstructing ureteral stones are seen. No perinephric stranding is appreciated.  No free fluid is identified. The small bowel is unremarkable in appearance. The stomach is within normal limits. No acute vascular abnormalities are seen. Prominent collateral vessels are noted along the anterior abdominal wall.  The appendix is not definitely characterized; there is no evidence of appendicitis. Contrast is noted within the colon, with residual barium noted in diverticula along the sigmoid colon.  The bladder is mildly distended and grossly unremarkable.  There is a nodular appearance to the cervix, though this may simply reflect surrounding vasculature. There is diffuse prominence of the periuterine vasculature, of uncertain significance. Would correlate with Pap smear, to exclude malignancy. This could reflect pelvic congestion syndrome in the appropriate clinical situation. No inguinal lymphadenopathy is seen.  No acute osseous abnormalities are identified. Multilevel vacuum phenomenon is noted along the lumbar spine. There is slight chronic loss of height at vertebral body T12.  IMPRESSION: 1. Bowel perforation, with scattered free intra-abdominal air throughout the upper abdomen, tracking about small bowel loops. This  appears to reflect a perforation at the mid to distal sigmoid colon, given a 3.0 x 2.7 x 1.5 cm collection of fluid and air at the upper hemipelvis adjacent to the sigmoid colon, with mild surrounding soft tissue edema. 2. Nodular  appearance to the cervix, though this may simply reflect surrounding vasculature. Diffuse prominence of the periuterine vasculature, of uncertain significance. Would correlate with Pap smear, to exclude malignancy. This could reflect pelvic congestion syndrome in the appropriate clinical situation. 3. Mild prominence of the intrahepatic biliary ducts. Stone noted within the gallbladder. Gallbladder otherwise unremarkable in appearance. 4. 3 mm nonobstructing stone at the upper pole of the left kidney. 5. Prominent collateral vessels noted along the anterior abdominal wall.  Critical Value/emergent results were called by telephone at the time of interpretation on 10/06/2014 at 2:40 am to [facility], who verbally acknowledged these results.   Electronically Signed   By: Roanna RaiderJeffery  Chang M.D.   On: 10/06/2014 02:49    Anti-infectives: Anti-infectives    Start     Dose/Rate Route Frequency Ordered Stop   10/06/14 0600  piperacillin-tazobactam (ZOSYN) IVPB 3.375 g     3.375 g 12.5 mL/hr over 240 Minutes Intravenous 3 times per day 10/06/14 0439     10/06/14 0315  piperacillin-tazobactam (ZOSYN) IVPB 3.375 g     3.375 g 100 mL/hr over 30 Minutes Intravenous  Once 10/06/14 0305 10/06/14 0359      Assessment/Plan: Sigmoid diverticulitis with small perforation - exam and symptoms have improved, fever yesterday, WBC about the same, allow sips only, continue Zosyn. CBC in AM VTE - Lovenox FEN - sips, replace mild hypokalemia  LOS: 1 day    Staci Carver E 10/07/2014

## 2014-10-07 NOTE — Progress Notes (Signed)
Report received from Daphene RN.

## 2014-10-07 NOTE — Progress Notes (Signed)
Patient would like to take a shower in the AM will tell on coming day nurse to obtain an order.

## 2014-10-08 LAB — CBC
HCT: 39.9 % (ref 36.0–46.0)
HEMOGLOBIN: 13.4 g/dL (ref 12.0–15.0)
MCH: 30.2 pg (ref 26.0–34.0)
MCHC: 33.6 g/dL (ref 30.0–36.0)
MCV: 90.1 fL (ref 78.0–100.0)
Platelets: 158 10*3/uL (ref 150–400)
RBC: 4.43 MIL/uL (ref 3.87–5.11)
RDW: 12.8 % (ref 11.5–15.5)
WBC: 9.2 10*3/uL (ref 4.0–10.5)

## 2014-10-08 LAB — BASIC METABOLIC PANEL
Anion gap: 8 (ref 5–15)
CALCIUM: 9 mg/dL (ref 8.9–10.3)
CO2: 28 mmol/L (ref 22–32)
CREATININE: 0.44 mg/dL (ref 0.44–1.00)
Chloride: 99 mmol/L — ABNORMAL LOW (ref 101–111)
GFR calc Af Amer: >60 mL/min (ref >60)
GFR calc non Af Amer: >60 mL/min (ref >60)
GLUCOSE: 110 mg/dL — AB (ref 65–99)
POTASSIUM: 4.2 mmol/L (ref 3.5–5.1)
SODIUM: 135 mmol/L (ref 135–145)

## 2014-10-08 NOTE — Progress Notes (Signed)
Patient ID: Linda Richard, female   DOB: 12/15/1950, 64 y.o.   MRN: [id number]     [facility]      [address]   [place], [place] [address]    Phone: [phone] FAX: [phone]     Subjective: Less pain. No n/v.  Passing flatus.  No BM.  VSS, bp soft, but no symptoms.  Tolerating sips.    Objective:  Vital signs:  Filed Vitals:   10/07/14 2135 10/08/14 0153 10/08/14 0522 10/08/14 0914  BP: 100/50 101/39 99/43 183/98  Pulse: 88 78  75  Temp: 98 F (36.7 C) 99.4 F (37.4 C) 99.8 F (37.7 C) 97.9 F (36.6 C)  TempSrc: Oral Oral Oral Oral  Resp: 20 18 18 20  Height:      Weight:      SpO2: 95% 96% 97% 100%    Last BM Date: 10/05/14  Intake/Output   Yesterday:    This shift:     Physical Exam: General: Pt awake/alert/oriented x4 in no acute distress Abdomen: Soft.  Nondistended.  ttp pelvic region.  No evidence of peritonitis.  No incarcerated hernias.    Problem List:   Active Problems:   Diverticulitis of intestine with perforation    Results:   Labs: Results for orders placed or performed during the hospital encounter of 10/06/14 (from the past 48 hour(s))  CBC     Status: Abnormal   Collection Time: 10/07/14  5:44 AM  Result Value Ref Range   WBC 14.1 (H) 4.0 - 10.5 K/uL   RBC 4.72 3.87 - 5.11 MIL/uL   Hemoglobin 14.1 12.0 - 15.0 g/dL   HCT 42.6 36.0 - 46.0 %   MCV 90.3 78.0 - 100.0 fL   MCH 29.9 26.0 - 34.0 pg   MCHC 33.1 30.0 - 36.0 g/dL   RDW 13.0 11.5 - 15.5 %   Platelets 156 150 - 400 K/uL  Comprehensive metabolic panel     Status: Abnormal   Collection Time: 10/07/14  5:44 AM  Result Value Ref Range   Sodium 135 135 - 145 mmol/L   Potassium 3.5 3.5 - 5.1 mmol/L   Chloride 100 (L) 101 - 111 mmol/L   CO2 27 22 - 32 mmol/L   Glucose, Bld 105 (H) 65 - 99 mg/dL   BUN 7 6 - 20 mg/dL   Creatinine, Ser 0.56 0.44 - 1.00 mg/dL   Calcium 9.2 8.9 - 10.3 mg/dL   Total Protein 6.2 (L) 6.5 - 8.1  g/dL   Albumin 3.0 (L) 3.5 - 5.0 g/dL   AST 17 15 - 41 U/L   ALT 17 14 - 54 U/L   Alkaline Phosphatase 57 38 - 126 U/L   Total Bilirubin 1.3 (H) 0.3 - 1.2 mg/dL   GFR calc non Af Amer >60 >60 mL/min   GFR calc Af Amer >60 >60 mL/min    Comment: (NOTE) The eGFR has been calculated using the CKD EPI equation. This calculation has not been validated in all clinical situations. eGFR's persistently <60 mL/min signify possible Chronic Kidney Disease.    Anion gap 8 5 - 15  CBC     Status: None   Collection Time: 10/08/14  4:43 AM  Result Value Ref Range   WBC 9.2 4.0 - 10.5 K/uL   RBC 4.43 3.87 - 5.11 MIL/uL   Hemoglobin 13.4 12.0 - 15.0 g/dL   HCT 39.9 36.0 - 46.0 %   MCV 90.1 78.0 -   100.0 fL   MCH 30.2 26.0 - 34.0 pg   MCHC 33.6 30.0 - 36.0 g/dL   RDW 12.8 11.5 - 15.5 %   Platelets 158 150 - 400 K/uL  Basic metabolic panel     Status: Abnormal   Collection Time: 10/08/14  4:43 AM  Result Value Ref Range   Sodium 135 135 - 145 mmol/L   Potassium 4.2 3.5 - 5.1 mmol/L   Chloride 99 (L) 101 - 111 mmol/L   CO2 28 22 - 32 mmol/L   Glucose, Bld 110 (H) 65 - 99 mg/dL   BUN <5 (L) 6 - 20 mg/dL    Comment: REPEATED TO VERIFY   Creatinine, Ser 0.44 0.44 - 1.00 mg/dL   Calcium 9.0 8.9 - 10.3 mg/dL   GFR calc non Af Amer >60 >60 mL/min   GFR calc Af Amer >60 >60 mL/min    Comment: (NOTE) The eGFR has been calculated using the CKD EPI equation. This calculation has not been validated in all clinical situations. eGFR's persistently <60 mL/min signify possible Chronic Kidney Disease.    Anion gap 8 5 - 15    Imaging / Studies: No results found.  Medications / Allergies:  Scheduled Meds: . enoxaparin (LOVENOX) injection  40 mg Subcutaneous Daily  . piperacillin-tazobactam (ZOSYN)  IV  3.375 g Intravenous 3 times per day   Continuous Infusions: . dextrose 5% lactated ringers 125 mL/hr at 10/07/14 1957   PRN Meds:.HYDROmorphone (DILAUDID) injection,  ondansetron  Antibiotics: Anti-infectives    Start     Dose/Rate Route Frequency Ordered Stop   10/06/14 0600  piperacillin-tazobactam (ZOSYN) IVPB 3.375 g     3.375 g 12.5 mL/hr over 240 Minutes Intravenous 3 times per day 10/06/14 0439     10/06/14 0315  piperacillin-tazobactam (ZOSYN) IVPB 3.375 g     3.375 g 100 mL/hr over 30 Minutes Intravenous  Once 10/06/14 0305 10/06/14 0359        Assessment/Plan HD#2 Sigmoid diverticulitis with small perforation  -tolerating sips, abd pain improved, WBC normal, afebrile -continue non operative management -colonoscopy 5/13-severe diverticulitis(Dr. Kaplan) ID-zosyn D#2/14 FEN-allow for clears Dispo-continue inpatient for IV atbx  Emina Riebock, ANP-BC [facility]   10/08/2014 9:21 AM    

## 2014-10-09 LAB — CBC
HCT: 38.6 % (ref 36.0–46.0)
Hemoglobin: 13 g/dL (ref 12.0–15.0)
MCH: 30 pg (ref 26.0–34.0)
MCHC: 33.7 g/dL (ref 30.0–36.0)
MCV: 88.9 fL (ref 78.0–100.0)
Platelets: 179 10*3/uL (ref 150–400)
RBC: 4.34 MIL/uL (ref 3.87–5.11)
RDW: 12.4 % (ref 11.5–15.5)
WBC: 6.7 10*3/uL (ref 4.0–10.5)

## 2014-10-09 MED ORDER — AMOXICILLIN-POT CLAVULANATE 875-125 MG PO TABS
1.0000 | ORAL_TABLET | Freq: Two times a day (BID) | ORAL | Status: DC
Start: 2014-10-09 — End: 2014-10-10
  Administered 2014-10-09 – 2014-10-10 (×3): 1 via ORAL
  Filled 2014-10-09 (×3): qty 1

## 2014-10-09 MED ORDER — TRAMADOL HCL 50 MG PO TABS
50.0000 mg | ORAL_TABLET | Freq: Four times a day (QID) | ORAL | Status: DC | PRN
  Administered 2014-10-09 – 2014-10-10 (×4): 50 mg via ORAL
  Filled 2014-10-09 (×4): qty 1

## 2014-10-09 NOTE — Progress Notes (Signed)
  Subjective: Passing flatus, small stools, tol clears  Objective: Vital signs in last 24 hours: Temp:  [97.7 F (36.5 C)-98.3 F (36.8 C)] 98.2 F (36.8 C) (05/22 0622) Pulse Rate:  [71-90] 73 (05/22 0622) Resp:  [16-20] 18 (05/22 0622) BP: (95-183)/(31-98) 105/48 mmHg (05/22 0622) SpO2:  [97 %-100 %] 97 % (05/22 0622) Last BM Date: 10/08/14  Intake/Output from previous day:   Intake/Output this shift:    GI: soft minimal llq tenderness  Lab Results:   Recent Labs  10/08/14 0443 10/09/14 0344  WBC 9.2 6.7  HGB 13.4 13.0  HCT 39.9 38.6  PLT 158 179   BMET  Recent Labs  10/07/14 0544 10/08/14 0443  NA 135 135  K 3.5 4.2  CL 100* 99*  CO2 27 28  GLUCOSE 105* 110*  BUN 7 <5*  CREATININE 0.56 0.44  CALCIUM 9.2 9.0   PT/INR No results for input(s): LABPROT, INR in the last 72 hours. ABG No results for input(s): PHART, HCO3 in the last 72 hours.  Invalid input(s): PCO2, PO2  Studies/Results: No results found.  Anti-infectives: Anti-infectives    Start     Dose/Rate Route Frequency Ordered Stop   10/09/14 1000  amoxicillin-clavulanate (AUGMENTIN) 875-125 MG per tablet 1 tablet     1 tablet Oral Every 12 hours 10/09/14 0750     10/06/14 0600  piperacillin-tazobactam (ZOSYN) IVPB 3.375 g  Status:  Discontinued     3.375 g 12.5 mL/hr over 240 Minutes Intravenous 3 times per day 10/06/14 0439 10/09/14 0750   10/06/14 0315  piperacillin-tazobactam (ZOSYN) IVPB 3.375 g     3.375 g 100 mL/hr over 30 Minutes Intravenous  Once 10/06/14 0305 10/06/14 0359      Assessment/Plan: HD 3 sigmoid diverticulitis 1. Will add tramadol instead of narcotic, not really having much pain 2. Advance diet 3. Switch to augmentin 3/7 4. Lovenox, scds 5. Home tomorrow if does well  [facility]KE[facility],Shanetta Nicolls 10/09/2014

## 2014-10-10 ENCOUNTER — Other Ambulatory Visit: Payer: 59 | Admitting: Vascular Surgery

## 2014-10-10 MED ORDER — TRAMADOL HCL 50 MG PO TABS: 50.0000 mg | ORAL_TABLET | Freq: Four times a day (QID) | ORAL | Status: DC | PRN

## 2014-10-10 MED ORDER — AMOXICILLIN-POT CLAVULANATE 875-125 MG PO TABS: 1.0000 | ORAL_TABLET | Freq: Two times a day (BID) | ORAL | Status: DC

## 2014-10-10 NOTE — Progress Notes (Signed)
Discharged home, self care.  

## 2014-10-10 NOTE — Discharge Instructions (Signed)
Please be sure to complete your antibiotics You may take pain meds such as ibuprofen or tylenol if needed. If your pain worsens, please call [facility] [place] [facility] or return to the emergency department  Diverticulitis Diverticulitis is inflammation or infection of small pouches in your colon that form when you have a condition called diverticulosis. The pouches in your colon are called diverticula. Your colon, or large intestine, is where water is absorbed and stool is formed. Complications of diverticulitis can include:  Bleeding.  Severe infection.  Severe pain.  Perforation of your colon.  Obstruction of your colon. CAUSES  Diverticulitis is caused by bacteria. Diverticulitis happens when stool becomes trapped in diverticula. This allows bacteria to grow in the diverticula, which can lead to inflammation and infection. RISK FACTORS People with diverticulosis are at risk for diverticulitis. Eating a diet that does not include enough fiber from fruits and vegetables may make diverticulitis more likely to develop. SYMPTOMS  Symptoms of diverticulitis may include:  Abdominal pain and tenderness. The pain is normally located on the left side of the abdomen, but may occur in other areas.  Fever and chills.  Bloating.  Cramping.  Nausea.  Vomiting.  Constipation.  Diarrhea.  Blood in your stool. DIAGNOSIS  Your health care provider will ask you about your medical history and do a physical exam. You may need to have tests done because many medical conditions can cause the same symptoms as diverticulitis. Tests may include:  Blood tests.  Urine tests.  Imaging tests of the abdomen, including X-rays and CT scans. When your condition is under control, your health care provider may recommend that you have a colonoscopy. A colonoscopy can show how severe your diverticula are and whether something else is causing your symptoms. TREATMENT  Most cases of diverticulitis are  mild and can be treated at home. Treatment may include:  Taking over-the-counter pain medicines.  Following a clear liquid diet.  Taking antibiotic medicines by mouth for 7-10 days. More severe cases may be treated at a hospital. Treatment may include:  Not eating or drinking.  Taking prescription pain medicine.  Receiving antibiotic medicines through an IV tube.  Receiving fluids and nutrition through an IV tube.  Surgery. HOME CARE INSTRUCTIONS   Follow your health care provider's instructions carefully.  Follow a full liquid diet or other diet as directed by your health care provider. After your symptoms improve, your health care provider may tell you to change your diet. He or she may recommend you eat a high-fiber diet. Fruits and vegetables are good sources of fiber. Fiber makes it easier to pass stool.  Take fiber supplements or probiotics as directed by your health care provider.  Only take medicines as directed by your health care provider.  Keep all your follow-up appointments. SEEK MEDICAL CARE IF:   Your pain does not improve.  You have a hard time eating food.  Your bowel movements do not return to normal. SEEK IMMEDIATE MEDICAL CARE IF:   Your pain becomes worse.  Your symptoms do not get better.  Your symptoms suddenly get worse.  You have a fever.  You have repeated vomiting.  You have bloody or black, tarry stools. MAKE SURE YOU:   Understand these instructions.  Will watch your condition.  Will get help right away if you are not doing well or get worse. Document Released: 02/13/2005 Document Revised: 05/11/2013 Document Reviewed: 03/31/2013 [facility] Patient Information 2015 [employer], [place]. This information is not intended to replace advice  given to you by your health care provider. Make sure you discuss any questions you have with your health care provider.

## 2014-10-10 NOTE — Discharge Summary (Signed)
Physician Discharge Summary  Linda Richard:[id number] DOB: March 12, 1951 DOA: 10/06/2014  PCP: Lorretta Harp, MD  Consultation: none  Admit date: 10/06/2014 Discharge date: 10/10/2014  Recommendations for Outpatient Follow-up:   Follow-up Information    Follow up with Linda Loron, MD On 10/27/2014.   Specialty:  General Surgery   Why:  arrive by 11:45AM for 12PM follow up for your diverticulitis with the surgeon   Contact information:   [address] [place] [address] [phone]      Discharge Diagnoses:  1. Diverticulitis with microperforation    Surgical Procedure: none  Discharge Condition: stable Disposition: home  Diet recommendation: low residue   [facility] Weights   10/05/14 2345 10/06/14 0514  Weight: 56.246 kg (124 lb) 57.4 kg (126 lb 8.7 oz)       Hospital Course:  Linda Richard is a 64 year old female who presented to [facility] 5 days after her colonoscopy and a barium enema which diverticulitis.  A CT of abdomen and pelvis showed scattered intra-abdominal free air.  She did not have peritonitis and was therefore admitted for IV antibiotics and bowel rest.  She quickly improved.  WBC normalized.  She remained afebrile.  On HD#3 zosyn was changed to Augmentin, diet was advanced to soft.  On HD#4 the patient was tolerating a diet, having BMs, had no pain and afebrile.  She was therefore felt stable for discharge home with 3 days of augmentin and tramadol PRN.  Medication risks, benefits and therapeutic alternatives were reviewed with the patient.  She verbalizes understanding. A follow up has been scheduled and the patient is aware.  She knows to call with questions, concerns or worsening symptoms.    Physical Exam: General: NAD, calm and cooperative Lungs: cta Abd: +Bs, non tender  Discharge Instructions     Medication List    TAKE these medications        acetaminophen 650 MG CR tablet  Commonly known as:  TYLENOL  Take 650 mg  by mouth every 8 (eight) hours as needed for pain.     amoxicillin-clavulanate 875-125 MG per tablet  Commonly known as:  AUGMENTIN  Take 1 tablet by mouth every 12 (twelve) hours.     aspirin 81 MG tablet  Take 81 mg by mouth daily.     calcium-vitamin D 500-200 MG-UNIT per tablet  Commonly known as:  OSCAL WITH D  Take 1 tablet by mouth daily.     cholecalciferol 1000 UNITS tablet  Commonly known as:  VITAMIN D  Take 1,000 Units by mouth daily.     ibuprofen 200 MG tablet  Commonly known as:  ADVIL,MOTRIN  Take 200 mg by mouth every 6 (six) hours as needed.     ondansetron 4 MG tablet  Commonly known as:  ZOFRAN  Take 1 tablet (4 mg total) by mouth every 8 (eight) hours as needed for nausea or vomiting.     ONE DAILY WOMENS 50+ PO  Take 1 tablet by mouth daily.     PROBIOTIC DAILY PO  Take by mouth.     traMADol 50 MG tablet  Commonly known as:  ULTRAM  Take 1 tablet (50 mg total) by mouth every 6 (six) hours as needed for moderate pain.     vitamin C 500 MG tablet  Commonly known as:  ASCORBIC ACID  Take 500 mg by mouth daily.           Follow-up Information    Follow up  with Linda LoronWAKEFIELD,MATTHEW, MD On 10/27/2014.   Specialty:  General Surgery   Why:  arrive by 11:45AM for 12PM follow up for your diverticulitis with the surgeon   Contact information:   [address] [place] [address] [phone]        The results of significant diagnostics from this hospitalization (including imaging, microbiology, ancillary and laboratory) are listed below for reference.    Significant Diagnostic Studies: Ct Abdomen Pelvis W Contrast  10/06/2014   CLINICAL DATA:  Acute onset of generalized abdominal pain, with nausea. Initial encounter.  EXAM: CT ABDOMEN AND PELVIS WITH CONTRAST  TECHNIQUE: Multidetector CT imaging of the abdomen and pelvis was performed using the standard protocol following bolus administration of intravenous contrast.  CONTRAST:  100mL  OMNIPAQUE IOHEXOL 300 MG/ML  SOLN  COMPARISON:  CT of the abdomen and pelvis from 05/02/2010  FINDINGS: The visualized lung bases are clear.  Scattered free intra-abdominal air is noted throughout the upper abdomen, tracking about small bowel loops. There appears to be a 3.0 x 2.7 x 1.5 cm collection of fluid and air at the upper hemipelvis, with mild surrounding soft tissue edema. This may reflect the site of perforation, along the mid to distal sigmoid colon. Trace fluid is noted within the pelvis.  There is mild prominence of the intrahepatic biliary ducts. A stone is noted within the gallbladder. The gallbladder is otherwise unremarkable. The pancreas and adrenal glands are unremarkable.  Mild bilateral renal pelvicaliectasis remains within normal limits, without definite hydronephrosis. A 3 mm nonobstructing stone is noted at the upper pole of the left kidney. The kidneys are otherwise unremarkable. No obstructing ureteral stones are seen. No perinephric stranding is appreciated.  No free fluid is identified. The small bowel is unremarkable in appearance. The stomach is within normal limits. No acute vascular abnormalities are seen. Prominent collateral vessels are noted along the anterior abdominal wall.  The appendix is not definitely characterized; there is no evidence of appendicitis. Contrast is noted within the colon, with residual barium noted in diverticula along the sigmoid colon.  The bladder is mildly distended and grossly unremarkable.  There is a nodular appearance to the cervix, though this may simply reflect surrounding vasculature. There is diffuse prominence of the periuterine vasculature, of uncertain significance. Would correlate with Pap smear, to exclude malignancy. This could reflect pelvic congestion syndrome in the appropriate clinical situation. No inguinal lymphadenopathy is seen.  No acute osseous abnormalities are identified. Multilevel vacuum phenomenon is noted along the lumbar  spine. There is slight chronic loss of height at vertebral body T12.  IMPRESSION: 1. Bowel perforation, with scattered free intra-abdominal air throughout the upper abdomen, tracking about small bowel loops. This appears to reflect a perforation at the mid to distal sigmoid colon, given a 3.0 x 2.7 x 1.5 cm collection of fluid and air at the upper hemipelvis adjacent to the sigmoid colon, with mild surrounding soft tissue edema. 2. Nodular appearance to the cervix, though this may simply reflect surrounding vasculature. Diffuse prominence of the periuterine vasculature, of uncertain significance. Would correlate with Pap smear, to exclude malignancy. This could reflect pelvic congestion syndrome in the appropriate clinical situation. 3. Mild prominence of the intrahepatic biliary ducts. Stone noted within the gallbladder. Gallbladder otherwise unremarkable in appearance. 4. 3 mm nonobstructing stone at the upper pole of the left kidney. 5. Prominent collateral vessels noted along the anterior abdominal wall.  Critical Value/emergent results were called by telephone at the time of interpretation  on 10/06/2014 at 2:40 am to [facility], who verbally acknowledged these results.   Electronically Signed   By: Roanna Raider M.D.   On: 10/06/2014 02:49   Dg Colon Ramond Craver Hi Den Cm  09/30/2014   CLINICAL DATA:  Incomplete colonoscopy.  Diverticulosis.  EXAM: AIR CONTRAST BARIUM ENEMA  TECHNIQUE: Initial scout AP supine abdominal image obtained to insure adequate colon cleansing. Barium was introduced into the colon in a retrograde fashion and refluxed from the rectum to the distal transverse colon. As much of the barium as possible was then removed through the indwelling tube via gravity drain. Air was then insufflated into the colon. Spot images of the colon followed by overhead radiographs were obtained.  FLUOROSCOPY TIME:  Fluoroscopy Time:  4 minutes 49 seconds  Number of Acquired Images:  24  COMPARISON:   None.  FINDINGS: The scout KUB demonstrates a normal bowel gas pattern. There is a moderate lumbar scoliosis. There is a calcified stone in the gallbladder.  There are multiple diverticula in the redundant sigmoid portion of the colon. There is small amount of particulate fecal material in the colon. There are no polyps or mass lesions. There was no reflux into the terminal ileum. The appendix did partially fill with contrast.  IMPRESSION: Sigmoid diverticulosis. Otherwise, normal air-contrast barium enema. Incidental note of cholelithiasis, unchanged since CT scan of 05/02/2010.   Electronically Signed   By: Francene Boyers M.D.   On: 09/30/2014 16:21    Microbiology: No results found for this or any previous visit (from the past 240 hour(s)).   Labs: Basic Metabolic Panel:  Recent Labs Lab 10/05/14 2354 10/07/14 0544 10/08/14 0443  NA 136 135 135  K 4.1 3.5 4.2  CL 99* 100* 99*  CO2 GLUCOSE 148* 105* 110*  BUN 10 7 <5*  CREATININE 0.62 0.56 0.44  CALCIUM 9.6 9.2 9.0   Liver Function Tests:  Recent Labs Lab 10/05/14 2354 10/07/14 0544  AST 23 17  ALT 23 17  ALKPHOS 54 57  BILITOT 1.4* 1.3*  PROT 7.3 6.2*  ALBUMIN 4.0 3.0*    Recent Labs Lab 10/05/14 2354  LIPASE 43   No results for input(s): AMMONIA in the last 168 hours. CBC:  Recent Labs Lab 10/05/14 2354 10/07/14 0544 10/08/14 0443 10/09/14 0344  WBC 14.2* 14.1* 9.2 6.7  NEUTROABS 12.7*  --   --   --   HGB 15.1* 14.1 13.4 13.0  HCT 43.9 42.6 39.9 38.6  MCV 88.7 90.3 90.1 88.9  PLT 189 156 158 179   Cardiac Enzymes: No results for input(s): CKTOTAL, CKMB, CKMBINDEX, TROPONINI in the last 168 hours. BNP: BNP (last 3 results) No results for input(s): BNP in the last 8760 hours.  ProBNP (last 3 results) No results for input(s): PROBNP in the last 8760 hours.  CBG: No results for input(s): GLUCAP in the last 168 hours.  Active Problems:   Diverticulitis of intestine with  perforation   Time coordinating discharge: <30 mins  Signed:  Aidan Caloca, ANP-BC

## 2014-10-11 ENCOUNTER — Telehealth: Payer: Self-pay | Admitting: *Deleted

## 2014-10-11 ENCOUNTER — Encounter: Payer: Self-pay | Admitting: Family Medicine

## 2014-10-11 ENCOUNTER — Ambulatory Visit (INDEPENDENT_AMBULATORY_CARE_PROVIDER_SITE_OTHER): Payer: 59 | Admitting: Family Medicine

## 2014-10-11 ENCOUNTER — Telehealth: Payer: Self-pay | Admitting: Internal Medicine

## 2014-10-11 VITALS — BP 138/82 | HR 79 | Temp 98.9°F | Ht 69.0 in | Wt 122.0 lb

## 2014-10-11 DIAGNOSIS — R350 Frequency of micturition: Secondary | ICD-10-CM | POA: Diagnosis not present

## 2014-10-11 DIAGNOSIS — N390 Urinary tract infection, site not specified: Secondary | ICD-10-CM | POA: Diagnosis not present

## 2014-10-11 LAB — POCT URINALYSIS DIPSTICK
Bilirubin, UA: NEGATIVE
GLUCOSE UA: NEGATIVE
KETONES UA: NEGATIVE
LEUKOCYTES UA: NEGATIVE
Nitrite, UA: NEGATIVE
Protein, UA: NEGATIVE
SPEC GRAV UA: 1.015
UROBILINOGEN UA: 0.2
pH, UA: 6

## 2014-10-11 MED ORDER — CIPROFLOXACIN HCL 500 MG PO TABS: 500.0000 mg | ORAL_TABLET | Freq: Two times a day (BID) | ORAL | Status: DC

## 2014-10-11 NOTE — Telephone Encounter (Signed)
Patient Name: Linda PattersonMARTHA Melecio DOB: 11/23/1950 Initial Comment Caller states, diverticulitis and UTI since Thurs AM, taking antibiotics in the hospital, She started taking antibiotics by mouth on Sunday, she still has frequency symptoms Nurse Assessment Nurse: Elijah Birkaldwell, RN, [name] Date/Time (Eastern Time): 10/11/2014 9:32:23 AM Confirm and document reason for call. If symptomatic, describe symptoms. ---Caller states she has diverticulosis and UTI symptoms since Thursday am. In hospital from Thursday - yesterday, taking antibiotics in the hospital, Had a barium enema on the Friday before following colonoscopy. She started taking antibiotics - Amoxy/Clav by mouth on Sunday. She still has frequency symptoms & urgency. No blood in urine. Had fever in hospital. Told she has a kidney stone in left kidney. Has the patient traveled out of the country within the last 30 days? ---Not Applicable Does the patient require triage? ---Yes Related visit to physician within the last 2 weeks? ---Yes Does the PT have any chronic conditions? (i.e. diabetes, asthma, etc.) ---Yes List chronic conditions. ---diverticulosis Guidelines Guideline Title Affirmed Question Affirmed Notes Urinary Tract Infection on Antibiotic Follow-up Call - Female [1] Taking antibiotic > 72 hours (3 days) for UTI AND [2] painful urination or frequency not improved Final Disposition User See Physician within 24 Hours Sherwood Manoraldwell, RN, Hilton HotelsLynda Comments Caller voiced some frustrations with the way her care has been handled, like there hasn't been collaboration between her doctors & what she has been dealing with. Scheduled an appt. for patient with a provider at the office today to address her continued UTI symptoms despite being on antibiotics.

## 2014-10-11 NOTE — Telephone Encounter (Signed)
Called and spoke to patient about frustration. Patient states she wishes the antibiotics she received in the hospital helped to resolve her UTI symptoms but "they did not." Frustration revolves around her hospital visit. Asked if there is anything we can do for her prior to her office visit and she stated no, thankful for the call. Will see patient today at 3:30pm.

## 2014-10-11 NOTE — Telephone Encounter (Signed)
Noted  

## 2014-10-11 NOTE — Telephone Encounter (Signed)
error 

## 2014-10-11 NOTE — Progress Notes (Signed)
Pre visit review using our clinic review tool, if applicable. No additional management support is needed unless otherwise documented below in the visit note. 

## 2014-10-12 ENCOUNTER — Encounter: Payer: Self-pay | Admitting: Family Medicine

## 2014-10-12 NOTE — Progress Notes (Signed)
   Subjective:    Patient ID: Linda Richard, female    DOB: 04/26/1951, 64 y.o.   MRN: [id number]  HPI Here to follow up on a hospital stay from 10-06-14 to 10-10-14 for peritonitis from a perforated bowel and a UTI. Several days before her admission he had a colonoscopy and a barium enema, and she developed pain and swelling in the abdomen. Imaging revealed some free air in the abdomen. Surgery was consulted and they felt she did not require surgical intervention. She was also found to have an apparent UTI, though no culture was ordered. She was treated with IV Zosyn and then was switched to oral Augmentin. She did well and now that she is at home, her bowels are moving normally with no nausea or pain or fever. She has 3 days of Augmentin left to take. Yesterday she developed some urinary burning and urgency.    Review of Systems  Constitutional: Negative.   Gastrointestinal: Negative.   Genitourinary: Positive for dysuria, urgency and frequency. Negative for flank pain and pelvic pain.       Objective:   Physical Exam  Constitutional: She appears well-developed and well-nourished. No distress.  Cardiovascular: Normal rate, regular rhythm, normal heart sounds and intact distal pulses.   Pulmonary/Chest: Effort normal and breath sounds normal.  Abdominal: Soft. Bowel sounds are normal. She exhibits no distension and no mass. There is no tenderness. There is no rebound and no guarding.          Assessment & Plan:  Partially treated UTI. Her bowel perforation has resolved. She will finish out the Augmentin but we will add 7 days of Cipro 500 mg bid to cover the UTI. We will culture the sample.

## 2014-10-18 ENCOUNTER — Ambulatory Visit (INDEPENDENT_AMBULATORY_CARE_PROVIDER_SITE_OTHER): Payer: 59 | Admitting: Internal Medicine

## 2014-10-18 ENCOUNTER — Telehealth: Payer: Self-pay | Admitting: Internal Medicine

## 2014-10-18 ENCOUNTER — Encounter: Payer: Self-pay | Admitting: Internal Medicine

## 2014-10-18 VITALS — BP 116/90 | Temp 98.6°F | Ht 69.0 in | Wt 121.8 lb

## 2014-10-18 DIAGNOSIS — K5700 Diverticulitis of small intestine with perforation and abscess without bleeding: Secondary | ICD-10-CM | POA: Diagnosis not present

## 2014-10-18 DIAGNOSIS — R109 Unspecified abdominal pain: Secondary | ICD-10-CM

## 2014-10-18 DIAGNOSIS — N200 Calculus of kidney: Secondary | ICD-10-CM | POA: Diagnosis not present

## 2014-10-18 DIAGNOSIS — R938 Abnormal findings on diagnostic imaging of other specified body structures: Secondary | ICD-10-CM

## 2014-10-18 DIAGNOSIS — Z09 Encounter for follow-up examination after completed treatment for conditions other than malignant neoplasm: Secondary | ICD-10-CM | POA: Diagnosis not present

## 2014-10-18 DIAGNOSIS — R9389 Abnormal findings on diagnostic imaging of other specified body structures: Secondary | ICD-10-CM

## 2014-10-18 DIAGNOSIS — R350 Frequency of micturition: Secondary | ICD-10-CM

## 2014-10-18 DIAGNOSIS — K8020 Calculus of gallbladder without cholecystitis without obstruction: Secondary | ICD-10-CM

## 2014-10-18 LAB — POCT URINALYSIS DIPSTICK
Bilirubin, UA: NEGATIVE
Blood, UA: 1
GLUCOSE UA: NEGATIVE
Leukocytes, UA: NEGATIVE
Nitrite, UA: NEGATIVE
PROTEIN UA: 1
Urobilinogen, UA: 0.2
pH, UA: 5.5

## 2014-10-18 NOTE — Telephone Encounter (Signed)
Opened in erro/pt made appt °

## 2014-10-18 NOTE — Patient Instructions (Signed)
Sending urine culture but  Urine not consistent with UTI consideration of kidney stone acting up and poss bladder dysfunction such as incomplete emptying etc.  Will notify you  of  Culture when available. And  treat accordingly . Plan urology referral Will send  Info to Dr Billy Coastaavon and [name] about the ct scan image of the uterus.  So they can address if this is important or not.   Can try otc azo for bladder pain to see if makes any difference in sx     Appears safe to do .  Advance diet as tolerated and  Further advice from the surgeons office .    call with fever  Other concerns  In the interim

## 2014-10-18 NOTE — Progress Notes (Signed)
Pre visit review using our clinic review tool, if applicable. No additional management support is needed unless otherwise documented below in the visit note.  Chief Complaint  Patient presents with  . ED Follow Up    HPI: Patient Linda Richard  comes in today for SDA for  new problem evaluation. Sp hosp for perforation diverticula after colon attempa dn be  And rx for uti for urinary disc frequency last week. She took a Cipro for 3 days and didn't feel well so she stopped it and it didn't make a difference in her frequency. She had a history of frequency in the remote past but never like this and had a urologic evaluation it improved. This time she's had some low back pain and soreness on the left with radiation into the abdomen. No gross hematuria. She had decreased bowel movements with her hospitalization but now had a normal one today. Appetite is probably back to normal but she feels very tired and weak.    Low back pain   Came out some pain  No fever but clammy   Left lower abd pain . Quite a lot of   Fluids  In day  Dec  Fluids at night . Last cipro  For 3 days and no help . And felt bad .   Heart racing .  Still has to go to go frequently .   No dysuria  reports significant volume of passing urine without urgency otherwise finished antibiotic .Marland Kitchen. ROS: See pertinent positives and negatives per HPI. No chest pain shortness of breath very fatigued. Has GYN appointment with Linda Richard on June 14. Remote history of seeing Linda Richard for her urologic issues in the past  Past Medical History  Diagnosis Date  . DVT (deep vein thrombosis) in pregnancy   . Varicose veins     strip and laser  . History of renal stone     2011  . Normal echocardiogram 2011    mild lvh ? aortic sclerosis no as    . Chronic kidney disease 2012    kidney stone    Family History  Problem Relation Age of Onset  . Hypertension    . Kidney disease      renal stones  . Arthritis    . Cancer Father    bladder   4650s   . Ovarian cancer Mother     2870s  . Other      son age 62 irreg HB  had cardioversion   . Thyroid disease    . Colon polyps Neg Hx   . Colon cancer Neg Hx   . Rectal cancer Neg Hx   . Stomach cancer Neg Hx     History   Social History  . Marital Status: Married    Spouse Name: N/A  . Number of Children: N/A  . Years of Education: N/A   Social History Main Topics  . Smoking status: Never Smoker   . Smokeless tobacco: Never Used  . Alcohol Use: 0.0 oz/week    0 Standard drinks or equivalent per week     Comment: 3 x a week, mostly wine no t daily]  . Drug Use: No  . Sexual Activity: Not on file   Other Topics Concern  . None   Social History Narrative   HH of 2    Grandchild  Sons    reitred [employer] Child Support   Married.    No  ets tobacco No  Exercise restriction   etoh 2 x per week.    Exercise good.       G3P2    Outpatient Prescriptions Prior to Visit  Medication Sig Dispense Refill  . acetaminophen (TYLENOL) 650 MG CR tablet Take 650 mg by mouth every 8 (eight) hours as needed for pain.    Marland Kitchen aspirin 81 MG tablet Take 81 mg by mouth daily.      . calcium-vitamin D (OSCAL WITH D) 500-200 MG-UNIT per tablet Take 1 tablet by mouth daily.      . cholecalciferol (VITAMIN D) 1000 UNITS tablet Take 1,000 Units by mouth daily.      Marland Kitchen ibuprofen (ADVIL,MOTRIN) 200 MG tablet Take 200 mg by mouth every 6 (six) hours as needed.    . Multiple Vitamins-Minerals (ONE DAILY WOMENS 50+ PO) Take 1 tablet by mouth daily.    . Probiotic Product (PROBIOTIC DAILY PO) Take by mouth.    . vitamin C (ASCORBIC ACID) 500 MG tablet Take 500 mg by mouth daily.    . ondansetron (ZOFRAN) 4 MG tablet Take 1 tablet (4 mg total) by mouth every 8 (eight) hours as needed for nausea or vomiting. (Patient not taking: Reported on 10/11/2014) 15 tablet 0  . traMADol (ULTRAM) 50 MG tablet Take 1 tablet (50 mg total) by mouth every 6 (six) hours as needed for moderate pain. (Patient not  taking: Reported on 10/11/2014) 30 tablet 0  . amoxicillin-clavulanate (AUGMENTIN) 875-125 MG per tablet Take 1 tablet by mouth every 12 (twelve) hours. 6 tablet 0  . ciprofloxacin (CIPRO) 500 MG tablet Take 1 tablet (500 mg total) by mouth 2 (two) times daily. (Patient not taking: Reported on 10/18/2014) 14 tablet 0   No facility-administered medications prior to visit.     EXAM:  BP 116/90 mmHg  Temp(Src) 98.6 F (37 C) (Oral)  Ht  (1.753 m)  Wt 121 lb 12.8 oz (55.248 kg)  BMI 17.98 kg/m2  Body mass index is 17.98 kg/(m^2).  GENERAL: vitals reviewed and listed above, alert, oriented, appears well hydrated and in no acute distress looks tired nontoxic HEENT: atraumatic, conjunctiva  clear, no obvious abnormalities on inspection of external nose and ears NECK: no obvious masses on inspection palpation  LUNGS: clear to auscultation bilaterally, no wheezes, rales or rhonchi, good air movement CV: HRRR, no clubbing cyanosis or  peripheral edema nl cap refill  Abdomen soft without Megaly guarding or rebound bowel sounds are present in all 4 quadrants there some mild subjective tenderness to left lower quadrant I reports low back pain perhaps left flank pain radiating MS: moves all extremities without noticeable focal  abnormality PSYCH: pleasant and cooperative,  Lab Results  Component Value Date   WBC 6.7 10/09/2014   HGB 13.0 10/09/2014   HCT 38.6 10/09/2014   PLT 179 10/09/2014   GLUCOSE 110* 10/08/2014   CHOL 224* 03/01/2014   TRIG 39.0 03/01/2014   HDL 91.70 03/01/2014   LDLDIRECT 128.8 02/23/2013   LDLCALC 125* 03/01/2014   ALT 17 10/07/2014   AST 17 10/07/2014   NA 135 10/08/2014   K 4.2 10/08/2014   CL 99* 10/08/2014   CREATININE 0.44 10/08/2014   BUN <5* 10/08/2014   CO2 28 10/08/2014   TSH 1.63 03/01/2014   HGBA1C 5.3 02/26/2012   MICROALBUR 2.0* 01/24/2009    ASSESSMENT AND PLAN:  Discussed the following assessment and plan:  Urinary frequency - severe  since hosp dc no sleep nocturnal has 1+ blood today  no resonse to 3 d cipro culture sent today has lef stone NO in hosp.  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 - treated  conservatively antibiotic and rest gi sx better but sores left and urinary sx plan uro referral - Plan: Ambulatory referral to Urology  Renal calculus or stone - Plan: Ambulatory referral to Urology  Hospital discharge follow-up  Flank pain - Plan: Ambulatory referral to Urology  Abnormal finding on imaging - uterus cervix possibel info togyne and have them address / need for [place]   Gallstone Urine D/S last week was nl except for  trc blood  And culture is not in the system   continuing sx after  Empiric antibiotic use  Reviewed CT report with patient copy given see below explained    As much as possible.  -Patient advised to return or notify health care team  if symptoms worsen ,persist or new concerns arise. Total visit > 50% spent counseling and coordinating care as indicated in above note and in instructions to patient .    Patient Instructions  Sending urine culture but  Urine not consistent with UTI consideration of kidney stone acting up and poss bladder dysfunction such as incomplete emptying etc.  Will notify you  of  Culture when available. And  treat accordingly . Plan urology referral Will send  Info to Dr Billy Coast and Linda Dus about the ct scan image of the uterus.  So they can address if this is important or not.   Can try otc azo for bladder pain to see if makes any difference in sx     Appears safe to do .  Advance diet as tolerated and  Further advice from the surgeons office .    call with fever  Other concerns  In the interim      Neta Mends. Chosen Geske M.D.  COMPARISON: CT of the abdomen and pelvis from 05/02/2010  FINDINGS: The visualized lung bases are clear.  Scattered free  intra-abdominal air is noted throughout the upper abdomen, tracking about small bowel loops. There appears to be a 3.0 x 2.7 x 1.5 cm collection of fluid and air at the upper hemipelvis, with mild surrounding soft tissue edema. This may reflect the site of perforation, along the mid to distal sigmoid colon. Trace fluid is noted within the pelvis.  There is mild prominence of the intrahepatic biliary ducts. A stone is noted within the gallbladder. The gallbladder is otherwise unremarkable. The pancreas and adrenal glands are unremarkable.  Mild bilateral renal pelvicaliectasis remains within normal limits, without definite hydronephrosis. A 3 mm nonobstructing stone is noted at the upper pole of the left kidney. The kidneys are otherwise unremarkable. No obstructing ureteral stones are seen. No perinephric stranding is appreciated.  No free fluid is identified. The small bowel is unremarkable in appearance. The stomach is within normal limits. No acute vascular abnormalities are seen. Prominent collateral vessels are noted along the anterior abdominal wall.  The appendix is not definitely characterized; there is no evidence of appendicitis. Contrast is noted within the colon, with residual barium noted in diverticula along the sigmoid colon.  The bladder is mildly distended and grossly unremarkable.  There is a nodular appearance to the cervix, though this may simply reflect surrounding vasculature. There is diffuse prominence of the periuterine vasculature, of uncertain significance. Would correlate with Pap smear, to exclude malignancy. This could reflect pelvic congestion  syndrome in the appropriate clinical situation. No inguinal lymphadenopathy is seen.  No acute osseous abnormalities are identified. Multilevel vacuum phenomenon is noted along the lumbar spine. There is slight chronic loss of height at vertebral body T12.  IMPRESSION: 1. Bowel perforation, with  scattered free intra-abdominal air throughout the upper abdomen, tracking about small bowel loops. This appears to reflect a perforation at the mid to distal sigmoid colon, given a 3.0 x 2.7 x 1.5 cm collection of fluid and air at the upper hemipelvis adjacent to the sigmoid colon, with mild surrounding soft tissue edema. 2. Nodular appearance to the cervix, though this may simply reflect surrounding vasculature. Diffuse prominence of the periuterine vasculature, of uncertain significance. Would correlate with Pap smear, to exclude malignancy. This could reflect pelvic congestion syndrome in the appropriate clinical situation. 3. Mild prominence of the intrahepatic biliary ducts. Stone noted within the gallbladder. Gallbladder otherwise unremarkable in appearance. 4. 3 mm nonobstructing stone at the upper pole of the left kidney. 5. Prominent collateral vessels noted along the anterior abdominal wall.  Critical Value/emergent results were called by telephone at the time of interpretation on 10/06/2014 at 2:40 am to [facility] PA, who verbally acknowledged these results.   Electronically Signed  By: Roanna Raider M.D.  On: 10/06/2014 02:49

## 2014-10-19 LAB — URINE CULTURE
Colony Count: NO GROWTH
Organism ID, Bacteria: NO GROWTH

## 2014-10-21 ENCOUNTER — Encounter: Payer: Self-pay | Admitting: Vascular Surgery

## 2014-10-24 ENCOUNTER — Ambulatory Visit (INDEPENDENT_AMBULATORY_CARE_PROVIDER_SITE_OTHER): Payer: 59 | Admitting: Vascular Surgery

## 2014-10-24 ENCOUNTER — Encounter: Payer: Self-pay | Admitting: Vascular Surgery

## 2014-10-24 VITALS — BP 137/76 | HR 68 | Resp 14 | Ht 69.0 in | Wt 121.0 lb

## 2014-10-24 DIAGNOSIS — I83893 Varicose veins of bilateral lower extremities with other complications: Secondary | ICD-10-CM

## 2014-10-24 NOTE — Progress Notes (Signed)
Subjective:     Patient ID: Linda Richard, female   DOB: 08/10/1950, 64 y.o.   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 She tolerated the procedure well.   Review of Systems     Objective:   Physical Exam BP 137/76 mmHg  Pulse 68  Resp 14  Ht 5\' 9"  (1.753 m)  Wt 121 lb (54.885 kg)  BMI 17.86 kg/m2       Assessment:     Well-tolerated multiple stab phlebectomy of painful varicosities performed under local tumescent anesthesia    Plan:     Return in 3 months for final follow-up

## 2014-10-24 NOTE — Progress Notes (Signed)
    Stab Phlebectomy Procedure  Linda Richard DOB:12/15/1950  10/24/2014  Consent signed: Yes  Surgeon:J.D. Hart RochesterLawson  Procedure: stab phlebectomy: right leg  BP 137/76 mmHg  Pulse 68  Resp 14  Ht 5\' 9"  (1.753 m)  Wt 121 lb (54.885 kg)  BMI 17.86 kg/m2  Start time: 10   End time: 11   Tumescent Anesthesia: 180 cc 0.9% NaCl with 50 cc Lidocaine HCL with 1% Epi and 15 cc 8.4% NaHCO3  Local Anesthesia: 5 cc Lidocaine HCL and NaHCO3 (ratio 2:1)    Stab Phlebectomy: >20 Sites: Thigh and Calf  Patient tolerated procedure well: Yes  Notes:   Description of Procedure:  After marking the course of the secondary varicosities, the patient was placed on the operating table in the supine position, and the right leg was prepped and draped in sterile fashion.    The patient was then put into Trendelenburg position.  Local anesthetic was administered at the previously marked varicosities, and tumescent anesthesia was administered around the vessels.  Greater than 20 stab wounds were made using the tip of an 11 blade. And using the vein hook, the phlebectomies were performed using a hemostat to avulse the varicosities.  Adequate hemostasis was achieved, and steri strips were applied to the stab wound.      ABD pads and thigh high compression stockings were applied as well ace wraps where needed. Blood loss was less than 15 cc.  The patient ambulated out of the operating room having tolerated the procedure well.

## 2014-10-25 ENCOUNTER — Telehealth: Payer: Self-pay | Admitting: *Deleted

## 2014-10-25 NOTE — Telephone Encounter (Signed)
Pt doing well. Had no bleeding from her phlebectomy sites. Following all instructions. Will see her at her fu in Sept.

## 2014-10-26 ENCOUNTER — Encounter: Payer: Self-pay | Admitting: Vascular Surgery

## 2014-11-22 ENCOUNTER — Ambulatory Visit (INDEPENDENT_AMBULATORY_CARE_PROVIDER_SITE_OTHER): Payer: 59 | Admitting: Internal Medicine

## 2014-11-22 ENCOUNTER — Encounter: Payer: Self-pay | Admitting: Internal Medicine

## 2014-11-22 VITALS — BP 122/90 | Temp 98.5°F | Wt 119.4 lb

## 2014-11-22 DIAGNOSIS — R1013 Epigastric pain: Secondary | ICD-10-CM | POA: Diagnosis not present

## 2014-11-22 DIAGNOSIS — K8020 Calculus of gallbladder without cholecystitis without obstruction: Secondary | ICD-10-CM | POA: Diagnosis not present

## 2014-11-22 DIAGNOSIS — R109 Unspecified abdominal pain: Secondary | ICD-10-CM | POA: Diagnosis not present

## 2014-11-22 LAB — HEPATIC FUNCTION PANEL
ALT: 16 U/L (ref 0–35)
AST: 19 U/L (ref 0–37)
Albumin: 4.1 g/dL (ref 3.5–5.2)
Alkaline Phosphatase: 59 U/L (ref 39–117)
Bilirubin, Direct: 0.1 mg/dL (ref 0.0–0.3)
Total Bilirubin: 0.6 mg/dL (ref 0.2–1.2)
Total Protein: 7.6 g/dL (ref 6.0–8.3)

## 2014-11-22 LAB — CBC WITH DIFFERENTIAL/PLATELET
BASOS ABS: 0 10*3/uL (ref 0.0–0.1)
BASOS PCT: 0.4 % (ref 0.0–3.0)
EOS PCT: 0.7 % (ref 0.0–5.0)
Eosinophils Absolute: 0.1 10*3/uL (ref 0.0–0.7)
HCT: 44.6 % (ref 36.0–46.0)
HEMOGLOBIN: 15.1 g/dL — AB (ref 12.0–15.0)
LYMPHS PCT: 18.2 % (ref 12.0–46.0)
Lymphs Abs: 1.3 10*3/uL (ref 0.7–4.0)
MCHC: 33.9 g/dL (ref 30.0–36.0)
MCV: 89.5 fl (ref 78.0–100.0)
MONOS PCT: 8.3 % (ref 3.0–12.0)
Monocytes Absolute: 0.6 10*3/uL (ref 0.1–1.0)
NEUTROS ABS: 5 10*3/uL (ref 1.4–7.7)
Neutrophils Relative %: 72.4 % (ref 43.0–77.0)
Platelets: 262 10*3/uL (ref 150.0–400.0)
RBC: 4.98 Mil/uL (ref 3.87–5.11)
RDW: 13.8 % (ref 11.5–15.5)
WBC: 6.9 10*3/uL (ref 4.0–10.5)

## 2014-11-22 LAB — LIPASE: Lipase: 51 U/L (ref 11.0–59.0)

## 2014-11-22 NOTE — Patient Instructions (Addendum)
Will arrange abdominal us to check gall bladder area and  you surgeon.  Lab today .  Consideration of gall bladder . As a cause vs gastritis .  In the  Interim   Can try prilosec for  2 weeks .  Stop the ASA for now until better

## 2014-11-22 NOTE — Progress Notes (Signed)
Pre visit review using our clinic review tool, if applicable. No additional management support is needed unless otherwise documented below in the visit note.' Chief Complaint  Patient presents with  . Abdominal Pain    Cramping after meals.  No nausea, fever,  vomiting or diarrhea.  . Loss of Appetite  . Weight Loss    HPI: Patient Linda Richard  comes in today for SDA for  new problem evaluation. since last time  ( S/P Post hosp for perf after colon felt from diverticulitis rx  Conservatively with observation and antibiotic .Marland Kitchen...)  Had gyne check Nl ca125  And us and gyne check  Ok.  Urology Mcdiar  And   Patrici RanksGave toviaz  4 mg  For 3 days   Helps  Some.  Saw  Surgeon also in F/U.     Advised normal diet and fiber.  Has developed post prandial midline abd pain soreness with anorexia no vomiting . Not  Not sure what to do with this .  Had upper cramping  At first and then got worse  After this   Feels like "stomach virus for days " no blood in stool  ROS: See pertinent positives and negatives per HPI. No fever vomiting  ? Worse with spicy foods   Past Medical History  Diagnosis Date  . DVT (deep vein thrombosis) in pregnancy   . Varicose veins     strip and laser  . History of renal stone     2011  . Normal echocardiogram 2011    mild lvh ? aortic sclerosis no as    . Chronic kidney disease 2012    kidney stone    Family History  Problem Relation Age of Onset  . Hypertension    . Kidney disease      renal stones  . Arthritis    . Cancer Father     bladder   2950s   . Ovarian cancer Mother     9670s  . Other      son age 62 irreg HB  had cardioversion   . Thyroid disease    . Colon polyps Neg Hx   . Colon cancer Neg Hx   . Rectal cancer Neg Hx   . Stomach cancer Neg Hx     History   Social History  . Marital Status: Married    Spouse Name: N/A  . Number of Children: N/A  . Years of Education: N/A   Social History Main Topics  . Smoking status: Never Smoker   .  Smokeless tobacco: Never Used  . Alcohol Use: 0.0 oz/week    0 Standard drinks or equivalent per week     Comment: 3 x a week, mostly wine no t daily]  . Drug Use: No  . Sexual Activity: Not on file   Other Topics Concern  . None   Social History Narrative   HH of 2    Grandchild  Sons    [employer] Child Support   Married.    No  ets tobacco No  Exercise restriction   etoh 2 x per week.    Exercise good.       G3P2    Outpatient Prescriptions Prior to Visit  Medication Sig Dispense Refill  . acetaminophen (TYLENOL) 650 MG CR tablet Take 650 mg by mouth every 8 (eight) hours as needed for pain.    Marland Kitchen. aspirin 81 MG tablet Take 81 mg by mouth daily.      .Marland Kitchen  calcium-vitamin D (OSCAL WITH D) 500-200 MG-UNIT per tablet Take 1 tablet by mouth daily.      . cholecalciferol (VITAMIN D) 1000 UNITS tablet Take 1,000 Units by mouth daily.      Marland Kitchen ibuprofen (ADVIL,MOTRIN) 200 MG tablet Take 200 mg by mouth every 6 (six) hours as needed.    . Multiple Vitamins-Minerals (ONE DAILY WOMENS 50+ PO) Take 1 tablet by mouth daily.    . Probiotic Product (PROBIOTIC DAILY PO) Take by mouth.    . vitamin C (ASCORBIC ACID) 500 MG tablet Take 500 mg by mouth daily.    . ondansetron (ZOFRAN) 4 MG tablet Take 1 tablet (4 mg total) by mouth every 8 (eight) hours as needed for nausea or vomiting. (Patient not taking: Reported on 10/11/2014) 15 tablet 0  . traMADol (ULTRAM) 50 MG tablet Take 1 tablet (50 mg total) by mouth every 6 (six) hours as needed for moderate pain. (Patient not taking: Reported on 10/11/2014) 30 tablet 0   No facility-administered medications prior to visit.     EXAM:  BP 122/90 mmHg  Temp(Src) 98.5 F (36.9 C) (Oral)  Wt 119 lb 6.4 oz (54.159 kg)  Body mass index is 17.62 kg/(m^2).  GENERAL: vitals reviewed and listed above, alert, oriented, appears well hydrated and in no acute distress no non toxic  HEENT: atraumatic, conjunctiva  clear, no obvious abnormalities on inspection  of external nose and ears  tms   NECK: no obvious masses on inspection palpation  LUNGS: clear to auscultation bilaterally, no wheezes, rales or rhonchi, good air movement CV: HRRR, no clubbing cyanosis or  peripheral edema nl cap refill  Abd:   bs nl mild epigastric pain no reboud nl liver spleen. MS: moves all extremities without noticeable focal  abnormality PSYCH: pleasant and cooperative, no obvious depression or anxiety Wt Readings from Last 3 Encounters:  11/22/14 119 lb 6.4 oz (54.159 kg)  10/24/14 121 lb (54.885 kg)  10/18/14 121 lb 12.8 oz (55.248 kg)     ASSESSMENT AND PLAN:  Discussed the following assessment and plan:  Abdominal pain, epigastric post prandial new onset  - new onset post prandial  consider gall bladder vs gastritis  asa etc. see ct had gallstone - Plan: US Abdomen Complete, CBC with Differential/Platelet, Hepatic function panel, Lipase  Abdominal cramps - Plan: US Abdomen Complete, CBC with Differential/Platelet, Hepatic function panel, Lipase  Gallstone Episodic pain    In the past weeks no vomiting bowel  Not constipatted feels   Reviewed data  No obstruction and predated onset of Toviaz.   -Patient advised to return or notify health care team  if symptoms worsen ,persist or new concerns arise. In the interim  Plan surgery consult after labs and [place] if appropriate   Patient Instructions  Will arrange abdominal US to check gall bladder area and  you surgeon.  Lab today .  Consideration of gall bladder . As a cause vs gastritis .  In the  Interim   Can try prilosec for  2 weeks .  Stop the ASA for now until better   [facility] K. Panosh M.D.

## 2014-11-25 ENCOUNTER — Ambulatory Visit
Admission: RE | Admit: 2014-11-25 | Discharge: 2014-11-25 | Disposition: A | Discharge status: Home / Self Care | Payer: 59 | Source: Ambulatory Visit | Attending: Internal Medicine | Admitting: Internal Medicine

## 2014-11-25 DIAGNOSIS — R1013 Epigastric pain: Secondary | ICD-10-CM

## 2014-11-25 DIAGNOSIS — R109 Unspecified abdominal pain: Secondary | ICD-10-CM

## 2014-12-07 ENCOUNTER — Other Ambulatory Visit: Payer: Self-pay | Admitting: Family Medicine

## 2014-12-07 DIAGNOSIS — K8020 Calculus of gallbladder without cholecystitis without obstruction: Secondary | ICD-10-CM

## 2015-01-02 ENCOUNTER — Telehealth: Payer: Self-pay | Admitting: Internal Medicine

## 2015-01-02 NOTE — Telephone Encounter (Signed)
Pt said she has all kind of scans done this year and does not think she need a complete physical. Pt does want to have lab work done. She would like to know what Dr Fabian Sharp thinks about this.

## 2015-01-03 NOTE — Telephone Encounter (Signed)
I agree  She appears to be upt to date on health care parameters  Get a flu vaccine in fall  Adult vaccines due  Topic Date Due  . TETANUS/TDAP  11/02/2015  . ZOSTAVAX  Completed   See her  For preventive wellness next year  May  Or thereabouts

## 2015-01-03 NOTE — Telephone Encounter (Signed)
Pt notified that she does not need cpx or lab work.  She will call for a flu vaccine.

## 2015-01-17 ENCOUNTER — Other Ambulatory Visit: Payer: Self-pay | Admitting: General Surgery

## 2015-01-17 LAB — HM MAMMOGRAPHY

## 2015-01-19 NOTE — Pre-Procedure Instructions (Signed)
ARYSSA ROSAMOND  01/19/2015      [id number] [place], [place] - [address] [place] [address] Phone: [phone] Fax: [phone]  [facility] - [place], [place] - [address] [place] [address] Phone: [phone] Fax: [phone]  [facility] [address] [place], [place] - [address] [place] [address] Phone: [phone] Fax: [phone]    Your procedure is scheduled on Mon, Sept 12 @ 7:30 AM  Report to [facility] Admitting at 5:30 AM  Call this number if you have problems the morning of surgery:  [phone]   Remember:  Do not eat food or drink liquids after midnight.                No Goody's,BC's,Aleve,Aspirin,Ibuprofen,Fish Oil,or any Herbal Medications.    Do not wear jewelry, make-up or nail polish.  Do not wear lotions, powders, or perfumes.  You may wear deodorant.  Do not shave 48 hours prior to surgery.    Do not bring valuables to the hospital.  [facility] is not responsible for any belongings or valuables.  Contacts, dentures or bridgework may not be worn into surgery.  Leave your suitcase in the car.  After surgery it may be brought to your room.  For patients admitted to the hospital, discharge time will be determined by your treatment team.  Patients discharged the day of surgery will not be allowed to drive home.    Special instructions:  [place] - Preparing for Surgery  Before surgery, you can play an important role.  Because skin is not sterile, your skin needs to be as free of germs as possible.  You can reduce the number of germs on you skin by washing with CHG (chlorahexidine gluconate) soap before surgery.  CHG is an antiseptic cleaner which kills germs and bonds with the skin to continue killing germs even after washing.  Please DO NOT use if you have an allergy to CHG or antibacterial  soaps.  If your skin becomes reddened/irritated stop using the CHG and inform your nurse when you arrive at Short Stay.  Do not shave (including legs and underarms) for at least 48 hours prior to the first CHG shower.  You may shave your face.  Please follow these instructions carefully:   1.  Shower with CHG Soap the night before surgery and the                                morning of Surgery.  2.  If you choose to wash your hair, wash your hair first as usual with your       normal shampoo.  3.  After you shampoo, rinse your hair and body thoroughly to remove the                      Shampoo.  4.  Use CHG as you would any other liquid soap.  You can apply chg directly       to the skin and wash gently with scrungie or a clean washcloth.  5.  Apply the CHG Soap to your body ONLY FROM THE NECK DOWN.        Do not use on open wounds or open sores.  Avoid contact with your eyes,  ears, mouth and genitals (private parts).  Wash genitals (private parts)       with your normal soap.  6.  Wash thoroughly, paying special attention to the area where your surgery        will be performed.  7.  Thoroughly rinse your body with warm water from the neck down.  8.  DO NOT shower/wash with your normal soap after using and rinsing off       the CHG Soap.  9.  Pat yourself dry with a clean towel.            10.  Wear clean pajamas.            11.  Place clean sheets on your bed the night of your first shower and do not        sleep with pets.  Day of Surgery  Do not apply any lotions/deoderants the morning of surgery.  Please wear clean clothes to the hospital/surgery center.    Please read over the following fact sheets that you were given. Pain Booklet, Coughing and Deep Breathing and Surgical Site Infection Prevention

## 2015-01-20 ENCOUNTER — Encounter (HOSPITAL_COMMUNITY): Payer: Self-pay

## 2015-01-20 ENCOUNTER — Other Ambulatory Visit (HOSPITAL_COMMUNITY): Payer: 59

## 2015-01-20 ENCOUNTER — Encounter: Payer: Self-pay | Admitting: Family Medicine

## 2015-01-20 ENCOUNTER — Encounter (HOSPITAL_COMMUNITY)
Admission: RE | Admit: 2015-01-20 | Discharge: 2015-01-20 | Disposition: A | Discharge status: Home / Self Care | Payer: 59 | Source: Ambulatory Visit | Attending: General Surgery | Admitting: General Surgery

## 2015-01-20 DIAGNOSIS — K8020 Calculus of gallbladder without cholecystitis without obstruction: Secondary | ICD-10-CM | POA: Diagnosis not present

## 2015-01-20 DIAGNOSIS — Z01812 Encounter for preprocedural laboratory examination: Secondary | ICD-10-CM | POA: Diagnosis present

## 2015-01-20 HISTORY — DX: Pain in unspecified joint: M25.50

## 2015-01-20 HISTORY — DX: Urgency of urination: R39.15

## 2015-01-20 HISTORY — DX: Personal history of other infectious and parasitic diseases: Z86.19

## 2015-01-20 HISTORY — DX: Personal history of urinary calculi: Z87.442

## 2015-01-20 HISTORY — DX: Anxiety disorder, unspecified: F41.9

## 2015-01-20 HISTORY — DX: Frequency of micturition: R35.0

## 2015-01-20 HISTORY — DX: Pneumonia, unspecified organism: J18.9

## 2015-01-20 LAB — CBC WITH DIFFERENTIAL/PLATELET
BASOS ABS: 0 10*3/uL (ref 0.0–0.1)
BASOS PCT: 0 % (ref 0–1)
EOS ABS: 0.1 10*3/uL (ref 0.0–0.7)
EOS PCT: 2 % (ref 0–5)
HCT: 44.1 % (ref 36.0–46.0)
Hemoglobin: 15 g/dL (ref 12.0–15.0)
Lymphocytes Relative: 23 % (ref 12–46)
Lymphs Abs: 1.2 10*3/uL (ref 0.7–4.0)
MCH: 30.5 pg (ref 26.0–34.0)
MCHC: 34 g/dL (ref 30.0–36.0)
MCV: 89.6 fL (ref 78.0–100.0)
MONO ABS: 0.6 10*3/uL (ref 0.1–1.0)
MONOS PCT: 11 % (ref 3–12)
NEUTROS ABS: 3.4 10*3/uL (ref 1.7–7.7)
Neutrophils Relative %: 64 % (ref 43–77)
PLATELETS: 181 10*3/uL (ref 150–400)
RBC: 4.92 MIL/uL (ref 3.87–5.11)
RDW: 13.2 % (ref 11.5–15.5)
WBC: 5.3 10*3/uL (ref 4.0–10.5)

## 2015-01-20 LAB — COMPREHENSIVE METABOLIC PANEL
ALBUMIN: 3.8 g/dL (ref 3.5–5.0)
ALT: 39 U/L (ref 14–54)
ANION GAP: 7 (ref 5–15)
AST: 34 U/L (ref 15–41)
Alkaline Phosphatase: 55 U/L (ref 38–126)
BUN: 11 mg/dL (ref 6–20)
CHLORIDE: 103 mmol/L (ref 101–111)
CO2: 29 mmol/L (ref 22–32)
Calcium: 9.5 mg/dL (ref 8.9–10.3)
Creatinine, Ser: 0.48 mg/dL (ref 0.44–1.00)
GFR calc Af Amer: >60 mL/min (ref >60)
GFR calc non Af Amer: >60 mL/min (ref >60)
GLUCOSE: 80 mg/dL (ref 65–99)
POTASSIUM: 3.8 mmol/L (ref 3.5–5.1)
Sodium: 139 mmol/L (ref 135–145)
TOTAL PROTEIN: 6.5 g/dL (ref 6.5–8.1)
Total Bilirubin: 0.7 mg/dL (ref 0.3–1.2)

## 2015-01-20 NOTE — Progress Notes (Addendum)
Cardiologist denies having one  Medical Md is Dr.Wanda Panosh  Echo report in epic from 2011  Stress test denies ever having one  Heart cath denies ever having one  EKG in epic from 10-06-14  CXR denies having one

## 2015-01-29 MED ORDER — CEFAZOLIN SODIUM-DEXTROSE 2-3 GM-% IV SOLR
2.0000 g | INTRAVENOUS | Status: AC
  Administered 2015-01-30: 2 g via INTRAVENOUS
  Filled 2015-01-29: qty 50

## 2015-01-30 ENCOUNTER — Ambulatory Visit (HOSPITAL_COMMUNITY)
Admission: RE | Admit: 2015-01-30 | Discharge: 2015-01-30 | Disposition: A | Discharge status: Home / Self Care | Payer: 59 | Source: Ambulatory Visit | Attending: General Surgery | Admitting: General Surgery

## 2015-01-30 ENCOUNTER — Encounter (HOSPITAL_COMMUNITY): Payer: Self-pay | Admitting: *Deleted

## 2015-01-30 ENCOUNTER — Encounter (HOSPITAL_COMMUNITY)
Admission: RE | Disposition: A | Discharge status: Home / Self Care | Payer: Self-pay | Source: Ambulatory Visit | Attending: General Surgery

## 2015-01-30 ENCOUNTER — Ambulatory Visit (HOSPITAL_COMMUNITY): Payer: 59 | Admitting: Certified Registered"

## 2015-01-30 DIAGNOSIS — K8010 Calculus of gallbladder with chronic cholecystitis without obstruction: Secondary | ICD-10-CM | POA: Diagnosis present

## 2015-01-30 DIAGNOSIS — Z87891 Personal history of nicotine dependence: Secondary | ICD-10-CM | POA: Insufficient documentation

## 2015-01-30 HISTORY — PX: CHOLECYSTECTOMY: SHX55

## 2015-01-30 SURGERY — LAPAROSCOPIC CHOLECYSTECTOMY
Anesthesia: General | Site: Abdomen

## 2015-01-30 MED ORDER — OXYCODONE HCL 5 MG PO TABS: 5.0000 mg | ORAL_TABLET | Freq: Once | ORAL | Status: DC | PRN

## 2015-01-30 MED ORDER — EPHEDRINE SULFATE 50 MG/ML IJ SOLN
INTRAMUSCULAR | Status: AC
  Filled 2015-01-30: qty 1

## 2015-01-30 MED ORDER — BUPIVACAINE-EPINEPHRINE (PF) 0.25% -1:200000 IJ SOLN
INTRAMUSCULAR | Status: AC
  Filled 2015-01-30: qty 30

## 2015-01-30 MED ORDER — PROPOFOL 10 MG/ML IV BOLUS
INTRAVENOUS | Status: DC | PRN
  Administered 2015-01-30: 40 mg via INTRAVENOUS
  Administered 2015-01-30: 20 mg via INTRAVENOUS
  Administered 2015-01-30: 140 mg via INTRAVENOUS

## 2015-01-30 MED ORDER — FENTANYL CITRATE (PF) 250 MCG/5ML IJ SOLN
INTRAMUSCULAR | Status: AC
  Filled 2015-01-30: qty 5

## 2015-01-30 MED ORDER — ARTIFICIAL TEARS OP OINT
TOPICAL_OINTMENT | OPHTHALMIC | Status: DC | PRN
  Administered 2015-01-30: 1 via OPHTHALMIC

## 2015-01-30 MED ORDER — OXYCODONE HCL 5 MG/5ML PO SOLN: 5.0000 mg | Freq: Once | ORAL | Status: DC | PRN

## 2015-01-30 MED ORDER — ROCURONIUM BROMIDE 100 MG/10ML IV SOLN
INTRAVENOUS | Status: DC | PRN
  Administered 2015-01-30: 40 mg via INTRAVENOUS

## 2015-01-30 MED ORDER — ACETAMINOPHEN 325 MG PO TABS: 325.0000 mg | ORAL_TABLET | ORAL | Status: DC | PRN

## 2015-01-30 MED ORDER — ONDANSETRON HCL 4 MG/2ML IJ SOLN
INTRAMUSCULAR | Status: DC | PRN
  Administered 2015-01-30: 4 mg via INTRAVENOUS

## 2015-01-30 MED ORDER — LIDOCAINE HCL (CARDIAC) 20 MG/ML IV SOLN
INTRAVENOUS | Status: AC
Start: 2015-01-30 — End: 2015-01-30
  Filled 2015-01-30: qty 15

## 2015-01-30 MED ORDER — FENTANYL CITRATE (PF) 100 MCG/2ML IJ SOLN
INTRAMUSCULAR | Status: DC
Start: 2015-01-30 — End: 2015-01-30
  Filled 2015-01-30: qty 2

## 2015-01-30 MED ORDER — LIDOCAINE HCL (CARDIAC) 20 MG/ML IV SOLN
INTRAVENOUS | Status: DC | PRN
  Administered 2015-01-30: 60 mg via INTRAVENOUS

## 2015-01-30 MED ORDER — FENTANYL CITRATE (PF) 100 MCG/2ML IJ SOLN
25.0000 ug | INTRAMUSCULAR | Status: DC | PRN
  Administered 2015-01-30: 25 ug via INTRAVENOUS
  Administered 2015-01-30: 50 ug via INTRAVENOUS
  Administered 2015-01-30: 25 ug via INTRAVENOUS

## 2015-01-30 MED ORDER — ACETAMINOPHEN 160 MG/5ML PO SOLN
325.0000 mg | ORAL | Status: DC | PRN
  Filled 2015-01-30: qty 20.3

## 2015-01-30 MED ORDER — ARTIFICIAL TEARS OP OINT
TOPICAL_OINTMENT | OPHTHALMIC | Status: AC
  Filled 2015-01-30: qty 3.5

## 2015-01-30 MED ORDER — OXYCODONE HCL 5 MG PO TABS: 5.0000 mg | ORAL_TABLET | Freq: Four times a day (QID) | ORAL | Status: DC | PRN

## 2015-01-30 MED ORDER — ROCURONIUM BROMIDE 50 MG/5ML IV SOLN
INTRAVENOUS | Status: AC
  Filled 2015-01-30: qty 1

## 2015-01-30 MED ORDER — 0.9 % SODIUM CHLORIDE (POUR BTL) OPTIME
TOPICAL | Status: DC | PRN
  Administered 2015-01-30: 1000 mL

## 2015-01-30 MED ORDER — SODIUM CHLORIDE 0.9 % IJ SOLN
INTRAMUSCULAR | Status: AC
  Filled 2015-01-30: qty 10

## 2015-01-30 MED ORDER — SODIUM CHLORIDE 0.9 % IR SOLN
Status: DC | PRN
  Administered 2015-01-30: 1

## 2015-01-30 MED ORDER — MIDAZOLAM HCL 5 MG/5ML IJ SOLN
INTRAMUSCULAR | Status: DC | PRN
  Administered 2015-01-30: 2 mg via INTRAVENOUS

## 2015-01-30 MED ORDER — FENTANYL CITRATE (PF) 100 MCG/2ML IJ SOLN
INTRAMUSCULAR | Status: DC | PRN
  Administered 2015-01-30: 100 ug via INTRAVENOUS
  Administered 2015-01-30: 50 ug via INTRAVENOUS

## 2015-01-30 MED ORDER — EPHEDRINE SULFATE 50 MG/ML IJ SOLN
INTRAMUSCULAR | Status: DC | PRN
  Administered 2015-01-30: 10 mg via INTRAVENOUS

## 2015-01-30 MED ORDER — NEOSTIGMINE METHYLSULFATE 10 MG/10ML IV SOLN
INTRAVENOUS | Status: DC | PRN
  Administered 2015-01-30: 4 mg via INTRAVENOUS

## 2015-01-30 MED ORDER — GLYCOPYRROLATE 0.2 MG/ML IJ SOLN
INTRAMUSCULAR | Status: DC | PRN
  Administered 2015-01-30: 0.2 mg via INTRAVENOUS
  Administered 2015-01-30: 0.6 mg via INTRAVENOUS

## 2015-01-30 MED ORDER — MIDAZOLAM HCL 2 MG/2ML IJ SOLN
INTRAMUSCULAR | Status: AC
  Filled 2015-01-30: qty 4

## 2015-01-30 MED ORDER — LIDOCAINE HCL (CARDIAC) 20 MG/ML IV SOLN
INTRAVENOUS | Status: AC
  Filled 2015-01-30: qty 5

## 2015-01-30 MED ORDER — BUPIVACAINE-EPINEPHRINE 0.25% -1:200000 IJ SOLN
INTRAMUSCULAR | Status: DC | PRN
  Administered 2015-01-30: 11 mL

## 2015-01-30 MED ORDER — ONDANSETRON HCL 4 MG/2ML IJ SOLN
INTRAMUSCULAR | Status: AC
  Filled 2015-01-30: qty 2

## 2015-01-30 MED ORDER — LACTATED RINGERS IV SOLN
INTRAVENOUS | Status: DC | PRN
  Administered 2015-01-30 (×2): via INTRAVENOUS

## 2015-01-30 MED ORDER — PROPOFOL 10 MG/ML IV BOLUS
INTRAVENOUS | Status: AC
  Filled 2015-01-30: qty 20

## 2015-01-30 SURGICAL SUPPLY — 39 items
APPLIER CLIP 5 13 M/L LIGAMAX5 (MISCELLANEOUS) ×3
BLADE SURG ROTATE 9660 (MISCELLANEOUS) IMPLANT
CANISTER SUCTION 2500CC (MISCELLANEOUS) ×3 IMPLANT
CHLORAPREP W/TINT 26ML (MISCELLANEOUS) ×3 IMPLANT
CLIP APPLIE 5 13 M/L LIGAMAX5 (MISCELLANEOUS) ×1 IMPLANT
CLOSURE WOUND 1/2 X4 (GAUZE/BANDAGES/DRESSINGS) ×1
COVER SURGICAL LIGHT HANDLE (MISCELLANEOUS) ×3 IMPLANT
DEVICE TROCAR PUNCTURE CLOSURE (ENDOMECHANICALS) ×3 IMPLANT
ELECT REM PT RETURN 9FT ADLT (ELECTROSURGICAL) ×3
ELECTRODE REM PT RTRN 9FT ADLT (ELECTROSURGICAL) ×1 IMPLANT
GLOVE BIO SURGEON STRL SZ7 (GLOVE) ×6 IMPLANT
GLOVE BIOGEL PI IND STRL 7.0 (GLOVE) ×1 IMPLANT
GLOVE BIOGEL PI IND STRL 7.5 (GLOVE) ×3 IMPLANT
GLOVE BIOGEL PI INDICATOR 7.0 (GLOVE) ×2
GLOVE BIOGEL PI INDICATOR 7.5 (GLOVE) ×6
GLOVE ECLIPSE 7.5 STRL STRAW (GLOVE) ×3 IMPLANT
GLOVE SURG SS PI 7.0 STRL IVOR (GLOVE) ×3 IMPLANT
GOWN STRL REUS W/ TWL LRG LVL3 (GOWN DISPOSABLE) ×3 IMPLANT
GOWN STRL REUS W/TWL LRG LVL3 (GOWN DISPOSABLE) ×6
KIT BASIN OR (CUSTOM PROCEDURE TRAY) ×3 IMPLANT
KIT ROOM TURNOVER OR (KITS) ×3 IMPLANT
LIQUID BAND (GAUZE/BANDAGES/DRESSINGS) ×3 IMPLANT
NS IRRIG 1000ML POUR BTL (IV SOLUTION) ×3 IMPLANT
PAD ARMBOARD 7.5X6 YLW CONV (MISCELLANEOUS) ×3 IMPLANT
POUCH RETRIEVAL ECOSAC 10 (ENDOMECHANICALS) ×1 IMPLANT
POUCH RETRIEVAL ECOSAC 10MM (ENDOMECHANICALS) ×2
SCISSORS LAP 5X35 DISP (ENDOMECHANICALS) ×3 IMPLANT
SET IRRIG TUBING LAPAROSCOPIC (IRRIGATION / IRRIGATOR) ×3 IMPLANT
SLEEVE ENDOPATH XCEL 5M (ENDOMECHANICALS) ×9 IMPLANT
SPECIMEN JAR SMALL (MISCELLANEOUS) ×3 IMPLANT
STRIP CLOSURE SKIN 1/2X4 (GAUZE/BANDAGES/DRESSINGS) ×2 IMPLANT
SUT MNCRL AB 4-0 PS2 18 (SUTURE) ×3 IMPLANT
SUT VICRYL 0 UR6 27IN ABS (SUTURE) ×3 IMPLANT
TOWEL OR 17X24 6PK STRL BLUE (TOWEL DISPOSABLE) ×3 IMPLANT
TOWEL OR 17X26 10 PK STRL BLUE (TOWEL DISPOSABLE) ×3 IMPLANT
TRAY LAPAROSCOPIC MC (CUSTOM PROCEDURE TRAY) ×3 IMPLANT
TROCAR XCEL BLUNT TIP 100MML (ENDOMECHANICALS) ×3 IMPLANT
TROCAR XCEL NON-BLD 5MMX100MML (ENDOMECHANICALS) ×3 IMPLANT
TUBING INSUFFLATION (TUBING) ×3 IMPLANT

## 2015-01-30 NOTE — Op Note (Signed)
Preoperative diagnosis: gallstones Postoperative diagnosis: same as above Procedure: laparoscopic cholecystectomy  Surgeon: Dr Harden Mo Anesthesia: general EBL: minimal Drains none Specimen gb and contents to pathology Complications: none Sponge count correct at completion Disposition to recovery stable  Indications: This is an 89 yof with gallstones on [place] and symptoms that appear associated with the stones.   We discussed proceeding with lap chole.   Procedure: After informed consent was obtained the patient was taken to the operating room. She was already given antibiotics. Sequential compression devices were on her legs. She was placed under general anesthesia without complication. Her abdomen was prepped and draped in the standard sterile surgical fashion. A surgical timeout was then performed.  I infiltrated marcaine below the umbilicus. I made an incision and then entered the fascia sharply. I then entered the peritoneum bluntly. There was no evidence of an entry injury. I placed a 0 vicryl pursestring suture and inserted a hasson trocar. I then inserted 3 further 5 mm trocars in the epigastrium and ruq. The duodenum was adherent and this was taken down bluntly. I then was able to retract the gallbladder cephalad and lateral.  Eventually I was able to identify the cystic duct and clearly had the critical view of safety. I then clipped the cystic duct and divided it. The duct was viable and the clips traversed the duct.  I then clipped the cystic artery and divided it. I then removed the gallbladder from the liver bed and placed it in a bag. It was then removed from the umbilical incision. I then obtained hemostasis and irrigated. I then removed the umbilical trocar and closed with 0 vicryl and the endoclose device after tying down the pursestring.  I then desufflated the abdomen and removed all my remaining trocars. I then closed these with 4-0 Monocryl and Dermabond. She  tolerated this well was extubated and transferred to the recovery room in stable condition

## 2015-01-30 NOTE — Interval H&P Note (Signed)
History and Physical Interval Note:  01/30/2015 7:12 AM  Linda Richard  has presented today for surgery, with the diagnosis of SYMPTOMATIC GALLSTONES  The various methods of treatment have been discussed with the patient and family. After consideration of risks, benefits and other options for treatment, the patient has consented to  Procedure(s): LAPAROSCOPIC CHOLECYSTECTOMY (N/A) as a surgical intervention .  The patient's history has been reviewed, patient examined, no change in status, stable for surgery.  I have reviewed the patient's chart and labs.  Questions were answered to the patient's satisfaction.     Kennita Pavlovich

## 2015-01-30 NOTE — H&P (Signed)
  88 yof I know from recent admission diverticular disease with colonoscopic perforation. she since has brought up that for about 2 years she has had epigastric pain after eating some foods. she has changed her diet recently. her ct scan showed a gallstone previously and underwent recent US with a 1.5 cm gallstone.   Other Problems Emelia Loron, MD; 01/09/2015 3:54 PM) Back Pain Bladder Problems Diverticulosis Kidney Stone Pulmonary Embolism / Blood Clot in Legs  Past Surgical History Emelia Loron, MD; 01/09/2015 3:54 PM) Cesarean Section - 1 Oral Surgery Tonsillectomy  Allergies Lamar Laundry Bynum, CMA; 01/09/2015 3:20 PM) No Known Drug Allergies06/01/2015  Medication History Gilmer Mor, CMA; 01/09/2015 3:20 PM) Tylenol Extra Strength (  Tablet, Oral) Active. Aspirin (  Tablet, Oral) Active. Calcium-Magnesium (500-250MG  Tablet, Oral) Active. Vitamin D (1000UNIT Capsule, Oral) Active. Multi-Minerals (Oral) Active. Probiotic Product (Oral) Active. Medications Reconciled  Social History Emelia Loron, MD; 01/09/2015 3:54 PM) Alcohol use Moderate alcohol use. Caffeine use Coffee. No drug use Tobacco use Former smoker.  Vitals (Sonya Bynum CMA; 01/09/2015 3:20 PM) 01/09/2015 3:19 PM Weight: 124 lb Height: 68in Body Surface Area: 1.64 m Body Mass Index: 18.85 kg/m Temp.: 98.66F(Temporal)  Pulse: 83 (Regular)  BP: 122/70 (Sitting, Left Arm, Standard)    Physical Exam Emelia Loron MD; 01/09/2015 3:47 PM) General Mental Status-Alert. Orientation-Oriented X3.  Chest and Lung Exam Chest and lung exam reveals -on auscultation, normal breath sounds, no adventitious sounds and normal vocal resonance.  Cardiovascular Cardiovascular examination reveals -normal heart sounds, regular rate and rhythm with no murmurs.  Abdomen Note: soft nt/nd well healed low midline incision     Assessment & Plan Emelia Loron MD; 01/09/2015 3:45 PM) GALLSTONES (574.20  K80.20) Story: It does appear her symptoms are from her gb. I recommended lap chole. I discussed the procedure in detail. The patient was given Agricultural engineer. We discussed the risks and benefits of a laparoscopic cholecystectomy and possible cholangiogram including, but not limited to bleeding, infection, injury to surrounding structures such as the intestine or liver, bile leak, retained gallstones, need to convert to an open procedure, prolonged diarrhea, blood clots such as DVT, common bile duct injury, anesthesia risks, and possible need for additional procedures. The likelihood of improvement in symptoms and return to the patient's normal status is good. We discussed the typical post-operative recovery course.

## 2015-01-30 NOTE — Transfer of Care (Signed)
Immediate Anesthesia Transfer of Care Note  Patient: Linda Richard  Procedure(s) Performed: Procedure(s): LAPAROSCOPIC CHOLECYSTECTOMY (N/A)  Patient Location: PACU  Anesthesia Type:General  Level of Consciousness: awake, alert  and oriented  Airway & Oxygen Therapy: Patient Spontanous Breathing and Patient connected to nasal cannula oxygen  Post-op Assessment: Report given to RN, Post -op Vital signs reviewed and stable and Patient moving all extremities X 4  Post vital signs: Reviewed and stable  Last Vitals:  Filed Vitals:   01/30/15 0620  BP: 140/58  Pulse: 58  Temp: 36.8 C  Resp: 18    Complications: No apparent anesthesia complications

## 2015-01-30 NOTE — Discharge Instructions (Signed)
[facility] LAPAROSCOPIC SURGERY: POST OP INSTRUCTIONS  Always review your discharge instruction sheet given to you by the facility where your surgery was performed. IF YOU HAVE DISABILITY OR FAMILY LEAVE FORMS, YOU MUST BRING THEM TO THE OFFICE FOR PROCESSING.   DO NOT GIVE THEM TO YOUR DOCTOR.  1. A prescription for pain medication may be given to you upon discharge.  Take your pain medication as prescribed, if needed.  If narcotic pain medicine is not needed, then you may take acetaminophen (Tylenol), naprosyn (Alleve), or ibuprofen (Advil) as needed. 2. Take your usually prescribed medications unless otherwise directed. 3. If you need a refill on your pain medication, please contact your pharmacy.  They will contact our office to request authorization. Prescriptions will not be filled after 5pm or on week-ends. 4. You should follow a light diet the first few days after arrival home, such as soup and crackers, etc.  Be sure to include lots of fluids daily. 5. Most patients will experience some swelling and bruising in the area of the incisions.  Ice packs will help.  Swelling and bruising can take several days to resolve.  6. It is common to experience some constipation if taking pain medication after surgery.  Increasing fluid intake and taking a stool softener (such as Colace) will usually help or prevent this problem from occurring.  A mild laxative (Milk of Magnesia or Miralax) should be taken according to package instructions if there are no bowel movements after 48 hours. 7. Unless discharge instructions indicate otherwise, you may remove your bandages 48 hours after surgery, and you may shower at that time.  You may have steri-strips (small skin tapes) in place directly over the incision.  These strips should be left on the skin for 7-10 days.  If your surgeon used skin glue on the incision, you may shower in 24 hours.  The glue will flake  off over the next 2-3 weeks.  Any sutures or staples will be removed at the office during your follow-up visit. 8. ACTIVITIES:  You may resume regular (light) daily activities beginning the next day--such as daily self-care, walking, climbing stairs--gradually increasing activities as tolerated.  You may have sexual intercourse when it is comfortable.  Refrain from any heavy lifting or straining until approved by your doctor. a. You may drive when you are no longer taking prescription pain medication, you can comfortably wear a seatbelt, and you can safely maneuver your car and apply brakes. b. RETURN TO WORK:  __________________________________________________________ 9. You should see your doctor in the office for a follow-up appointment approximately 2-3 weeks after your surgery.  Make sure that you call for this appointment within a day or two after you arrive home to insure a convenient appointment time. 10. OTHER INSTRUCTIONS: __________________________________________________________________________________________________________________________ __________________________________________________________________________________________________________________________ WHEN TO CALL YOUR DOCTOR: 1. Fever over 101.0 2. Inability to urinate 3. Continued bleeding from incision. 4. Increased pain, redness, or drainage from the incision. 5. Increasing abdominal pain  The clinic staff is available to answer your questions during regular business hours.  Please don't hesitate to call and ask to speak to one of the nurses for clinical concerns.  If you have a medical emergency, go to the nearest emergency room or call 911.  A surgeon from [facility] is always on call at the hospital. [address], [address], [place], [place]  [address] ? [address] [address], [place], [place]   [address] [phone] ? [phone] ? [phone] Web site: [email or web address]  

## 2015-01-30 NOTE — Anesthesia Procedure Notes (Signed)
Procedure Name: Intubation Date/Time: 01/30/2015 7:44 AM Performed by: Lanell Matar Pre-anesthesia Checklist: Patient identified, Emergency Drugs available, Suction available, Patient being monitored and Timeout performed Patient Re-evaluated:Patient Re-evaluated prior to inductionOxygen Delivery Method: Circle system utilized Preoxygenation: Pre-oxygenation with 100% oxygen Intubation Type: IV induction Ventilation: Mask ventilation without difficulty Laryngoscope Size: Miller and 2 Grade View: Grade I Tube type: Oral Tube size: 7.0 mm Number of attempts: 1 Airway Equipment and Method: Stylet Placement Confirmation: ETT inserted through vocal cords under direct vision,  positive ETCO2 and breath sounds checked- equal and bilateral Secured at: 21 cm Tube secured with: Tape Dental Injury: Teeth and Oropharynx as per pre-operative assessment

## 2015-01-30 NOTE — Progress Notes (Signed)
Received report from [place], assuming care of patient at this time

## 2015-01-30 NOTE — Anesthesia Postprocedure Evaluation (Signed)
  Anesthesia Post-op Note  Patient: Linda Richard  Procedure(s) Performed: Procedure(s): LAPAROSCOPIC CHOLECYSTECTOMY (N/A)  Patient Location: PACU  Anesthesia Type:General  Level of Consciousness: awake  Airway and Oxygen Therapy: Patient Spontanous Breathing  Post-op Pain: mild  Post-op Assessment: Post-op Vital signs reviewed, Patient's Cardiovascular Status Stable, Respiratory Function Stable, Patent Airway, No signs of Nausea or vomiting and Pain level controlled              Post-op Vital Signs: Reviewed and stable  Last Vitals:  Filed Vitals:   01/30/15 0959  BP: 133/67  Pulse:   Temp:   Resp:     Complications: No apparent anesthesia complications

## 2015-01-30 NOTE — Anesthesia Preprocedure Evaluation (Addendum)
Anesthesia Evaluation  Patient identified by MRN, date of birth, ID band Patient awake    Reviewed: Allergy & Precautions, NPO status , Patient's Chart, lab work & pertinent test results  History of Anesthesia Complications Negative for: history of anesthetic complications  Airway Mallampati: I  TM Distance: >3 FB Neck ROM: Full    Dental  (+) Dental Advisory Given, Teeth Intact   Pulmonary neg pulmonary ROS,    breath sounds clear to auscultation       Cardiovascular (-) hypertension(-) angina+ Peripheral Vascular Disease  (-) Past MI and (-) CHF  Rhythm:Regular     Neuro/Psych PSYCHIATRIC DISORDERS Anxiety negative neurological ROS     GI/Hepatic Neg liver ROS, Gallstones    Endo/Other  negative endocrine ROS  Renal/GU negative Renal ROS     Musculoskeletal negative musculoskeletal ROS (+)   Abdominal   Peds  Hematology negative hematology ROS (+)   Anesthesia Other Findings   Reproductive/Obstetrics                            Anesthesia Physical Anesthesia Plan  ASA: II  Anesthesia Plan: General   Post-op Pain Management:    Induction: Intravenous  Airway Management Planned: Oral ETT  Additional Equipment: None  Intra-op Plan:   Post-operative Plan: Extubation in OR  Informed Consent: I have reviewed the patients History and Physical, chart, labs and discussed the procedure including the risks, benefits and alternatives for the proposed anesthesia with the patient or authorized representative who has indicated his/her understanding and acceptance.   Dental advisory given  Plan Discussed with: CRNA and Surgeon  Anesthesia Plan Comments:         Anesthesia Quick Evaluation

## 2015-01-31 ENCOUNTER — Encounter (HOSPITAL_COMMUNITY): Payer: Self-pay | Admitting: General Surgery

## 2015-02-06 ENCOUNTER — Encounter: Payer: Self-pay | Admitting: Vascular Surgery

## 2015-02-07 ENCOUNTER — Encounter: Payer: Self-pay | Admitting: Vascular Surgery

## 2015-02-07 ENCOUNTER — Ambulatory Visit (INDEPENDENT_AMBULATORY_CARE_PROVIDER_SITE_OTHER): Payer: Self-pay | Admitting: Vascular Surgery

## 2015-02-07 VITALS — BP 127/73 | HR 100 | Temp 98.0°F | Resp 16 | Ht 69.0 in | Wt 122.0 lb

## 2015-02-07 DIAGNOSIS — I83893 Varicose veins of bilateral lower extremities with other complications: Secondary | ICD-10-CM

## 2015-02-07 NOTE — Progress Notes (Signed)
Subjective:     Patient ID: Linda Richard, female   DOB: 1950/08/17, 64 y.o.   MRN: [id number]  HPI this 64 year old female the scene and final follow-up regarding her bilateral lower extremity venous procedures. Most recently she had bilateral stab phlebectomy of painful varicosities performed most recent of which was 10/24/2014. She states that she is having no discomfort where the varicosities were located. She is having no distal edema. She is not wearing elastic compression stockings. She has no complaints.   Review of Systems     Objective:   Physical Exam BP 127/73 mmHg  Pulse 100  Temp(Src) 98 F (36.7 C)  Resp 16  Ht  (1.753 m)  Wt 122 lb (55.339 kg)  BMI 18.01 kg/m2  SpO2 100%   Gen. Well-developed well-nourished female in no apparent distress alert and oriented 3 Lungs no rhonchi or wheezing Both lower extremities are free of obvious bulging varicosities with no distal edema. 3+ dorsalis pedis pulse palpable.     Assessment:      good result following laser ablation and bilateral stab phlebectomy's for painful varicosities with complete resolution of distal edema and discomfort     Plan:      return to see me on when necessary basis

## 2015-02-11 ENCOUNTER — Encounter: Payer: Self-pay | Admitting: Internal Medicine

## 2015-04-04 ENCOUNTER — Ambulatory Visit (INDEPENDENT_AMBULATORY_CARE_PROVIDER_SITE_OTHER): Payer: 59 | Admitting: *Deleted

## 2015-04-04 DIAGNOSIS — Z23 Encounter for immunization: Secondary | ICD-10-CM

## 2015-04-04 MED ORDER — TETANUS-DIPHTH-ACELL PERTUSSIS 5-2.5-18.5 LF-MCG/0.5 IM SUSP
0.5000 mL | Freq: Once | INTRAMUSCULAR | Status: AC
  Administered 2015-04-04: 0.5 mL via INTRAMUSCULAR

## 2015-10-04 ENCOUNTER — Encounter: Payer: Self-pay | Admitting: *Deleted

## 2015-10-11 ENCOUNTER — Ambulatory Visit (INDEPENDENT_AMBULATORY_CARE_PROVIDER_SITE_OTHER): Payer: 59 | Admitting: *Deleted

## 2015-10-11 DIAGNOSIS — I83893 Varicose veins of bilateral lower extremities with other complications: Secondary | ICD-10-CM

## 2015-10-11 NOTE — Progress Notes (Signed)
X=.3% Sotradecol administered with a 27g butterfly.  Patient received a total of 12cc. Treated spiders and reticulars. She has so many that I just focused on the darkest spiders rather than the red ones. Injected some reticulars that appear to be feeding spiders behind her left knee. Easy access. Tol well. Will follow prn.   Photos: Yes.    Compression stockings applied: Yes.

## 2015-10-12 ENCOUNTER — Encounter: Payer: Self-pay | Admitting: Vascular Surgery

## 2016-01-18 LAB — HM MAMMOGRAPHY

## 2016-01-23 ENCOUNTER — Encounter: Payer: Self-pay | Admitting: Family Medicine

## 2016-05-07 ENCOUNTER — Other Ambulatory Visit (INDEPENDENT_AMBULATORY_CARE_PROVIDER_SITE_OTHER): Payer: 59

## 2016-05-07 DIAGNOSIS — Z0000 Encounter for general adult medical examination without abnormal findings: Secondary | ICD-10-CM

## 2016-05-07 LAB — HEPATIC FUNCTION PANEL
ALBUMIN: 3.8 g/dL (ref 3.5–5.2)
ALT: 26 U/L (ref 0–35)
AST: 29 U/L (ref 0–37)
Alkaline Phosphatase: 67 U/L (ref 39–117)
Bilirubin, Direct: 0.1 mg/dL (ref 0.0–0.3)
TOTAL PROTEIN: 6.4 g/dL (ref 6.0–8.3)
Total Bilirubin: 0.5 mg/dL (ref 0.2–1.2)

## 2016-05-07 LAB — LIPID PANEL
CHOLESTEROL: 206 mg/dL — AB (ref 0–200)
HDL: 74.9 mg/dL (ref >39.00)
LDL Cholesterol: 123 mg/dL — ABNORMAL HIGH (ref 0–99)
NonHDL: 130.81
TRIGLYCERIDES: 39 mg/dL (ref 0.0–149.0)
Total CHOL/HDL Ratio: 3
VLDL: 7.8 mg/dL (ref 0.0–40.0)

## 2016-05-07 LAB — BASIC METABOLIC PANEL
BUN: 15 mg/dL (ref 6–23)
CHLORIDE: 103 meq/L (ref 96–112)
CO2: 29 meq/L (ref 19–32)
Calcium: 9 mg/dL (ref 8.4–10.5)
Creatinine, Ser: 0.51 mg/dL (ref 0.40–1.20)
GFR: 129.2 mL/min (ref >60.00)
Glucose, Bld: 91 mg/dL (ref 70–99)
POTASSIUM: 4.1 meq/L (ref 3.5–5.1)
Sodium: 139 mEq/L (ref 135–145)

## 2016-05-07 LAB — CBC WITH DIFFERENTIAL/PLATELET
BASOS PCT: 0.6 % (ref 0.0–3.0)
Basophils Absolute: 0 10*3/uL (ref 0.0–0.1)
EOS PCT: 2.8 % (ref 0.0–5.0)
Eosinophils Absolute: 0.1 10*3/uL (ref 0.0–0.7)
HEMATOCRIT: 42.3 % (ref 36.0–46.0)
HEMOGLOBIN: 14.3 g/dL (ref 12.0–15.0)
LYMPHS PCT: 40.6 % (ref 12.0–46.0)
Lymphs Abs: 1.8 10*3/uL (ref 0.7–4.0)
MCHC: 33.9 g/dL (ref 30.0–36.0)
MCV: 90.7 fl (ref 78.0–100.0)
Monocytes Absolute: 0.3 10*3/uL (ref 0.1–1.0)
Monocytes Relative: 7.8 % (ref 3.0–12.0)
NEUTROS ABS: 2.1 10*3/uL (ref 1.4–7.7)
Neutrophils Relative %: 48.2 % (ref 43.0–77.0)
Platelets: 211 10*3/uL (ref 150.0–400.0)
RBC: 4.66 Mil/uL (ref 3.87–5.11)
RDW: 14.7 % (ref 11.5–15.5)
WBC: 4.4 10*3/uL (ref 4.0–10.5)

## 2016-05-07 LAB — TSH: TSH: 1.58 u[IU]/mL (ref 0.35–4.50)

## 2016-05-10 NOTE — Progress Notes (Signed)
Pre visit review using our clinic review tool, if applicable. No additional management support is needed unless otherwise documented below in the visit note.  Chief Complaint  Patient presents with  . Annual Exam    HPI: Patient  Linda Richard  65 y.o. comes in today for [facility] visit  She is doing well after getting through her gallbladder surgery and some GU symptoms originally treated by urologist and then by her GYN. Vaginal dryness   In summer    And had lower  gu sx    Estrogen cream  Lexap[ro   And buspar.  Were added because of what she had been through and she is now doing quite well. Says she is up-to-date on immunizations her mammogram Etc. Still uses inversion for her back and maintains regular exercise. Has a minor cough chest cold using left over hydrocodone cough syrup from 2 years ago.  Health Maintenance  Topic Date Due  . Hepatitis C Screening  05/13/2017 (Originally [birth year])  . HIV Screening  05/13/2017 (Originally 09/25/1965)  . PAP SMEAR  10/18/2017  . MAMMOGRAM  01/17/2018  . COLONOSCOPY  09/29/2024  . TETANUS/TDAP  04/03/2025  . INFLUENZA VACCINE  Completed  . ZOSTAVAX  Completed   Health Maintenance Review LIFESTYLE:  Exercise:   Still 6- 7 miles per day .  Stretching .  Inversion table .  Tobacco/ETS: no Alcohol:  About 2  Per week  1 beer  Sugar beverages: ocasds gatorade 2  Sleep:   About 8   Drug use: no HH of   2  Work:  2  No pets   4 grandchildren  16 months  - 7 years     ROS:  Vv   Some rx . See history of present illness. No residual GI from her gallbladder situation. GEN/ HEENT: No fever, significant weight changes sweats headaches vision problems hearing changes, CV/ PULM; No chest pain shortness of breath , syncope,edema  change in exercise tolerance. GI /GU: No adominal pain, vomiting, change in bowel habits. No blood in the stool. No significant GU symptoms. SKIN/HEME: ,no acute skin rashes suspicious lesions or  bleeding. No lymphadenopathy, nodules, masses.  NEURO/ PSYCH:  No neurologic signs such as weakness numbness. No depression anxiety. IMM/ Allergy: No unusual infections.  Allergy .   REST of 12 system review negative except as per HPI   Past Medical History:  Diagnosis Date  . Anxiety    takes Lexapro daily-just started 01/19/15  . Diverticulitis of intestine with perforation 10/06/2014  . DVT (deep vein thrombosis) in pregnancy ([place]) 1983   behind left leg  . History of kidney stones 2011  . History of shingles   . Joint pain   . 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 Pneumonia as a child  . Urinary frequency   . Urinary urgency    just started taking Myrbetriq  . Varicose veins    strip and laser    Past Surgical History:  Procedure Laterality Date  . CESAREAN SECTION  1983  . CHOLECYSTECTOMY N/A 01/30/2015   Procedure: LAPAROSCOPIC CHOLECYSTECTOMY;  Surgeon: Rolm Bookbinder, MD;  Location: [facility];  Service: General;  Laterality: N/A;  . COLONOSCOPY    . TONSILLECTOMY  as a child  . veins stripped  80's   laser RX     Family History  Problem Relation Age of Onset  . Hypertension    . Kidney  disease      renal stones  . Arthritis    . Cancer Father     bladder   67s   . Ovarian cancer Mother     51s  . Other      son age 73 irreg HB  had cardioversion   . Thyroid disease    . Colon polyps Neg Hx   . Colon cancer Neg Hx   . Rectal cancer Neg Hx   . Stomach cancer Neg Hx     Social History   Social History  . Marital status: Married    Spouse name: N/A  . Number of children: N/A  . Years of education: N/A   Social History Main Topics  . Smoking status: Never Smoker  . Smokeless tobacco: Never Used  . Alcohol use 0.0 oz/week     Comment: 3 x a week, mostly wine no t daily]  . Drug use: No  . Sexual activity: Yes    Birth control/ protection: Post-menopausal   Other Topics Concern  . None   Social History  Narrative   HH of 2    Grandchild  Sons    reit[employer] Child Support   Married.    No  ets tobacco No  Exercise restriction   etoh 2 x per week.    Exercise good.       G3P2    Outpatient Medications Prior to Visit  Medication Sig Dispense Refill  . acetaminophen (TYLENOL) 650 MG CR tablet Take 650 mg by mouth every 8 (eight) hours as needed for pain.    Marland Kitchen aspirin 81 MG tablet Take 81 mg by mouth daily.      . calcium-vitamin D (OSCAL WITH D) 500-200 MG-UNIT per tablet Take 1 tablet by mouth daily.      Marland Kitchen estradiol (ESTRACE) 0.1 MG/GM vaginal cream Place 1 g vaginally 2 (two) times a week. Monday and Friday    . Multiple Vitamins-Minerals (ONE DAILY WOMENS 50+ PO) Take 1 tablet by mouth daily.    . Probiotic Product (PROBIOTIC DAILY PO) Take by mouth.    . cholecalciferol (VITAMIN D) 1000 UNITS tablet Take 1,000 Units by mouth daily.      Marland Kitchen escitalopram (LEXAPRO) 20 MG tablet Take 5 mg by mouth daily.    . mirabegron ER (MYRBETRIQ) 25 MG TB24 tablet Take 25 mg by mouth daily.    Marland Kitchen oxyCODONE (OXY IR/ROXICODONE) 5 MG immediate release tablet Take 1-2 tablets (5-10 mg total) by mouth every 6 (six) hours as needed for moderate pain, severe pain or breakthrough pain. (Patient not taking: Reported on 02/07/2015) 15 tablet 0   No facility-administered medications prior to visit.      EXAM:  BP 134/80 (BP Location: Right Arm, Patient Position: Sitting, Cuff Size: Normal)   Temp 98.1 F (36.7 C) (Oral)   Ht 5' 8" (1.727 m)   Wt 129 lb (58.5 kg)   BMI 19.61 kg/m   Body mass index is 19.61 kg/m.  Physical Exam: Vital signs reviewed QBH:ALPF is a well-developed well-nourished alert cooperative    who appearsr stated age in no acute distress.  HEENT: normocephalic atraumatic , Eyes: PERRL EOM's full, conjunctiva clear, Nares: paten,t no deformity discharge or tenderness., Ears: no deformity EAC's clear TMs with normal landmarks. Mouth: clear OP, no lesions, edema.  Moist mucous  membranes. Dentition in adequate repair. NECK: supple without masses, thyromegaly or bruits. CHEST/PULM:  Clear to auscultation and percussion breath sounds equal no  wheeze , rales or rhonchi. No chest wall deformities or tenderness. CV: PMI is nondisplaced, S1 S2 no gallops, murmurs, rubs. Peripheral pulses are full without delay.No JVD .  ABDOMEN: Bowel sounds normal nontender  No guard or rebound, no hepato splenomegal no CVA tenderness.  No hernia. Extremtities:  No clubbing cyanosis or edema, no acute joint swelling or redness no focal atrophy  Nopld vv  Color[place]  NEURO:  Oriented x3, cranial nerves 3-12 appear to be intact, no obvious focal weakness,gait within normal limits no abnormal reflexes or asymmetrical SKIN: No acute rashes normal turgor, color, no bruising or petechiae. PSYCH: Oriented, good eye contact, no obvious depression anxiety, cognition and judgment appear normal. LN: no cervical axillary inguinal adenopathy  Lab Results  Component Value Date   WBC 4.4 05/07/2016   HGB 14.3 05/07/2016   HCT 42.3 05/07/2016   PLT 211.0 05/07/2016   GLUCOSE 91 05/07/2016   CHOL 206 (H) 05/07/2016   TRIG 39.0 05/07/2016   HDL 74.90 05/07/2016   LDLDIRECT 128.8 02/23/2013   LDLCALC 123 (H) 05/07/2016   ALT 26 05/07/2016   AST 29 05/07/2016   NA 139 05/07/2016   K 4.1 05/07/2016   CL 103 05/07/2016   CREATININE 0.51 05/07/2016   BUN 15 05/07/2016   CO2 29 05/07/2016   TSH 1.58 05/07/2016   HGBA1C 5.3 02/26/2012   MICROALBUR 2.0 (H) 01/24/2009   Wt Readings from Last 3 Encounters:  05/14/16 129 lb (58.5 kg)  02/07/15 122 lb (55.3 kg)  01/30/15 122 lb 5 oz (55.5 kg)   BP Readings from Last 3 Encounters:  05/14/16 134/80  02/07/15 127/73  01/30/15 133/67    ASSESSMENT AND PLAN:  Discussed the following assessment and plan:  Visit for preventive health examination  Cough - nl exam  minor resp infection  sx rx fu with alarm sx or peresistence Vv  Stable   Okay to  refill the hydrocodone cough syrup with caution use at night as needed uncomplicated respiratory infection today. Patient Care Team: Burnis Medin, MD as PCP - General Brien Few, MD (Obstetrics and Gynecology) Mal Misty, MD (Thoracic Diseases) Ralene Bathe, MD as Consulting Physician (Ophthalmology) Levy Sjogren, MD as Referring Physician (Dermatology) Avon Gully, NP as Nurse Practitioner (Obstetrics and Gynecology) Patient Instructions  Continue lifestyle intervention healthy eating and exercise . Weight bearing for bone health .  Glad you are doing well  See you  in a year or if you need [place] .   Health Maintenance, Female Introduction Adopting a healthy lifestyle and getting preventive care can go a long way to promote health and wellness. Talk with your health care provider about what schedule of regular examinations is right for you. This is a good chance for you to check in with your provider about disease prevention and staying healthy. In between checkups, there are plenty of things you can do on your own. Experts have done a lot of research about which lifestyle changes and preventive measures are most likely to keep you healthy. Ask your health care provider for more information. Weight and diet Eat a healthy diet  Be sure to include plenty of vegetables, fruits, low-fat dairy products, and lean protein.  Do not eat a lot of foods high in solid fats, added sugars, or salt.  Get regular exercise. This is one of the most important things you can do for your health.  Most adults should exercise for at least 150 minutes each week. The  exercise should increase your heart rate and make you sweat (moderate-intensity exercise).  Most adults should also do strengthening exercises at least twice a week. This is in addition to the moderate-intensity exercise. Maintain a healthy weight  Body mass index (BMI) is a measurement that can be used to identify  possible weight problems. It estimates body fat based on height and weight. Your health care provider can help determine your BMI and help you achieve or maintain a healthy weight.  For females 44 years of age and older:  A BMI below 18.5 is considered underweight.  A BMI of 18.5 to 24.9 is normal.  A BMI of 25 to 29.9 is considered overweight.  A BMI of 30 and above is considered obese. Watch levels of cholesterol and blood lipids  You should start having your blood tested for lipids and cholesterol at 63 years of age, then have this test every 5 years.  You may need to have your cholesterol levels checked more often if:  Your lipid or cholesterol levels are high.  You are older than 63 years of age.  You are at high risk for heart disease. Cancer screening Lung Cancer  Lung cancer screening is recommended for adults 51-76 years old who are at high risk for lung cancer because of a history of smoking.  A yearly low-dose CT scan of the lungs is recommended for people who:  Currently smoke.  Have quit within the past 15 years.  Have at least a 30-pack-year history of smoking. A pack year is smoking an average of one pack of cigarettes a day for 1 year.  Yearly screening should continue until it has been 15 years since you quit.  Yearly screening should stop if you develop a health problem that would prevent you from having lung cancer treatment. Breast Cancer  Practice breast self-awareness. This means understanding how your breasts normally appear and feel.  It also means doing regular breast self-exams. Let your health care provider know about any changes, no matter how small.  If you are in your 20s or 30s, you should have a clinical breast exam (CBE) by a health care provider every 1-3 years as part of a regular health exam.  If you are 45 or older, have a CBE every year. Also consider having a breast X-ray (mammogram) every year.  If you have a family history of  breast cancer, talk to your health care provider about genetic screening.  If you are at high risk for breast cancer, talk to your health care provider about having an MRI and a mammogram every year.  Breast cancer gene (BRCA) assessment is recommended for women who have family members with BRCA-related cancers. BRCA-related cancers include:  Breast.  Ovarian.  Tubal.  Peritoneal cancers.  Results of the assessment will determine the need for genetic counseling and BRCA1 and BRCA2 testing. Cervical Cancer  Your health care provider may recommend that you be screened regularly for cancer of the pelvic organs (ovaries, uterus, and vagina). This screening involves a pelvic examination, including checking for microscopic changes to the surface of your cervix (Pap test). You may be encouraged to have this screening done every 3 years, beginning at age 6.  For women ages 31-65, health care providers may recommend pelvic exams and Pap testing every 3 years, or they may recommend the Pap and pelvic exam, combined with testing for human papilloma virus (HPV), every 5 years. Some types of HPV increase your risk of cervical cancer.  Testing for HPV may also be done on women of any age with unclear Pap test results.  Other health care providers may not recommend any screening for nonpregnant women who are considered low risk for pelvic cancer and who do not have symptoms. Ask your health care provider if a screening pelvic exam is right for you.  If you have had past treatment for cervical cancer or a condition that could lead to cancer, you need Pap tests and screening for cancer for at least 20 years after your treatment. If Pap tests have been discontinued, your risk factors (such as having a new sexual partner) need to be reassessed to determine if screening should resume. Some women have medical problems that increase the chance of getting cervical cancer. In these cases, your health care provider may  recommend more frequent screening and Pap tests. Colorectal Cancer  This type of cancer can be detected and often prevented.  Routine colorectal cancer screening usually begins at 63 years of age and continues through 63 years of age.  Your health care provider may recommend screening at an earlier age if you have risk factors for colon cancer.  Your health care provider may also recommend using home test kits to check for hidden blood in the stool.  A small camera at the end of a tube can be used to examine your colon directly (sigmoidoscopy or colonoscopy). This is done to check for the earliest forms of colorectal cancer.  Routine screening usually begins at age 13.  Direct examination of the colon should be repeated every 5-10 years through 63 years of age. However, you may need to be screened more often if early forms of precancerous polyps or small growths are found. Skin Cancer  Check your skin from head to toe regularly.  Tell your health care provider about any new moles or changes in moles, especially if there is a change in a mole's shape or color.  Also tell your health care provider if you have a mole that is larger than the size of a pencil eraser.  Always use sunscreen. Apply sunscreen liberally and repeatedly throughout the day.  Protect yourself by wearing long sleeves, pants, a wide-brimmed hat, and sunglasses whenever you are outside. Heart disease, diabetes, and high blood pressure  High blood pressure causes heart disease and increases the risk of stroke. High blood pressure is more likely to develop in:  People who have blood pressure in the high end of the normal range (130-139/85-89 mm Hg).  People who are overweight or obese.  People who are African American.  If you are 38-71 years of age, have your blood pressure checked every 3-5 years. If you are 26 years of age or older, have your blood pressure checked every year. You should have your blood pressure  measured twice-once when you are at a hospital or clinic, and once when you are not at a hospital or clinic. Record the average of the two measurements. To check your blood pressure when you are not at a hospital or clinic, you can use:  An automated blood pressure machine at a pharmacy.  A home blood pressure monitor.  If you are between 66 years and 51 years old, ask your health care provider if you should take aspirin to prevent strokes.  Have regular diabetes screenings. This involves taking a blood sample to check your fasting blood sugar level.  If you are at a normal weight and have a low risk for diabetes,  have this test once every three years after 63 years of age.  If you are overweight and have a high risk for diabetes, consider being tested at a younger age or more often. Preventing infection Hepatitis B  If you have a higher risk for hepatitis B, you should be screened for this virus. You are considered at high risk for hepatitis B if:  You were born in a country where hepatitis B is common. Ask your health care provider which countries are considered high risk.  Your parents were born in a high-risk country, and you have not been immunized against hepatitis B (hepatitis B vaccine).  You have HIV or AIDS.  You use needles to inject street drugs.  You live with someone who has hepatitis B.  You have had sex with someone who has hepatitis B.  You get hemodialysis treatment.  You take certain medicines for conditions, including cancer, organ transplantation, and autoimmune conditions. Hepatitis C  Blood testing is recommended for:  Everyone born from [birth year] through 1963.  Anyone with known risk factors for hepatitis C. Sexually transmitted infections (STIs)  You should be screened for sexually transmitted infections (STIs) including gonorrhea and chlamydia if:  You are sexually active and are younger than 63 years of age.  You are older than 63 years of age and  your health care provider tells you that you are at risk for this type of infection.  Your sexual activity has changed since you were last screened and you are at an increased risk for chlamydia or gonorrhea. Ask your health care provider if you are at risk.  If you do not have HIV, but are at risk, it may be recommended that you take a prescription medicine daily to prevent HIV infection. This is called pre-exposure prophylaxis (PrEP). You are considered at risk if:  You are sexually active and do not regularly use condoms or know the HIV status of your partner(s).  You take drugs by injection.  You are sexually active with a partner who has HIV. Talk with your health care provider about whether you are at high risk of being infected with HIV. If you choose to begin PrEP, you should first be tested for HIV. You should then be tested every 3 months for as long as you are taking PrEP. Pregnancy  If you are premenopausal and you may become pregnant, ask your health care provider about preconception counseling.  If you may become pregnant, take 400 to 800 micrograms (mcg) of folic acid every day.  If you want to prevent pregnancy, talk to your health care provider about birth control (contraception). Osteoporosis and menopause  Osteoporosis is a disease in which the bones lose minerals and strength with aging. This can result in serious bone fractures. Your risk for osteoporosis can be identified using a bone density scan.  If you are 104 years of age or older, or if you are at risk for osteoporosis and fractures, ask your health care provider if you should be screened.  Ask your health care provider whether you should take a calcium or vitamin D supplement to lower your risk for osteoporosis.  Menopause may have certain physical symptoms and risks.  Hormone replacement therapy may reduce some of these symptoms and risks. Talk to your health care provider about whether hormone replacement  therapy is right for you. Follow these instructions at home:  Schedule regular health, dental, and eye exams.  Stay current with your immunizations.  Do not use  any tobacco products including cigarettes, chewing tobacco, or electronic cigarettes.  If you are pregnant, do not drink alcohol.  If you are breastfeeding, limit how much and how often you drink alcohol.  Limit alcohol intake to no more than 1 drink per day for nonpregnant women. One drink equals 12 ounces of beer, 5 ounces of wine, or 1 ounces of hard liquor.  Do not use street drugs.  Do not share needles.  Ask your health care provider for help if you need support or information about quitting drugs.  Tell your health care provider if you often feel depressed.  Tell your health care provider if you have ever been abused or do not feel safe at home. This information is not intended to replace advice given to you by your health care provider. Make sure you discuss any questions you have with your health care provider. Document Released: 11/19/2010 Document Revised: 10/12/2015 Document Reviewed: 02/07/2015  2017 [employer]    Mariann Laster K. Panosh M.D.

## 2016-05-14 ENCOUNTER — Ambulatory Visit (INDEPENDENT_AMBULATORY_CARE_PROVIDER_SITE_OTHER): Payer: 59 | Admitting: Internal Medicine

## 2016-05-14 ENCOUNTER — Encounter: Payer: Self-pay | Admitting: Internal Medicine

## 2016-05-14 VITALS — BP 134/80 | Temp 98.1°F | Ht 68.0 in | Wt 129.0 lb

## 2016-05-14 DIAGNOSIS — Z0001 Encounter for general adult medical examination with abnormal findings: Secondary | ICD-10-CM

## 2016-05-14 DIAGNOSIS — Z0000 Encounter for general adult medical examination without abnormal findings: Secondary | ICD-10-CM

## 2016-05-14 DIAGNOSIS — R059 Cough, unspecified: Secondary | ICD-10-CM

## 2016-05-14 DIAGNOSIS — R05 Cough: Secondary | ICD-10-CM

## 2016-05-14 MED ORDER — HYDROCODONE-HOMATROPINE 5-1.5 MG/5ML PO SYRP: ORAL_SOLUTION | ORAL | 0 refills | Status: DC

## 2016-05-14 NOTE — Patient Instructions (Signed)
Continue lifestyle intervention healthy eating and exercise . Weight bearing for bone health .  Glad you are doing well  See you  in a year or if you need [place] .   Health Maintenance, Female Introduction Adopting a healthy lifestyle and getting preventive care can go a long way to promote health and wellness. Talk with your health care provider about what schedule of regular examinations is right for you. This is a good chance for you to check in with your provider about disease prevention and staying healthy. In between checkups, there are plenty of things you can do on your own. Experts have done a lot of research about which lifestyle changes and preventive measures are most likely to keep you healthy. Ask your health care provider for more information. Weight and diet Eat a healthy diet  Be sure to include plenty of vegetables, fruits, low-fat dairy products, and lean protein.  Do not eat a lot of foods high in solid fats, added sugars, or salt.  Get regular exercise. This is one of the most important things you can do for your health.  Most adults should exercise for at least 150 minutes each week. The exercise should increase your heart rate and make you sweat (moderate-intensity exercise).  Most adults should also do strengthening exercises at least twice a week. This is in addition to the moderate-intensity exercise. Maintain a healthy weight  Body mass index (BMI) is a measurement that can be used to identify possible weight problems. It estimates body fat based on height and weight. Your health care provider can help determine your BMI and help you achieve or maintain a healthy weight.  For females 18 years of age and older:  A BMI below 18.5 is considered underweight.  A BMI of 18.5 to 24.9 is normal.  A BMI of 25 to 29.9 is considered overweight.  A BMI of 30 and above is considered obese. Watch levels of cholesterol and blood lipids  You should start having your blood  tested for lipids and cholesterol at 63 years of age, then have this test every 5 years.  You may need to have your cholesterol levels checked more often if:  Your lipid or cholesterol levels are high.  You are older than 63 years of age.  You are at high risk for heart disease. Cancer screening Lung Cancer  Lung cancer screening is recommended for adults 55-36 years old who are at high risk for lung cancer because of a history of smoking.  A yearly low-dose CT scan of the lungs is recommended for people who:  Currently smoke.  Have quit within the past 15 years.  Have at least a 30-pack-year history of smoking. A pack year is smoking an average of one pack of cigarettes a day for 1 year.  Yearly screening should continue until it has been 15 years since you quit.  Yearly screening should stop if you develop a health problem that would prevent you from having lung cancer treatment. Breast Cancer  Practice breast self-awareness. This means understanding how your breasts normally appear and feel.  It also means doing regular breast self-exams. Let your health care provider know about any changes, no matter how small.  If you are in your 20s or 30s, you should have a clinical breast exam (CBE) by a health care provider every 1-3 years as part of a regular health exam.  If you are 37 or older, have a CBE every year. Also consider having  a breast X-ray (mammogram) every year.  If you have a family history of breast cancer, talk to your health care provider about genetic screening.  If you are at high risk for breast cancer, talk to your health care provider about having an MRI and a mammogram every year.  Breast cancer gene (BRCA) assessment is recommended for women who have family members with BRCA-related cancers. BRCA-related cancers include:  Breast.  Ovarian.  Tubal.  Peritoneal cancers.  Results of the assessment will determine the need for genetic counseling and  BRCA1 and BRCA2 testing. Cervical Cancer  Your health care provider may recommend that you be screened regularly for cancer of the pelvic organs (ovaries, uterus, and vagina). This screening involves a pelvic examination, including checking for microscopic changes to the surface of your cervix (Pap test). You may be encouraged to have this screening done every 3 years, beginning at age 61.  For women ages 44-65, health care providers may recommend pelvic exams and Pap testing every 3 years, or they may recommend the Pap and pelvic exam, combined with testing for human papilloma virus (HPV), every 5 years. Some types of HPV increase your risk of cervical cancer. Testing for HPV may also be done on women of any age with unclear Pap test results.  Other health care providers may not recommend any screening for nonpregnant women who are considered low risk for pelvic cancer and who do not have symptoms. Ask your health care provider if a screening pelvic exam is right for you.  If you have had past treatment for cervical cancer or a condition that could lead to cancer, you need Pap tests and screening for cancer for at least 20 years after your treatment. If Pap tests have been discontinued, your risk factors (such as having a new sexual partner) need to be reassessed to determine if screening should resume. Some women have medical problems that increase the chance of getting cervical cancer. In these cases, your health care provider may recommend more frequent screening and Pap tests. Colorectal Cancer  This type of cancer can be detected and often prevented.  Routine colorectal cancer screening usually begins at 63 years of age and continues through 63 years of age.  Your health care provider may recommend screening at an earlier age if you have risk factors for colon cancer.  Your health care provider may also recommend using home test kits to check for hidden blood in the stool.  A small camera at  the end of a tube can be used to examine your colon directly (sigmoidoscopy or colonoscopy). This is done to check for the earliest forms of colorectal cancer.  Routine screening usually begins at age 65.  Direct examination of the colon should be repeated every 5-10 years through 63 years of age. However, you may need to be screened more often if early forms of precancerous polyps or small growths are found. Skin Cancer  Check your skin from head to toe regularly.  Tell your health care provider about any new moles or changes in moles, especially if there is a change in a mole's shape or color.  Also tell your health care provider if you have a mole that is larger than the size of a pencil eraser.  Always use sunscreen. Apply sunscreen liberally and repeatedly throughout the day.  Protect yourself by wearing long sleeves, pants, a wide-brimmed hat, and sunglasses whenever you are outside. Heart disease, diabetes, and high blood pressure  High blood pressure  causes heart disease and increases the risk of stroke. High blood pressure is more likely to develop in:  People who have blood pressure in the high end of the normal range (130-139/85-89 mm Hg).  People who are overweight or obese.  People who are African American.  If you are 75-45 years of age, have your blood pressure checked every 3-5 years. If you are 66 years of age or older, have your blood pressure checked every year. You should have your blood pressure measured twice-once when you are at a hospital or clinic, and once when you are not at a hospital or clinic. Record the average of the two measurements. To check your blood pressure when you are not at a hospital or clinic, you can use:  An automated blood pressure machine at a pharmacy.  A home blood pressure monitor.  If you are between 37 years and 100 years old, ask your health care provider if you should take aspirin to prevent strokes.  Have regular diabetes  screenings. This involves taking a blood sample to check your fasting blood sugar level.  If you are at a normal weight and have a low risk for diabetes, have this test once every three years after 63 years of age.  If you are overweight and have a high risk for diabetes, consider being tested at a younger age or more often. Preventing infection Hepatitis B  If you have a higher risk for hepatitis B, you should be screened for this virus. You are considered at high risk for hepatitis B if:  You were born in a country where hepatitis B is common. Ask your health care provider which countries are considered high risk.  Your parents were born in a high-risk country, and you have not been immunized against hepatitis B (hepatitis B vaccine).  You have HIV or AIDS.  You use needles to inject street drugs.  You live with someone who has hepatitis B.  You have had sex with someone who has hepatitis B.  You get hemodialysis treatment.  You take certain medicines for conditions, including cancer, organ transplantation, and autoimmune conditions. Hepatitis C  Blood testing is recommended for:  Everyone born from [birth year] through 1963.  Anyone with known risk factors for hepatitis C. Sexually transmitted infections (STIs)  You should be screened for sexually transmitted infections (STIs) including gonorrhea and chlamydia if:  You are sexually active and are younger than 63 years of age.  You are older than 63 years of age and your health care provider tells you that you are at risk for this type of infection.  Your sexual activity has changed since you were last screened and you are at an increased risk for chlamydia or gonorrhea. Ask your health care provider if you are at risk.  If you do not have HIV, but are at risk, it may be recommended that you take a prescription medicine daily to prevent HIV infection. This is called pre-exposure prophylaxis (PrEP). You are considered at risk  if:  You are sexually active and do not regularly use condoms or know the HIV status of your partner(s).  You take drugs by injection.  You are sexually active with a partner who has HIV. Talk with your health care provider about whether you are at high risk of being infected with HIV. If you choose to begin PrEP, you should first be tested for HIV. You should then be tested every 3 months for as long as you are  taking PrEP. Pregnancy  If you are premenopausal and you may become pregnant, ask your health care provider about preconception counseling.  If you may become pregnant, take 400 to 800 micrograms (mcg) of folic acid every day.  If you want to prevent pregnancy, talk to your health care provider about birth control (contraception). Osteoporosis and menopause  Osteoporosis is a disease in which the bones lose minerals and strength with aging. This can result in serious bone fractures. Your risk for osteoporosis can be identified using a bone density scan.  If you are 25 years of age or older, or if you are at risk for osteoporosis and fractures, ask your health care provider if you should be screened.  Ask your health care provider whether you should take a calcium or vitamin D supplement to lower your risk for osteoporosis.  Menopause may have certain physical symptoms and risks.  Hormone replacement therapy may reduce some of these symptoms and risks. Talk to your health care provider about whether hormone replacement therapy is right for you. Follow these instructions at home:  Schedule regular health, dental, and eye exams.  Stay current with your immunizations.  Do not use any tobacco products including cigarettes, chewing tobacco, or electronic cigarettes.  If you are pregnant, do not drink alcohol.  If you are breastfeeding, limit how much and how often you drink alcohol.  Limit alcohol intake to no more than 1 drink per day for nonpregnant women. One drink equals  12 ounces of beer, 5 ounces of wine, or 1 ounces of hard liquor.  Do not use street drugs.  Do not share needles.  Ask your health care provider for help if you need support or information about quitting drugs.  Tell your health care provider if you often feel depressed.  Tell your health care provider if you have ever been abused or do not feel safe at home. This information is not intended to replace advice given to you by your health care provider. Make sure you discuss any questions you have with your health care provider. Document Released: 11/19/2010 Document Revised: 10/12/2015 Document Reviewed: 02/07/2015  2017 [employer]

## 2016-08-26 ENCOUNTER — Encounter: Payer: Self-pay | Admitting: *Deleted

## 2016-09-04 ENCOUNTER — Ambulatory Visit (INDEPENDENT_AMBULATORY_CARE_PROVIDER_SITE_OTHER): Payer: Self-pay | Admitting: *Deleted

## 2016-09-04 DIAGNOSIS — I8393 Asymptomatic varicose veins of bilateral lower extremities: Secondary | ICD-10-CM

## 2016-09-04 NOTE — Progress Notes (Signed)
X=.3% Sotradecol administered with a 27g butterfly.  Patient received a total of 12cc.  Treated ares from mid thigh down only on the front. She has so many vessels. Impossible to get everything. Hoping for results she is pleased with. She is realistic. Will follow this sweet lady prn.    Compression stockings applied: Yes.

## 2016-09-11 ENCOUNTER — Encounter: Payer: Self-pay | Admitting: *Deleted

## 2017-01-30 ENCOUNTER — Encounter: Payer: Self-pay | Admitting: Internal Medicine

## 2017-01-30 LAB — HM MAMMOGRAPHY

## 2017-01-31 ENCOUNTER — Encounter: Payer: Self-pay | Admitting: Internal Medicine

## 2017-05-12 ENCOUNTER — Other Ambulatory Visit: Payer: 59

## 2017-05-16 ENCOUNTER — Encounter: Payer: 59 | Admitting: Internal Medicine

## 2017-07-23 ENCOUNTER — Ambulatory Visit (INDEPENDENT_AMBULATORY_CARE_PROVIDER_SITE_OTHER): Payer: Self-pay | Admitting: *Deleted

## 2017-07-23 DIAGNOSIS — I8393 Asymptomatic varicose veins of bilateral lower extremities: Secondary | ICD-10-CM

## 2017-07-23 NOTE — Progress Notes (Signed)
X=.3% Sotradecol administered with a 27g butterfly.  Patient received a total of 12cc.  Cleaned up blue retics and scattered spiders. Lots of red caps which I could not treat (too many). Tol proced well. Anticipate good results. She knows the left foot will be hard to close. Follow prn.  Photos: Yes.    Compression stockings applied: Yes.

## 2017-07-29 ENCOUNTER — Encounter: Payer: Self-pay | Admitting: Vascular Surgery

## 2017-08-05 NOTE — Progress Notes (Signed)
Chief Complaint  Patient presents with  . Annual Exam    No new concerns. Discuss getting Shingrix vaccine.     HPI: Patient  Linda Richard  67 y.o. comes in today for [place] visit  Doing well no new sx  Sees gyne  Yearly no problems  complexcyst on r breast .  Following.  Mammogram  utd  Donates blood   No hx of hepatitis  Health Maintenance  Topic Date Due  . Hepatitis C Screening  1950-08-07  . HIV Screening  09/25/1967  . PAP SMEAR  10/18/2017  . MAMMOGRAM  01/31/2019  . COLONOSCOPY  09/29/2024  . TETANUS/TDAP  04/03/2025  . INFLUENZA VACCINE  Completed   Health Maintenance Review LIFESTYLE:  Exercise:  Walk 4 miles per per days and  Stretching     Tobacco/ETS:  no Alcohol:   1 per week  Sugar beverages: no reg  Sleep: 8 hours  Drug use: no HH of   2 none  Pets  Retired  Has grand kids    ROS:  GEN/ HEENT: No fever, significant weight changes sweats headaches vision problems hearing changes, CV/ PULM; No chest pain shortness of breath cough, syncope,edema  change in exercise tolerance. GI /GU: No adominal pain, vomiting, change in bowel habits. No blood in the stool. No significant GU symptoms. SKIN/HEME: ,no acute skin rashes suspicious lesions or bleeding. No lymphadenopathy, nodules, masses.  NEURO/ PSYCH:  No neurologic signs such as weakness numbness. No depression anxiety. IMM/ Allergy: No unusual infections.  Allergy .   REST of 12 system review negative except as per HPI   Past Medical History:  Diagnosis Date  . Anxiety    takes Lexapro daily-just started 01/19/15  . Diverticulitis of intestine with perforation 10/06/2014  . DVT (deep vein thrombosis) in pregnancy ([place]) 1983   behind left leg  . History of kidney stones 2011  . History of shingles   . Joint pain   . 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 Pneumonia as a child  . Urinary frequency   . Urinary urgency    just started  taking Myrbetriq  . Varicose veins    strip and laser    Past Surgical History:  Procedure Laterality Date  . CESAREAN SECTION  1983  . CHOLECYSTECTOMY N/A 01/30/2015   Procedure: LAPAROSCOPIC CHOLECYSTECTOMY;  Surgeon: Rolm Bookbinder, MD;  Location: [facility];  Service: General;  Laterality: N/A;  . COLONOSCOPY    . TONSILLECTOMY  as a child  . veins stripped  80's   laser RX     Family History  Problem Relation Age of Onset  . Hypertension Unknown   . Kidney disease Unknown        renal stones  . Arthritis Unknown   . Cancer Father        bladder   36s   . Ovarian cancer Mother        1s  . Other Unknown        son age 43 irreg HB  had cardioversion   . Thyroid disease Unknown   . Colon polyps Neg Hx   . Colon cancer Neg Hx   . Rectal cancer Neg Hx   . Stomach cancer Neg Hx     Social History   Socioeconomic History  . Marital status: Married    Spouse name: None  . Number of children: None  . Years  of education: None  . Highest education level: None  Social Needs  . Financial resource strain: None  . Food insecurity - worry: None  . Food insecurity - inability: None  . Transportation needs - medical: None  . Transportation needs - non-medical: None  Occupational History  . None  Tobacco Use  . Smoking status: Never Smoker  . Smokeless tobacco: Never Used  Substance and Sexual Activity  . Alcohol use: Yes    Alcohol/week: 0.0 oz    Comment: 3 x a week, mostly wine no t daily]  . Drug use: No  . Sexual activity: Yes    Birth control/protection: Post-menopausal  Other Topics Concern  . None  Social History Narrative   HH of 2    Grandchild  Sons    re[employer] Child Support   Married.    No  ets tobacco No  Exercise restriction   etoh 2 x per week.    Exercise good.       G3P2    Outpatient Medications Prior to Visit  Medication Sig Dispense Refill  . Acetaminophen (TYLENOL ARTHRITIS PAIN PO) Take 800 mg by mouth daily as needed.    Marland Kitchen  aspirin 81 MG tablet Take 81 mg by mouth daily.      . busPIRone (BUSPAR) 7.5 MG tablet Take 7.5 mg twice daily    . calcium-vitamin D (OSCAL WITH D) 500-200 MG-UNIT per tablet Take 1 tablet by mouth daily.      Marland Kitchen escitalopram (LEXAPRO) 10 MG tablet Take 10 mg orally daily    . estradiol (ESTRACE) 0.1 MG/GM vaginal cream Place 1 g vaginally 2 (two) times a week. Monday and Friday    . Multiple Vitamins-Minerals (ONE DAILY WOMENS 50+ PO) Take 1 tablet by mouth daily.    . Probiotic Product (PROBIOTIC DAILY PO) Take by mouth.    . tretinoin (RETIN-A) 0.025 % cream as needed.  99  . acetaminophen (TYLENOL) 650 MG CR tablet Take 650 mg by mouth every 8 (eight) hours as needed for pain.    Marland Kitchen HYDROcodone-homatropine (HYCODAN) 5-1.5 MG/5ML syrup 1 tsp at night or every 4-6 hours if needed for cough (Patient not taking: Reported on 08/06/2017) 120 mL 0   No facility-administered medications prior to visit.      EXAM:  BP 124/84 (BP Location: Right Arm, Patient Position: Sitting, Cuff Size: Normal)   Pulse 70   Temp 98.1 F (36.7 C) (Oral)   Wt 129 lb 3.2 oz (58.6 kg)   BMI 19.64 kg/m   Body mass index is 19.64 kg/m. Wt Readings from Last 3 Encounters:  08/06/17 129 lb 3.2 oz (58.6 kg)  05/14/16 129 lb (58.5 kg)  02/07/15 122 lb (55.3 kg)    Physical Exam: Vital signs reviewed HCW:CBJS is a well-developed well-nourished alert cooperative    who appearsr stated age in no acute distress.  HEENT: normocephalic atraumatic , Eyes: PERRL EOM's full, conjunctiva clear, Nares: paten,t no deformity discharge or tenderness., Ears: no deformity EAC's clear TMs with normal landmarks. Mouth: clear OP, no lesions, edema.  Moist mucous membranes. Dentition in adequate repair. NECK: supple without masses, thyromegaly or bruits. CHEST/PULM:  Clear to auscultation and percussion breath sounds equal no wheeze , rales or rhonchi. No chest wall deformities or tenderness. Breast: normal by inspection . No  dimpling, discharge, masses, tenderness or discharge . CV: PMI is nondisplaced, S1 S2 no gallops, murmurs, rubs. Peripheral pulses are full without delay.No JVD .  ABDOMEN:  Bowel sounds normal nontender  No guard or rebound, no hepato splenomegal no CVA tenderness.  No hernia. Extremtities:  No clubbing cyanosis or edema, no acute joint swelling or redness no focal atrophy  Vv  No ulcers  Or lesions  NEURO:  Oriented x3, cranial nerves 3-12 appear to be intact, no obvious focal weakness,gait within normal limits no abnormal reflexes or asymmetrical SKIN: No acute rashes normal turgor, color, no bruising or petechiae. PSYCH: Oriented, good eye contact, no obvious depression anxiety, cognition and judgment appear normal. LN: no cervical axillary inguinal adenopathy  Lab Results  Component Value Date   WBC 4.4 05/07/2016   HGB 14.3 05/07/2016   HCT 42.3 05/07/2016   PLT 211.0 05/07/2016   GLUCOSE 91 05/07/2016   CHOL 206 (H) 05/07/2016   TRIG 39.0 05/07/2016   HDL 74.90 05/07/2016   LDLDIRECT 128.8 02/23/2013   LDLCALC 123 (H) 05/07/2016   ALT 26 05/07/2016   AST 29 05/07/2016   NA 139 05/07/2016   K 4.1 05/07/2016   CL 103 05/07/2016   CREATININE 0.51 05/07/2016   BUN 15 05/07/2016   CO2 29 05/07/2016   TSH 1.58 05/07/2016   HGBA1C 5.3 02/26/2012   MICROALBUR 2.0 (H) 01/24/2009    BP Readings from Last 3 Encounters:  08/06/17 124/84  05/14/16 134/80  02/07/15 127/73   Wt Readings from Last 3 Encounters:  08/06/17 129 lb 3.2 oz (58.6 kg)  05/14/16 129 lb (58.5 kg)  02/07/15 122 lb (55.3 kg)     ASSESSMENT AND PLAN:  Discussed the following assessment and plan:  Visit for preventive health examination - Plan: Basic metabolic panel, CBC with Differential/Platelet, Hepatic function panel, Lipid panel, TSH  Hyperlipidemia, unspecified hyperlipidemia type - Plan: Basic metabolic panel, CBC with Differential/Platelet, Hepatic function panel, Lipid panel, TSH  Osteopenia,  unspecified location - Plan: Basic metabolic panel, CBC with Differential/Platelet, Hepatic function panel, Lipid panel, TSH  History of renal stone - Plan: Basic metabolic panel, CBC with Differential/Platelet, Hepatic function panel, Lipid panel, TSH Continue lifestyle intervention healthy eating and exercise . Counseled.   Will contact about lab results  Welcome to medicare next year  Patient Care Team: Panosh, Standley Brooking, MD as PCP - General Brien Few, MD (Obstetrics and Gynecology) Mal Misty, MD (Thoracic Diseases) Ralene Bathe, MD as Consulting Physician (Ophthalmology) Levy Sjogren, MD as Referring Physician (Dermatology) Avon Gully, NP as Nurse Practitioner (Obstetrics and Gynecology) Patient Instructions  Check into shingles vaccine. Shingrix ator pharmacy depending on insurance rules. Medicare at pharmacy    Will notify you  of labs when available. Continue lifestyle intervention healthy eating and exercise .   Health Maintenance, Female Adopting a healthy lifestyle and getting preventive care can go a long way to promote health and wellness. Talk with your health care provider about what schedule of regular examinations is right for you. This is a good chance for you to check in with your provider about disease prevention and staying healthy. In between checkups, there are plenty of things you can do on your own. Experts have done a lot of research about which lifestyle changes and preventive measures are most likely to keep you healthy. Ask your health care provider for more information. Weight and diet Eat a healthy diet  Be sure to include plenty of vegetables, fruits, low-fat dairy products, and lean protein.  Do not eat a lot of foods high in solid fats, added sugars, or salt.  Get regular exercise. This is  one of the most important things you can do for your health. ? Most adults should exercise for at least 150 minutes each week. The  exercise should increase your heart rate and make you sweat (moderate-intensity exercise). ? Most adults should also do strengthening exercises at least twice a week. This is in addition to the moderate-intensity exercise.  Maintain a healthy weight  Body mass index (BMI) is a measurement that can be used to identify possible weight problems. It estimates body fat based on height and weight. Your health care provider can help determine your BMI and help you achieve or maintain a healthy weight.  For females 56 years of age and older: ? A BMI below 18.5 is considered underweight. ? A BMI of 18.5 to 24.9 is normal. ? A BMI of 25 to 29.9 is considered overweight. ? A BMI of 30 and above is considered obese.  Watch levels of cholesterol and blood lipids  You should start having your blood tested for lipids and cholesterol at 65 years of age, then have this test every 5 years.  You may need to have your cholesterol levels checked more often if: ? Your lipid or cholesterol levels are high. ? You are older than 65 years of age. ? You are at high risk for heart disease.  Cancer screening Lung Cancer  Lung cancer screening is recommended for adults 3-106 years old who are at high risk for lung cancer because of a history of smoking.  A yearly low-dose CT scan of the lungs is recommended for people who: ? Currently smoke. ? Have quit within the past 15 years. ? Have at least a 30-pack-year history of smoking. A pack year is smoking an average of one pack of cigarettes a day for 1 year.  Yearly screening should continue until it has been 15 years since you quit.  Yearly screening should stop if you develop a health problem that would prevent you from having lung cancer treatment.  Breast Cancer  Practice breast self-awareness. This means understanding how your breasts normally appear and feel.  It also means doing regular breast self-exams. Let your health care provider know about any  changes, no matter how small.  If you are in your 20s or 30s, you should have a clinical breast exam (CBE) by a health care provider every 1-3 years as part of a regular health exam.  If you are 33 or older, have a CBE every year. Also consider having a breast X-ray (mammogram) every year.  If you have a family history of breast cancer, talk to your health care provider about genetic screening.  If you are at high risk for breast cancer, talk to your health care provider about having an MRI and a mammogram every year.  Breast cancer gene (BRCA) assessment is recommended for women who have family members with BRCA-related cancers. BRCA-related cancers include: ? Breast. ? Ovarian. ? Tubal. ? Peritoneal cancers.  Results of the assessment will determine the need for genetic counseling and BRCA1 and BRCA2 testing.  Cervical Cancer Your health care provider may recommend that you be screened regularly for cancer of the pelvic organs (ovaries, uterus, and vagina). This screening involves a pelvic examination, including checking for microscopic changes to the surface of your cervix (Pap test). You may be encouraged to have this screening done every 3 years, beginning at age 70.  For women ages 36-65, health care providers may recommend pelvic exams and Pap testing every 3 years, or  they may recommend the Pap and pelvic exam, combined with testing for human papilloma virus (HPV), every 5 years. Some types of HPV increase your risk of cervical cancer. Testing for HPV may also be done on women of any age with unclear Pap test results.  Other health care providers may not recommend any screening for nonpregnant women who are considered low risk for pelvic cancer and who do not have symptoms. Ask your health care provider if a screening pelvic exam is right for you.  If you have had past treatment for cervical cancer or a condition that could lead to cancer, you need Pap tests and screening for cancer  for at least 20 years after your treatment. If Pap tests have been discontinued, your risk factors (such as having a new sexual partner) need to be reassessed to determine if screening should resume. Some women have medical problems that increase the chance of getting cervical cancer. In these cases, your health care provider may recommend more frequent screening and Pap tests.  Colorectal Cancer  This type of cancer can be detected and often prevented.  Routine colorectal cancer screening usually begins at 65 years of age and continues through 65 years of age.  Your health care provider may recommend screening at an earlier age if you have risk factors for colon cancer.  Your health care provider may also recommend using home test kits to check for hidden blood in the stool.  A small camera at the end of a tube can be used to examine your colon directly (sigmoidoscopy or colonoscopy). This is done to check for the earliest forms of colorectal cancer.  Routine screening usually begins at age 73.  Direct examination of the colon should be repeated every 5-10 years through 65 years of age. However, you may need to be screened more often if early forms of precancerous polyps or small growths are found.  Skin Cancer  Check your skin from head to toe regularly.  Tell your health care provider about any new moles or changes in moles, especially if there is a change in a mole's shape or color.  Also tell your health care provider if you have a mole that is larger than the size of a pencil eraser.  Always use sunscreen. Apply sunscreen liberally and repeatedly throughout the day.  Protect yourself by wearing long sleeves, pants, a wide-brimmed hat, and sunglasses whenever you are outside.  Heart disease, diabetes, and high blood pressure  High blood pressure causes heart disease and increases the risk of stroke. High blood pressure is more likely to develop in: ? People who have blood  pressure in the high end of the normal range (130-139/85-89 mm Hg). ? People who are overweight or obese. ? People who are African American.  If you are 37-76 years of age, have your blood pressure checked every 3-5 years. If you are 69 years of age or older, have your blood pressure checked every year. You should have your blood pressure measured twice-once when you are at a hospital or clinic, and once when you are not at a hospital or clinic. Record the average of the two measurements. To check your blood pressure when you are not at a hospital or clinic, you can use: ? An automated blood pressure machine at a pharmacy. ? A home blood pressure monitor.  If you are between 32 years and 13 years old, ask your health care provider if you should take aspirin to prevent strokes.  Have regular diabetes screenings. This involves taking a blood sample to check your fasting blood sugar level. ? If you are at a normal weight and have a low risk for diabetes, have this test once every three years after 65 years of age. ? If you are overweight and have a high risk for diabetes, consider being tested at a younger age or more often. Preventing infection Hepatitis B  If you have a higher risk for hepatitis B, you should be screened for this virus. You are considered at high risk for hepatitis B if: ? You were born in a country where hepatitis B is common. Ask your health care provider which countries are considered high risk. ? Your parents were born in a high-risk country, and you have not been immunized against hepatitis B (hepatitis B vaccine). ? You have HIV or AIDS. ? You use needles to inject street drugs. ? You live with someone who has hepatitis B. ? You have had sex with someone who has hepatitis B. ? You get hemodialysis treatment. ? You take certain medicines for conditions, including cancer, organ transplantation, and autoimmune conditions.  Hepatitis C  Blood testing is recommended  for: ? Everyone born from [birth year] through 1963. ? Anyone with known risk factors for hepatitis C.  Sexually transmitted infections (STIs)  You should be screened for sexually transmitted infections (STIs) including gonorrhea and chlamydia if: ? You are sexually active and are younger than 65 years of age. ? You are older than 65 years of age and your health care provider tells you that you are at risk for this type of infection. ? Your sexual activity has changed since you were last screened and you are at an increased risk for chlamydia or gonorrhea. Ask your health care provider if you are at risk.  If you do not have HIV, but are at risk, it may be recommended that you take a prescription medicine daily to prevent HIV infection. This is called pre-exposure prophylaxis (PrEP). You are considered at risk if: ? You are sexually active and do not regularly use condoms or know the HIV status of your partner(s). ? You take drugs by injection. ? You are sexually active with a partner who has HIV.  Talk with your health care provider about whether you are at high risk of being infected with HIV. If you choose to begin PrEP, you should first be tested for HIV. You should then be tested every 3 months for as long as you are taking PrEP. Pregnancy  If you are premenopausal and you may become pregnant, ask your health care provider about preconception counseling.  If you may become pregnant, take 400 to 800 micrograms (mcg) of folic acid every day.  If you want to prevent pregnancy, talk to your health care provider about birth control (contraception). Osteoporosis and menopause  Osteoporosis is a disease in which the bones lose minerals and strength with aging. This can result in serious bone fractures. Your risk for osteoporosis can be identified using a bone density scan.  If you are 46 years of age or older, or if you are at risk for osteoporosis and fractures, ask your health care provider if  you should be screened.  Ask your health care provider whether you should take a calcium or vitamin D supplement to lower your risk for osteoporosis.  Menopause may have certain physical symptoms and risks.  Hormone replacement therapy may reduce some of these symptoms and risks. Talk to your  health care provider about whether hormone replacement therapy is right for you. Follow these instructions at home:  Schedule regular health, dental, and eye exams.  Stay current with your immunizations.  Do not use any tobacco products including cigarettes, chewing tobacco, or electronic cigarettes.  If you are pregnant, do not drink alcohol.  If you are breastfeeding, limit how much and how often you drink alcohol.  Limit alcohol intake to no more than 1 drink per day for nonpregnant women. One drink equals 12 ounces of beer, 5 ounces of wine, or 1 ounces of hard liquor.  Do not use street drugs.  Do not share needles.  Ask your health care provider for help if you need support or information about quitting drugs.  Tell your health care provider if you often feel depressed.  Tell your health care provider if you have ever been abused or do not feel safe at home. This information is not intended to replace advice given to you by your health care provider. Make sure you discuss any questions you have with your health care provider. Document Released: 11/19/2010 Document Revised: 10/12/2015 Document Reviewed: 02/07/2015 Elsevier Interactive Patient Education  2018 [place]. Panosh M.D.

## 2017-08-06 ENCOUNTER — Encounter: Payer: Self-pay | Admitting: Internal Medicine

## 2017-08-06 ENCOUNTER — Ambulatory Visit (INDEPENDENT_AMBULATORY_CARE_PROVIDER_SITE_OTHER): Payer: 59 | Admitting: Internal Medicine

## 2017-08-06 VITALS — BP 124/84 | HR 70 | Temp 98.1°F | Wt 129.2 lb

## 2017-08-06 DIAGNOSIS — E785 Hyperlipidemia, unspecified: Secondary | ICD-10-CM

## 2017-08-06 DIAGNOSIS — M8580 Other specified disorders of bone density and structure, unspecified site: Secondary | ICD-10-CM | POA: Insufficient documentation

## 2017-08-06 DIAGNOSIS — Z87442 Personal history of urinary calculi: Secondary | ICD-10-CM | POA: Diagnosis not present

## 2017-08-06 DIAGNOSIS — Z0000 Encounter for general adult medical examination without abnormal findings: Secondary | ICD-10-CM

## 2017-08-06 LAB — HEPATIC FUNCTION PANEL
ALT: 19 U/L (ref 0–35)
AST: 22 U/L (ref 0–37)
Albumin: 4.5 g/dL (ref 3.5–5.2)
Alkaline Phosphatase: 48 U/L (ref 39–117)
BILIRUBIN TOTAL: 0.6 mg/dL (ref 0.2–1.2)
Bilirubin, Direct: 0.2 mg/dL (ref 0.0–0.3)
Total Protein: 6.9 g/dL (ref 6.0–8.3)

## 2017-08-06 LAB — LIPID PANEL
CHOL/HDL RATIO: 2
Cholesterol: 237 mg/dL — ABNORMAL HIGH (ref 0–200)
HDL: 96.3 mg/dL (ref >39.00)
LDL CALC: 127 mg/dL — AB (ref 0–99)
NonHDL: 140.25
TRIGLYCERIDES: 66 mg/dL (ref 0.0–149.0)
VLDL: 13.2 mg/dL (ref 0.0–40.0)

## 2017-08-06 LAB — BASIC METABOLIC PANEL
BUN: 14 mg/dL (ref 6–23)
CHLORIDE: 103 meq/L (ref 96–112)
CO2: 28 meq/L (ref 19–32)
CREATININE: 0.43 mg/dL (ref 0.40–1.20)
Calcium: 9.8 mg/dL (ref 8.4–10.5)
GFR: 156.69 mL/min (ref >60.00)
Glucose, Bld: 102 mg/dL — ABNORMAL HIGH (ref 70–99)
Potassium: 4.3 mEq/L (ref 3.5–5.1)
Sodium: 143 mEq/L (ref 135–145)

## 2017-08-06 LAB — CBC WITH DIFFERENTIAL/PLATELET
BASOS PCT: 0.5 % (ref 0.0–3.0)
Basophils Absolute: 0 10*3/uL (ref 0.0–0.1)
EOS ABS: 0.1 10*3/uL (ref 0.0–0.7)
Eosinophils Relative: 2.3 % (ref 0.0–5.0)
HCT: 43.4 % (ref 36.0–46.0)
Hemoglobin: 15 g/dL (ref 12.0–15.0)
Lymphocytes Relative: 28.8 % (ref 12.0–46.0)
Lymphs Abs: 1.5 10*3/uL (ref 0.7–4.0)
MCHC: 34.6 g/dL (ref 30.0–36.0)
MCV: 91 fl (ref 78.0–100.0)
MONO ABS: 0.4 10*3/uL (ref 0.1–1.0)
Monocytes Relative: 8 % (ref 3.0–12.0)
NEUTROS ABS: 3.2 10*3/uL (ref 1.4–7.7)
NEUTROS PCT: 60.4 % (ref 43.0–77.0)
PLATELETS: 188 10*3/uL (ref 150.0–400.0)
RBC: 4.77 Mil/uL (ref 3.87–5.11)
RDW: 12.9 % (ref 11.5–15.5)
WBC: 5.4 10*3/uL (ref 4.0–10.5)

## 2017-08-06 LAB — TSH: TSH: 1.19 u[IU]/mL (ref 0.35–4.50)

## 2017-08-06 NOTE — Patient Instructions (Signed)
Check into shingles vaccine. Shingrix ator pharmacy depending on insurance rules. Medicare at pharmacy    Will notify you  of labs when available. Continue lifestyle intervention healthy eating and exercise .   Health Maintenance, Female Adopting a healthy lifestyle and getting preventive care can go a long way to promote health and wellness. Talk with your health care provider about what schedule of regular examinations is right for you. This is a good chance for you to check in with your provider about disease prevention and staying healthy. In between checkups, there are plenty of things you can do on your own. Experts have done a lot of research about which lifestyle changes and preventive measures are most likely to keep you healthy. Ask your health care provider for more information. Weight and diet Eat a healthy diet  Be sure to include plenty of vegetables, fruits, low-fat dairy products, and lean protein.  Do not eat a lot of foods high in solid fats, added sugars, or salt.  Get regular exercise. This is one of the most important things you can do for your health. ? Most adults should exercise for at least 150 minutes each week. The exercise should increase your heart rate and make you sweat (moderate-intensity exercise). ? Most adults should also do strengthening exercises at least twice a week. This is in addition to the moderate-intensity exercise.  Maintain a healthy weight  Body mass index (BMI) is a measurement that can be used to identify possible weight problems. It estimates body fat based on height and weight. Your health care provider can help determine your BMI and help you achieve or maintain a healthy weight.  For females 65 years of age and older: ? A BMI below 18.5 is considered underweight. ? A BMI of 18.5 to 24.9 is normal. ? A BMI of 25 to 29.9 is considered overweight. ? A BMI of 30 and above is considered obese.  Watch levels of cholesterol and blood  lipids  You should start having your blood tested for lipids and cholesterol at 65 years of age, then have this test every 5 years.  You may need to have your cholesterol levels checked more often if: ? Your lipid or cholesterol levels are high. ? You are older than 65 years of age. ? You are at high risk for heart disease.  Cancer screening Lung Cancer  Lung cancer screening is recommended for adults 43-80 years old who are at high risk for lung cancer because of a history of smoking.  A yearly low-dose CT scan of the lungs is recommended for people who: ? Currently smoke. ? Have quit within the past 15 years. ? Have at least a 30-pack-year history of smoking. A pack year is smoking an average of one pack of cigarettes a day for 1 year.  Yearly screening should continue until it has been 15 years since you quit.  Yearly screening should stop if you develop a health problem that would prevent you from having lung cancer treatment.  Breast Cancer  Practice breast self-awareness. This means understanding how your breasts normally appear and feel.  It also means doing regular breast self-exams. Let your health care provider know about any changes, no matter how small.  If you are in your 20s or 30s, you should have a clinical breast exam (CBE) by a health care provider every 1-3 years as part of a regular health exam.  If you are 13 or older, have a CBE every year.  year. Also consider having a breast X-ray (mammogram) every year.  If you have a family history of breast cancer, talk to your health care provider about genetic screening.  If you are at high risk for breast cancer, talk to your health care provider about having an MRI and a mammogram every year.  Breast cancer gene (BRCA) assessment is recommended for women who have family members with BRCA-related cancers. BRCA-related cancers include: ? Breast. ? Ovarian. ? Tubal. ? Peritoneal cancers.  Results of the assessment  will determine the need for genetic counseling and BRCA1 and BRCA2 testing.  Cervical Cancer Your health care provider may recommend that you be screened regularly for cancer of the pelvic organs (ovaries, uterus, and vagina). This screening involves a pelvic examination, including checking for microscopic changes to the surface of your cervix (Pap test). You may be encouraged to have this screening done every 3 years, beginning at age 21.  For women ages 30-65, health care providers may recommend pelvic exams and Pap testing every 3 years, or they may recommend the Pap and pelvic exam, combined with testing for human papilloma virus (HPV), every 5 years. Some types of HPV increase your risk of cervical cancer. Testing for HPV may also be done on women of any age with unclear Pap test results.  Other health care providers may not recommend any screening for nonpregnant women who are considered low risk for pelvic cancer and who do not have symptoms. Ask your health care provider if a screening pelvic exam is right for you.  If you have had past treatment for cervical cancer or a condition that could lead to cancer, you need Pap tests and screening for cancer for at least 20 years after your treatment. If Pap tests have been discontinued, your risk factors (such as having a new sexual partner) need to be reassessed to determine if screening should resume. Some women have medical problems that increase the chance of getting cervical cancer. In these cases, your health care provider may recommend more frequent screening and Pap tests.  Colorectal Cancer  This type of cancer can be detected and often prevented.  Routine colorectal cancer screening usually begins at 65 years of age and continues through 65 years of age.  Your health care provider may recommend screening at an earlier age if you have risk factors for colon cancer.  Your health care provider may also recommend using home test kits to  check for hidden blood in the stool.  A small camera at the end of a tube can be used to examine your colon directly (sigmoidoscopy or colonoscopy). This is done to check for the earliest forms of colorectal cancer.  Routine screening usually begins at age 50.  Direct examination of the colon should be repeated every 5-10 years through 65 years of age. However, you may need to be screened more often if early forms of precancerous polyps or small growths are found.  Skin Cancer  Check your skin from head to toe regularly.  Tell your health care provider about any new moles or changes in moles, especially if there is a change in a mole's shape or color.  Also tell your health care provider if you have a mole that is larger than the size of a pencil eraser.  Always use sunscreen. Apply sunscreen liberally and repeatedly throughout the day.  Protect yourself by wearing long sleeves, pants, a wide-brimmed hat, and sunglasses whenever you are outside.  Heart disease, diabetes, and   high blood pressure  High blood pressure causes heart disease and increases the risk of stroke. High blood pressure is more likely to develop in: ? People who have blood pressure in the high end of the normal range (130-139/85-89 mm Hg). ? People who are overweight or obese. ? People who are African American.  If you are 18-39 years of age, have your blood pressure checked every 3-5 years. If you are 40 years of age or older, have your blood pressure checked every year. You should have your blood pressure measured twice-once when you are at a hospital or clinic, and once when you are not at a hospital or clinic. Record the average of the two measurements. To check your blood pressure when you are not at a hospital or clinic, you can use: ? An automated blood pressure machine at a pharmacy. ? A home blood pressure monitor.  If you are between 55 years and 79 years old, ask your health care provider if you should  take aspirin to prevent strokes.  Have regular diabetes screenings. This involves taking a blood sample to check your fasting blood sugar level. ? If you are at a normal weight and have a low risk for diabetes, have this test once every three years after 65 years of age. ? If you are overweight and have a high risk for diabetes, consider being tested at a younger age or more often. Preventing infection Hepatitis B  If you have a higher risk for hepatitis B, you should be screened for this virus. You are considered at high risk for hepatitis B if: ? You were born in a country where hepatitis B is common. Ask your health care provider which countries are considered high risk. ? Your parents were born in a high-risk country, and you have not been immunized against hepatitis B (hepatitis B vaccine). ? You have HIV or AIDS. ? You use needles to inject street drugs. ? You live with someone who has hepatitis B. ? You have had sex with someone who has hepatitis B. ? You get hemodialysis treatment. ? You take certain medicines for conditions, including cancer, organ transplantation, and autoimmune conditions.  Hepatitis C  Blood testing is recommended for: ? Everyone born from 1945 through 1965. ? Anyone with known risk factors for hepatitis C.  Sexually transmitted infections (STIs)  You should be screened for sexually transmitted infections (STIs) including gonorrhea and chlamydia if: ? You are sexually active and are younger than 65 years of age. ? You are older than 65 years of age and your health care provider tells you that you are at risk for this type of infection. ? Your sexual activity has changed since you were last screened and you are at an increased risk for chlamydia or gonorrhea. Ask your health care provider if you are at risk.  If you do not have HIV, but are at risk, it may be recommended that you take a prescription medicine daily to prevent HIV infection. This is called  pre-exposure prophylaxis (PrEP). You are considered at risk if: ? You are sexually active and do not regularly use condoms or know the HIV status of your partner(s). ? You take drugs by injection. ? You are sexually active with a partner who has HIV.  Talk with your health care provider about whether you are at high risk of being infected with HIV. If you choose to begin PrEP, you should first be tested for HIV. You should then be   every 3 months for as long as you are taking PrEP. Pregnancy  If you are premenopausal and you may become pregnant, ask your health care provider about preconception counseling.  If you may become pregnant, take 400 to 800 micrograms (mcg) of folic acid every day.  If you want to prevent pregnancy, talk to your health care provider about birth control (contraception). Osteoporosis and menopause  Osteoporosis is a disease in which the bones lose minerals and strength with aging. This can result in serious bone fractures. Your risk for osteoporosis can be identified using a bone density scan.  If you are 9 years of age or older, or if you are at risk for osteoporosis and fractures, ask your health care provider if you should be screened.  Ask your health care provider whether you should take a calcium or vitamin D supplement to lower your risk for osteoporosis.  Menopause may have certain physical symptoms and risks.  Hormone replacement therapy may reduce some of these symptoms and risks. Talk to your health care provider about whether hormone replacement therapy is right for you. Follow these instructions at home:  Schedule regular health, dental, and eye exams.  Stay current with your immunizations.  Do not use any tobacco products including cigarettes, chewing tobacco, or electronic cigarettes.  If you are pregnant, do not drink alcohol.  If you are breastfeeding, limit how much and how often you drink alcohol.  Limit alcohol intake to no more  than 1 drink per day for nonpregnant women. One drink equals 12 ounces of beer, 5 ounces of wine, or 1 ounces of hard liquor.  Do not use street drugs.  Do not share needles.  Ask your health care provider for help if you need support or information about quitting drugs.  Tell your health care provider if you often feel depressed.  Tell your health care provider if you have ever been abused or do not feel safe at home. This information is not intended to replace advice given to you by your health care provider. Make sure you discuss any questions you have with your health care provider. Document Released: 11/19/2010 Document Revised: 10/12/2015 Document Reviewed: 02/07/2015 [employer] Interactive Patient Education  [employer].

## 2018-02-03 ENCOUNTER — Encounter: Payer: Self-pay | Admitting: Internal Medicine

## 2018-02-03 LAB — HM DEXA SCAN

## 2018-03-04 ENCOUNTER — Encounter: Payer: Self-pay | Admitting: Internal Medicine

## 2018-05-05 ENCOUNTER — Encounter: Payer: Self-pay | Admitting: Internal Medicine

## 2018-08-14 ENCOUNTER — Encounter: Payer: Self-pay | Admitting: Internal Medicine

## 2018-11-12 NOTE — Progress Notes (Signed)
Chief Complaint  Patient presents with  . Annual Exam    pt has no concerns today    HPI: Patient  Linda Richard  68 y.o. comes in today for Preventive Health Care visit  Sees gyne doing well on meds make her feel like herself  Health Maintenance  Topic Date Due  . Hepatitis C Screening  June 21, 1950  . PNA vac Low Risk Adult (1 of 2 - PCV13) 09/24/2017  . INFLUENZA VACCINE  12/19/2018  . MAMMOGRAM  01/31/2019  . COLONOSCOPY  09/29/2024  . TETANUS/TDAP  04/03/2025  . 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 GI /GU: No adominal pain, vomiting, change in bowel habits. No blood in the stool. No significant GU symptoms. SKIN/HEME: ,no acute skin rashes suspicious lesions or bleeding. No lymphadenopathy, nodules, masses.  NEURO/ PSYCH:  No neurologic signs such as weakness numbness. No depression anxiety. IMM/ Allergy: No unusual infections.  Allergy .   REST of 12 system review negative except as per HPI   Past Medical History:  Diagnosis Date  . Anxiety    takes Lexapro daily-just started 01/19/15  . BACK PAIN 01/24/2009   Qualifier: Diagnosis of  By: Regis Bill MD, Standley Brooking Uses inversion therapy and doing very well   . Diverticulitis of intestine with perforation 10/06/2014  . DVT (deep vein thrombosis) in pregnancy 1983   behind left leg  . History of kidney stones 2011  . History of shingles   . Joint pain   . 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 Pneumonia as a child  . Urinary frequency   . Urinary urgency     just started taking Myrbetriq  . Varicose veins    strip and laser    Past Surgical History:  Procedure Laterality Date  . CESAREAN SECTION  1983  . CHOLECYSTECTOMY N/A 01/30/2015   Procedure: LAPAROSCOPIC CHOLECYSTECTOMY;  Surgeon: Rolm Bookbinder, MD;  Location: [facility];  Service: General;  Laterality: N/A;  . COLONOSCOPY    . TONSILLECTOMY  as a child  . veins stripped  80's   laser RX     Family History  Problem Relation Age of Onset  . Hypertension Unknown   . Kidney disease Unknown        renal stones  . Arthritis Unknown   . Cancer Father        bladder   58s   . Ovarian cancer Mother        39s  . Other Unknown        son age 81 irreg HB  had cardioversion   . Thyroid disease Unknown   . Colon polyps Neg Hx   . Colon cancer Neg Hx   . Rectal cancer Neg Hx   . Stomach cancer Neg Hx     Social History   Socioeconomic History  . Marital status: Married    Spouse name: Not on file  . Number of children: Not on file  . Years of education: Not on file  .  Highest education level: Not on file  Occupational History  . Not on file  Social Needs  . Financial resource strain: Not on file  . Food insecurity    Worry: Not on file    Inability: Not on file  . Transportation needs    Medical: Not on file    Non-medical: Not on file  Tobacco Use  . Smoking status: Never Smoker  . Smokeless tobacco: Never Used  Substance and Sexual Activity  . Alcohol use: Yes    Alcohol/week: 0.0 standard drinks    Comment: 3 x a week, mostly wine no t daily]  . Drug use: No  . Sexual activity: Yes    Birth control/protection: Post-menopausal  Lifestyle  . Physical activity    Days per week: Not on file    Minutes per session: Not on file  . Stress: Not on file  Relationships  . Social Herbalist on phone: Not on file    Gets together: Not on file    Attends religious service: Not on file    Active member of club or organization: Not on file    Attends  meetings of clubs or organizations: Not on file    Relationship status: Not on file  Other Topics Concern  . Not on file  Social History Narrative   HH of 2    Grandchild  Sons    reitred [employer] Child Support   Married.    No  ets tobacco No  Exercise restriction   etoh 2 x per week.    Exercise good.       G3P2    Outpatient Medications Prior to Visit  Medication Sig Dispense Refill  . Acetaminophen (TYLENOL ARTHRITIS PAIN PO) Take 800 mg by mouth daily as needed.    Marland Kitchen aspirin 81 MG tablet Take 81 mg by mouth daily.      . busPIRone (BUSPAR) 7.5 MG tablet Take 7.5 mg twice daily    . calcium-vitamin D (OSCAL WITH D) 500-200 MG-UNIT per tablet Take 1 tablet by mouth daily.      Marland Kitchen escitalopram (LEXAPRO) 10 MG tablet Take 10 mg orally daily    . estradiol (ESTRACE) 0.1 MG/GM vaginal cream Place 1 g vaginally 2 (two) times a week. Monday and Friday    . Multiple Vitamins-Minerals (ONE DAILY WOMENS 50+ PO) Take 1 tablet by mouth daily.    . Probiotic Product (PROBIOTIC DAILY PO) Take by mouth.    . tretinoin (RETIN-A) 0.025 % cream as needed.  99   No facility-administered medications prior to visit.      EXAM:  BP 120/62 (BP Location: Right Arm, Patient Position: Sitting, Cuff Size: Normal)   Pulse 82   Temp 98 F (36.7 C) (Oral)   Ht 5' 8.25" (1.734 m)   Wt 130 lb (59 kg)   SpO2 98%   BMI 19.62 kg/m   Body mass index is 19.62 kg/m. Wt Readings from Last 3 Encounters:  11/13/18 130 lb (59 kg)  08/06/17 129 lb 3.2 oz (58.6 kg)  05/14/16 129 lb (58.5 kg)    Physical Exam: Vital signs reviewed LYY:TKPT is a well-developed well-nourished alert cooperative    who appearsr stated age in no acute distress.  HEENT: normocephalic atraumatic , Eyes: PERRL EOM's full, conjunctiva clear, Nares: paten,t no deformity discharge or tenderness., Ears: no deformity EAC's clear TMs with normal landmarks. Mouth: clear OPdeferred NECK: supple without masses, thyromegaly or bruits.  CHEST/PULM:  Clear to auscultation and percussion breath sounds equal no wheeze , rales or rhonchi. No chest wall deformities or tenderness. Breast: normal by inspection . No dimpling, discharge, masses, tenderness or discharge . CV: PMI is nondisplaced, S1 S2 no gallops, murmurs, rubs. Peripheral pulses are full without delay.No JVD .  hasd varicose veins no ulcerations  ABDOMEN: Bowel sounds normal nontender  No guard or rebound, no hepato splenomegal no CVA tenderness.  No hernia. Extremtities:  No clubbing cyanosis or edema, no acute joint swelling or redness no focal atrophy NEURO:  Oriented x3, cranial nerves 3-12 appear to be intact, no obvious focal weakness,gait within normal limits no abnormal reflexes or asymmetrical SKIN: No acute rashes normal turgor, color, no bruising or petechiae. PSYCH: Oriented, good eye contact, no obvious depression anxiety, cognition and judgment appear normal. LN: no cervical axillary inguinal adenopathy  Lab Results  Component Value Date   WBC 5.4 08/06/2017   HGB 15.0 08/06/2017   HCT 43.4 08/06/2017   PLT 188.0 08/06/2017   GLUCOSE 102 (H) 08/06/2017   CHOL 237 (H) 08/06/2017   TRIG 66.0 08/06/2017   HDL 96.30 08/06/2017   LDLDIRECT 128.8 02/23/2013   LDLCALC 127 (H) 08/06/2017   ALT 19 08/06/2017   AST 22 08/06/2017   NA 143 08/06/2017   K 4.3 08/06/2017   CL 103 08/06/2017   CREATININE 0.43 08/06/2017   BUN 14 08/06/2017   CO2 28 08/06/2017   TSH 1.19 08/06/2017   HGBA1C 5.3 02/26/2012   MICROALBUR 2.0 (H) 01/24/2009    BP Readings from Last 3 Encounters:  11/13/18 120/62  08/06/17 124/84  05/14/16 134/80    Lab plan reviewed with patient   ASSESSMENT AND PLAN:  Discussed the following assessment and plan:    ICD-10-CM   1. Visit for preventive health examination  Z00.00   2. Medication management  Z61.096 Basic metabolic panel    CBC with Differential/Platelet    Hemoglobin A1c    Hepatic function panel    Lipid panel     TSH  3. Hyperlipidemia, unspecified hyperlipidemia type  E45.4 Basic metabolic panel    CBC with Differential/Platelet    Hemoglobin A1c    Hepatic function panel    Lipid panel    TSH  4. Osteopenia, unspecified location  U98.11 Basic metabolic panel    CBC with Differential/Platelet    Hemoglobin A1c    Hepatic function panel    Lipid panel    TSH  5. History of hyperglycemia  B14.78 Basic metabolic panel    CBC with Differential/Platelet    Hemoglobin A1c    Hepatic function panel    Lipid panel    TSH  6. Need for pneumococcal vaccination  Z23 Pneumococcal polysaccharide vaccine 23-valent greater than or equal to 2yo subcutaneous/IM   disc  Vaccines  Pneumovax today   updating  Labs  And yearly fu  cpx  Patient Care Team: Taja Pentland, Standley Brooking, MD as PCP - General Brien Few, MD (Obstetrics and Gynecology) Ralene Bathe, MD as Consulting Physician (Ophthalmology) Levy Sjogren, MD as Referring Physician (Dermatology) Avon Gully, NP as Nurse Practitioner (Obstetrics and Gynecology) Patient Instructions  Glad you are doing well. Continue lifestyle intervention healthy eating and exercise .  Will notify you  of labs when available.   Shingles vaccine when available  Flu vaccine in the fall.   Yearly check exam   Preventive Care 72 Years and Older, Female Preventive care refers to lifestyle choices and visits  with your health care provider that can promote health and wellness. What does preventive care include?  A yearly physical exam. This is also called an annual well check.  Dental exams once or twice a year.  Routine eye exams. Ask your health care provider how often you should have your eyes checked.  Personal lifestyle choices, including: ? Daily care of your teeth and gums. ? Regular physical activity. ? Eating a healthy diet. ? Avoiding tobacco and drug use. ? Limiting alcohol use. ? Practicing safe sex. ? Taking low-dose aspirin  every day. ? Taking vitamin and mineral supplements as recommended by your health care provider. What happens during an annual well check? The services and screenings done by your health care provider during your annual well check will depend on your age, overall health, lifestyle risk factors, and family history of disease. Counseling Your health care provider may ask you questions about your:  Alcohol use.  Tobacco use.  Drug use.  Emotional well-being.  Home and relationship well-being.  Sexual activity.  Eating habits.  History of falls.  Memory and ability to understand (cognition).  Work and work Statistician.  Reproductive health.  Screening You may have the following tests or measurements:  Height, weight, and BMI.  Blood pressure.  Lipid and cholesterol levels. These may be checked every 5 years, or more frequently if you are over 40 years old.  Skin check.  Lung cancer screening. You may have this screening every year starting at age 79 if you have a 30-pack-year history of smoking and currently smoke or have quit within the past 15 years.  Colorectal cancer screening. All adults should have this screening starting at age 61 and continuing until age 16. You will have tests every 1-10 years, depending on your results and the type of screening test. People at increased risk should start screening at an earlier age. Screening tests may include: ? Guaiac-based fecal occult blood testing. ? Fecal immunochemical test (FIT). ? Stool DNA test. ? Virtual colonoscopy. ? Sigmoidoscopy. During this test, a flexible tube with a tiny camera (sigmoidoscope) is used to examine your rectum and lower colon. The sigmoidoscope is inserted through your anus into your rectum and lower colon. ? Colonoscopy. During this test, a long, thin, flexible tube with a tiny camera (colonoscope) is used to examine your entire colon and rectum.  Hepatitis C blood test.  Hepatitis B blood  test.  Sexually transmitted disease (STD) testing.  Diabetes screening. This is done by checking your blood sugar (glucose) after you have not eaten for a while (fasting). You may have this done every 1-3 years.  Bone density scan. This is done to screen for osteoporosis. You may have this done starting at age 28.  Mammogram. This may be done every 1-2 years. Talk to your health care provider about how often you should have regular mammograms. Talk with your health care provider about your test results, treatment options, and if necessary, the need for more tests. Vaccines Your health care provider may recommend certain vaccines, such as:  Influenza vaccine. This is recommended every year.  Tetanus, diphtheria, and acellular pertussis (Tdap, Td) vaccine. You may need a Td booster every 10 years.  Varicella vaccine. You may need this if you have not been vaccinated.  Zoster vaccine. You may need this after age 89.  Measles, mumps, and rubella (MMR) vaccine. You may need at least one dose of MMR if you were born in 1957 or later. You may  also need a second dose.  Pneumococcal 13-valent conjugate (PCV13) vaccine. One dose is recommended after age 47.  Pneumococcal polysaccharide (PPSV23) vaccine. One dose is recommended after age 41.  Meningococcal vaccine. You may need this if you have certain conditions.  Hepatitis A vaccine. You may need this if you have certain conditions or if you travel or work in places where you may be exposed to hepatitis A.  Hepatitis B vaccine. You may need this if you have certain conditions or if you travel or work in places where you may be exposed to hepatitis B.  Haemophilus influenzae type b (Hib) vaccine. You may need this if you have certain conditions. Talk to your health care provider about which screenings and vaccines you need and how often you need them. This information is not intended to replace advice given to you by your health care  provider. Make sure you discuss any questions you have with your health care provider. Document Released: 06/02/2015 Document Revised: 06/26/2017 Document Reviewed: 03/07/2015 Elsevier Interactive Patient Education  2019 [place] K. Ludell Zacarias M.D.

## 2018-11-13 ENCOUNTER — Encounter: Payer: Self-pay | Admitting: Internal Medicine

## 2018-11-13 ENCOUNTER — Ambulatory Visit (INDEPENDENT_AMBULATORY_CARE_PROVIDER_SITE_OTHER): Payer: Medicare Other | Admitting: Internal Medicine

## 2018-11-13 ENCOUNTER — Other Ambulatory Visit: Payer: Self-pay

## 2018-11-13 VITALS — BP 120/62 | HR 82 | Temp 98.0°F | Ht 68.25 in | Wt 130.0 lb

## 2018-11-13 DIAGNOSIS — Z79899 Other long term (current) drug therapy: Secondary | ICD-10-CM

## 2018-11-13 DIAGNOSIS — Z0000 Encounter for general adult medical examination without abnormal findings: Secondary | ICD-10-CM | POA: Diagnosis not present

## 2018-11-13 DIAGNOSIS — E785 Hyperlipidemia, unspecified: Secondary | ICD-10-CM

## 2018-11-13 DIAGNOSIS — Z23 Encounter for immunization: Secondary | ICD-10-CM | POA: Diagnosis not present

## 2018-11-13 DIAGNOSIS — M8580 Other specified disorders of bone density and structure, unspecified site: Secondary | ICD-10-CM | POA: Diagnosis not present

## 2018-11-13 DIAGNOSIS — Z8639 Personal history of other endocrine, nutritional and metabolic disease: Secondary | ICD-10-CM

## 2018-11-13 LAB — HEPATIC FUNCTION PANEL
ALT: 18 U/L (ref 0–35)
AST: 22 U/L (ref 0–37)
Albumin: 4.3 g/dL (ref 3.5–5.2)
Alkaline Phosphatase: 48 U/L (ref 39–117)
Bilirubin, Direct: 0.1 mg/dL (ref 0.0–0.3)
Total Bilirubin: 0.8 mg/dL (ref 0.2–1.2)
Total Protein: 6.8 g/dL (ref 6.0–8.3)

## 2018-11-13 LAB — BASIC METABOLIC PANEL
BUN: 12 mg/dL (ref 6–23)
CO2: 31 mEq/L (ref 19–32)
Calcium: 9.1 mg/dL (ref 8.4–10.5)
Chloride: 102 mEq/L (ref 96–112)
Creatinine, Ser: 0.47 mg/dL (ref 0.40–1.20)
GFR: 132.52 mL/min (ref >60.00)
Glucose, Bld: 87 mg/dL (ref 70–99)
Potassium: 4.1 mEq/L (ref 3.5–5.1)
Sodium: 140 mEq/L (ref 135–145)

## 2018-11-13 LAB — CBC WITH DIFFERENTIAL/PLATELET
Basophils Absolute: 0 10*3/uL (ref 0.0–0.1)
Basophils Relative: 0.6 % (ref 0.0–3.0)
Eosinophils Absolute: 0.1 10*3/uL (ref 0.0–0.7)
Eosinophils Relative: 2.3 % (ref 0.0–5.0)
HCT: 44.3 % (ref 36.0–46.0)
Hemoglobin: 15 g/dL (ref 12.0–15.0)
Lymphocytes Relative: 24.6 % (ref 12.0–46.0)
Lymphs Abs: 1.6 10*3/uL (ref 0.7–4.0)
MCHC: 33.8 g/dL (ref 30.0–36.0)
MCV: 92.6 fl (ref 78.0–100.0)
Monocytes Absolute: 0.5 10*3/uL (ref 0.1–1.0)
Monocytes Relative: 7.6 % (ref 3.0–12.0)
Neutro Abs: 4.1 10*3/uL (ref 1.4–7.7)
Neutrophils Relative %: 64.9 % (ref 43.0–77.0)
Platelets: 196 10*3/uL (ref 150.0–400.0)
RBC: 4.79 Mil/uL (ref 3.87–5.11)
RDW: 13.1 % (ref 11.5–15.5)
WBC: 6.3 10*3/uL (ref 4.0–10.5)

## 2018-11-13 LAB — LIPID PANEL
Cholesterol: 228 mg/dL — ABNORMAL HIGH (ref 0–200)
HDL: 94.2 mg/dL (ref >39.00)
LDL Cholesterol: 123 mg/dL — ABNORMAL HIGH (ref 0–99)
NonHDL: 133.31
Total CHOL/HDL Ratio: 2
Triglycerides: 52 mg/dL (ref 0.0–149.0)
VLDL: 10.4 mg/dL (ref 0.0–40.0)

## 2018-11-13 LAB — TSH: TSH: 1.14 u[IU]/mL (ref 0.35–4.50)

## 2018-11-13 LAB — HEMOGLOBIN A1C: Hgb A1c MFr Bld: 5.3 % (ref 4.6–6.5)

## 2018-11-13 NOTE — Patient Instructions (Addendum)
Glad you are doing well. Continue lifestyle intervention healthy eating and exercise .  Will notify you  of labs when available.   Shingles vaccine when available  Flu vaccine in the fall.   Yearly check exam   Preventive Care 8 Years and Older, Female Preventive care refers to lifestyle choices and visits with your health care provider that can promote health and wellness. What does preventive care include?  A yearly physical exam. This is also called an annual well check.  Dental exams once or twice a year.  Routine eye exams. Ask your health care provider how often you should have your eyes checked.  Personal lifestyle choices, including: ? Daily care of your teeth and gums. ? Regular physical activity. ? Eating a healthy diet. ? Avoiding tobacco and drug use. ? Limiting alcohol use. ? Practicing safe sex. ? Taking low-dose aspirin every day. ? Taking vitamin and mineral supplements as recommended by your health care provider. What happens during an annual well check? The services and screenings done by your health care provider during your annual well check will depend on your age, overall health, lifestyle risk factors, and family history of disease. Counseling Your health care provider may ask you questions about your:  Alcohol use.  Tobacco use.  Drug use.  Emotional well-being.  Home and relationship well-being.  Sexual activity.  Eating habits.  History of falls.  Memory and ability to understand (cognition).  Work and work Statistician.  Reproductive health.  Screening You may have the following tests or measurements:  Height, weight, and BMI.  Blood pressure.  Lipid and cholesterol levels. These may be checked every 5 years, or more frequently if you are over 32 years old.  Skin check.  Lung cancer screening. You may have this screening every year starting at age 32 if you have a 30-pack-year history of smoking and currently smoke or have  quit within the past 15 years.  Colorectal cancer screening. All adults should have this screening starting at age 68 and continuing until age 78. You will have tests every 1-10 years, depending on your results and the type of screening test. People at increased risk should start screening at an earlier age. Screening tests may include: ? Guaiac-based fecal occult blood testing. ? Fecal immunochemical test (FIT). ? Stool DNA test. ? Virtual colonoscopy. ? Sigmoidoscopy. During this test, a flexible tube with a tiny camera (sigmoidoscope) is used to examine your rectum and lower colon. The sigmoidoscope is inserted through your anus into your rectum and lower colon. ? Colonoscopy. During this test, a long, thin, flexible tube with a tiny camera (colonoscope) is used to examine your entire colon and rectum.  Hepatitis C blood test.  Hepatitis B blood test.  Sexually transmitted disease (STD) testing.  Diabetes screening. This is done by checking your blood sugar (glucose) after you have not eaten for a while (fasting). You may have this done every 1-3 years.  Bone density scan. This is done to screen for osteoporosis. You may have this done starting at age 60.  Mammogram. This may be done every 1-2 years. Talk to your health care provider about how often you should have regular mammograms. Talk with your health care provider about your test results, treatment options, and if necessary, the need for more tests. Vaccines Your health care provider may recommend certain vaccines, such as:  Influenza vaccine. This is recommended every year.  Tetanus, diphtheria, and acellular pertussis (Tdap, Td) vaccine. You may need  a Td booster every 10 years.  Varicella vaccine. You may need this if you have not been vaccinated.  Zoster vaccine. You may need this after age 62.  Measles, mumps, and rubella (MMR) vaccine. You may need at least one dose of MMR if you were born in 1957 or later. You may  also need a second dose.  Pneumococcal 13-valent conjugate (PCV13) vaccine. One dose is recommended after age 67.  Pneumococcal polysaccharide (PPSV23) vaccine. One dose is recommended after age 72.  Meningococcal vaccine. You may need this if you have certain conditions.  Hepatitis A vaccine. You may need this if you have certain conditions or if you travel or work in places where you may be exposed to hepatitis A.  Hepatitis B vaccine. You may need this if you have certain conditions or if you travel or work in places where you may be exposed to hepatitis B.  Haemophilus influenzae type b (Hib) vaccine. You may need this if you have certain conditions. Talk to your health care provider about which screenings and vaccines you need and how often you need them. This information is not intended to replace advice given to you by your health care provider. Make sure you discuss any questions you have with your health care provider. Document Released: 06/02/2015 Document Revised: 06/26/2017 Document Reviewed: 03/07/2015 Elsevier Interactive Patient Education  2019 [employer].

## 2019-02-08 LAB — HM MAMMOGRAPHY

## 2019-02-22 ENCOUNTER — Encounter: Payer: Self-pay | Admitting: Internal Medicine

## 2019-02-22 LAB — HM MAMMOGRAPHY

## 2019-03-10 ENCOUNTER — Encounter: Payer: Self-pay | Admitting: Internal Medicine

## 2019-03-15 ENCOUNTER — Encounter: Payer: Self-pay | Admitting: Internal Medicine

## 2019-07-19 ENCOUNTER — Ambulatory Visit: Payer: Medicare Other | Attending: Internal Medicine

## 2019-07-19 DIAGNOSIS — Z23 Encounter for immunization: Secondary | ICD-10-CM

## 2019-07-19 NOTE — Progress Notes (Signed)
   Covid-19 Vaccination Clinic  Name:  POOJA CAMUSO    MRN: [id number] DOB: 10-13-1950  07/19/2019  Ms. Bedingfield was observed post Covid-19 immunization for 15 minutes without incidence. She was provided with Vaccine Information Sheet and instruction to access the V-Safe system.   Ms. Crenshaw was instructed to call 911 with any severe reactions post vaccine: Marland Kitchen Difficulty breathing  . Swelling of your face and throat  . A fast heartbeat  . A bad rash all over your body  . Dizziness and weakness    Immunizations Administered    Name Date Dose VIS Date Route   Pfizer COVID-19 Vaccine 07/19/2019 10:14 AM 0.3 mL 04/30/2019 Intramuscular   Manufacturer: [employer], [employer]   Lot: [id number]   NDC: [address]-5

## 2019-08-17 ENCOUNTER — Ambulatory Visit: Payer: Medicare Other | Attending: Internal Medicine

## 2019-08-17 DIAGNOSIS — Z23 Encounter for immunization: Secondary | ICD-10-CM

## 2019-08-17 NOTE — Progress Notes (Signed)
   Covid-19 Vaccination Clinic  Name:  Linda Richard    MRN: [id number] DOB: [birth year]  08/17/2019  Ms. Mccarroll was observed post Covid-19 immunization for 15 minutes without incident. She was provided with Vaccine Information Sheet and instruction to access the V-Safe system.   Ms. Chimenti was instructed to call 911 with any severe reactions post vaccine: Marland Kitchen Difficulty breathing  . Swelling of face and throat  . A fast heartbeat  . A bad rash all over body  . Dizziness and weakness   Immunizations Administered    Name Date Dose VIS Date Route   Pfizer COVID-19 Vaccine 08/17/2019 10:38 AM 0.3 mL 04/30/2019 Intramuscular   Manufacturer: [employer], [employer]   Lot: [id number]   NDC: [address]-3

## 2019-09-17 ENCOUNTER — Other Ambulatory Visit: Payer: Self-pay

## 2019-09-17 ENCOUNTER — Telehealth: Payer: Medicare Other | Admitting: Internal Medicine

## 2019-09-17 ENCOUNTER — Telehealth (INDEPENDENT_AMBULATORY_CARE_PROVIDER_SITE_OTHER): Payer: Medicare Other | Admitting: Internal Medicine

## 2019-09-17 ENCOUNTER — Encounter: Payer: Self-pay | Admitting: Internal Medicine

## 2019-09-17 VITALS — Temp 98.9°F | Ht 68.25 in | Wt 127.0 lb

## 2019-09-17 DIAGNOSIS — R1032 Left lower quadrant pain: Secondary | ICD-10-CM | POA: Diagnosis not present

## 2019-09-17 MED ORDER — AMOXICILLIN-POT CLAVULANATE 875-125 MG PO TABS: 1.0000 | ORAL_TABLET | Freq: Two times a day (BID) | ORAL | 0 refills | Status: DC

## 2019-09-17 NOTE — Progress Notes (Signed)
Virtual Visit via Video Note  I connected with@ on 09/17/19 at  1:30 PM EDT by a video enabled telemedicine application and verified that I am speaking with the correct person using two identifiers. Location patient: home Location provider:work  office Persons participating in the virtual visit: patient, provider  [place]th national recommendations  regarding COVID 19 pandemic   video visit is advised over in office visit for this patient.  Patient aware  of the limitations of evaluation and management by telemedicine and  availability of in person appointments. and agreed to proceed.   HPI: Linda Richard presents for video visit sda  For new problem  Onset 5 days ago of nausea and some ab discomfort  Dec appetite  Better for 2 days and now worse with fatigue  llq tenderness and radiating to left back  No vomiting had one loose stool no one else sick . fels similar but no where near as bad when had Bo and abscess bowel perf.  Last colon 5 years ago   Had problem after the procedure.  No one else is sick   ROS: See pertinent positives and negatives per HPI. No gu uto sx hematuria  Remote hx of kidney stone on ct in past Past Medical History:  Diagnosis Date  . Anxiety    takes Lexapro daily-just started 01/19/15  . BACK PAIN 01/24/2009   Qualifier: Diagnosis of  By: Fabian Sharp MD, Neta Mends Uses inversion therapy and doing very well   . Diverticulitis of intestine with perforation 10/06/2014  . DVT (deep vein thrombosis) in pregnancy 1983   behind left leg  . History of kidney stones 2011  . History of shingles   . Joint pain   . NEPHROLITHIASIS 05/02/2010   Qualifier: Diagnosis of  By: Fabian Sharp MD, Neta Mends   . Pneumonia as a child  . Urinary frequency   . Urinary urgency    just started taking Myrbetriq  . Varicose veins    strip and laser    Past Surgical History:  Procedure Laterality Date  . CESAREAN SECTION  1983  . CHOLECYSTECTOMY N/A 01/30/2015   Procedure: LAPAROSCOPIC  CHOLECYSTECTOMY;  Surgeon: Emelia Loron, MD;  Location: [facility] OR;  Service: General;  Laterality: N/A;  . COLONOSCOPY    . TONSILLECTOMY  as a child  . veins stripped  80's   laser RX     Family History  Problem Relation Age of Onset  . Hypertension Other   . Kidney disease Other        renal stones  . Arthritis Other   . Cancer Father        bladder   58s   . Ovarian cancer Mother        36s  . Other Other        son age 12 irreg HB  had cardioversion   . Thyroid disease Other   . Colon polyps Neg Hx   . Colon cancer Neg Hx   . Rectal cancer Neg Hx   . Stomach cancer Neg Hx     Social History   Tobacco Use  . Smoking status: Never Smoker  . Smokeless tobacco: Never Used  Substance Use Topics  . Alcohol use: Yes    Alcohol/week: 0.0 standard drinks    Comment: 3 x a week, mostly wine no t daily]  . Drug use: No      Current Outpatient Medications:  .  Acetaminophen (TYLENOL ARTHRITIS PAIN PO), Take 800 mg  by mouth daily as needed., Disp: , Rfl:  .  aspirin 81 MG tablet, Take 81 mg by mouth daily.  , Disp: , Rfl:  .  busPIRone (BUSPAR) 7.5 MG tablet, Take 7.5 mg twice daily, Disp: , Rfl:  .  calcium-vitamin D (OSCAL WITH D) 500-200 MG-UNIT per tablet, Take 1 tablet by mouth daily.  , Disp: , Rfl:  .  escitalopram (LEXAPRO) 10 MG tablet, Take 10 mg orally daily, Disp: , Rfl:  .  estradiol (ESTRACE) 0.1 MG/GM vaginal cream, Place 1 g vaginally 2 (two) times a week. Monday and Friday, Disp: , Rfl:  .  Multiple Vitamins-Minerals (ONE DAILY WOMENS 50+ PO), Take 1 tablet by mouth daily., Disp: , Rfl:  .  Probiotic Product (PROBIOTIC DAILY PO), Take by mouth., Disp: , Rfl:  .  tretinoin (RETIN-A) 0.025 % cream, as needed., Disp: , Rfl: 99 .  amoxicillin-clavulanate (AUGMENTIN) 875-125 MG tablet, Take 1 tablet by mouth every 12 (twelve) hours., Disp: 20 tablet, Rfl: 0  EXAM: BP Readings from Last 3 Encounters:  11/13/18 120/62  08/06/17 124/84  05/14/16 134/80     VITALS per patient if applicable:  GENERAL: alert, oriented, appears well and in no acute distress HEENT: atraumatic, conjunttiva clear, no obvious abnormalities on inspection of external nose and ears NECK: normal movements of the head and neck LUNGS: on inspection no signs of respiratory distress, breathing rate appears normal, no obvious gross SOB, gasping or wheezing CV: no obvious cyanosis MS: moves all visible extremities without noticeable abnormality PSYCH/NEURO: pleasant and cooperative, no obvious depression or anxiety, speech and thought processing grossly intact Lab Results  Component Value Date   WBC 6.3 11/13/2018   HGB 15.0 11/13/2018   HCT 44.3 11/13/2018   PLT 196.0 11/13/2018   GLUCOSE 87 11/13/2018   CHOL 228 (H) 11/13/2018   TRIG 52.0 11/13/2018   HDL 94.20 11/13/2018   LDLDIRECT 128.8 02/23/2013   LDLCALC 123 (H) 11/13/2018   ALT 18 11/13/2018   AST 22 11/13/2018   NA 140 11/13/2018   K 4.1 11/13/2018   CL 102 11/13/2018   CREATININE 0.47 11/13/2018   BUN 12 11/13/2018   CO2 31 11/13/2018   TSH 1.14 11/13/2018   HGBA1C 5.3 11/13/2018   MICROALBUR 2.0 (H) 01/24/2009    ASSESSMENT AND PLAN:  Discussed the following assessment and plan:    ICD-10-CM   1. LLQ pain  R10.32    w nausea  remote hx of Bo ? divertic  epmiric rx ( friday pm today) light diet in person eval next week to ed if alarm sx /plan prob ct in fu etc     Counseled.  rx augmentin for 10 days light  Liquid or soft diet as tolerated   Expectant management and discussion of plan and treatment with opportunity to ask questions and all were answered. The patient agreed with the plan and demonstrated an understanding of the instructions.   Advised to call back or seek an in-person evaluation if worsening  or having  further concerns . Return if symptoms worsen in interim, for next week in person fu .  Shanon Ace, MD   Outside external source  DATA REVIEWED:  Past surgery admission  2015 etc  Total time on date  of service including record review ordering and plan of care:  30 minutes

## 2019-09-17 NOTE — Progress Notes (Signed)
This visit occurred during the SARS-CoV-2 public health emergency.  Safety protocols were in place, including screening questions prior to the visit, additional usage of staff PPE, and extensive cleaning of exam room while observing appropriate contact time as indicated for disinfecting solutions.    Chief Complaint  Patient presents with  . Follow-up    LLQ pain has gotten much better    HPI: Linda Richard 69 y.o. come in for  FU  llq pain seen virtually on fridayApril 30  Began augmentin and feeling better after 48 hours  So far  Pain is amost gone eaing ok no vomiting or bowel changes  Back pain better  Antibiotic  Began Friday day of viist   Pain subsided  And  About  8 bact to normal most time  Still anxiety.  ROS: See pertinent positives and negatives per HPI. No uti hematuria   Past Medical History:  Diagnosis Date  . Anxiety    takes Lexapro daily-just started 01/19/15  . BACK PAIN 01/24/2009   Qualifier: Diagnosis of  By: Regis Bill MD, Standley Brooking Uses inversion therapy and doing very well   . Diverticulitis of intestine with perforation 10/06/2014  . DVT (deep vein thrombosis) in pregnancy 1983   behind left leg  . History of kidney stones 2011  . History of shingles   . Joint pain   . 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 Pneumonia as a child  . Urinary frequency   . Urinary urgency    just started taking Myrbetriq  . Varicose veins    strip and laser    Family History  Problem Relation Age of Onset  . Hypertension Other   . Kidney disease Other        renal stones  . Arthritis Other   . Cancer Father        bladder   25s   . Ovarian cancer Mother        75s  . Other Other        son age 52 irreg HB  had cardioversion   . Thyroid disease Other   . Colon polyps Neg Hx   . Colon cancer Neg Hx   . Rectal cancer Neg Hx   . Stomach cancer Neg Hx     Social History   Socioeconomic History  . Marital status: Married    Spouse name: Not on file  . Number of children: Not on file  . Years of education: Not on file  . Highest education level: Not on file  Occupational History  . Not on file  Tobacco Use  . Smoking status: Never Smoker  . Smokeless tobacco: Never Used  Substance and Sexual Activity  . Alcohol use: Yes    Alcohol/week: 0.0 standard drinks    Comment: 3 x a week, mostly wine no t daily]  . Drug use: No  . Sexual activity: Yes    Birth control/protection: Post-menopausal  Other Topics Concern  . Not on file  Social History Narrative   HH of 2    Grandchild  Sons    re[employer] Child Support   Married.    No  ets tobacco No  Exercise restriction   etoh 2 x per week.    Exercise good.       G3P2   Social Determinants of Health   Financial Resource Strain:   . Difficulty of Paying Living Expenses:  Food Insecurity:   . Worried About Programme researcher, broadcasting/film/video in the Last Year:   . Barista in the Last Year:   Transportation Needs:   . Freight forwarder (Medical):   Marland Kitchen Lack of Transportation (Non-Medical):   Physical Activity:   . Days of Exercise per Week:   . Minutes of Exercise per Session:   Stress:   . Feeling of Stress :   Social Connections:   . Frequency of Communication with Friends and Family:   . Frequency of Social Gatherings with Friends and Family:   . Attends Religious Services:   . Active Member of Clubs or Organizations:   . Attends Banker Meetings:   Marland Kitchen Marital Status:     Outpatient Medications Prior to Visit  Medication Sig Dispense Refill  . Acetaminophen (TYLENOL ARTHRITIS PAIN PO) Take 800 mg by mouth daily as needed.    Marland Kitchen amoxicillin-clavulanate (AUGMENTIN) 875-125 MG tablet Take 1 tablet by mouth every 12 (twelve) hours. 20 tablet 0  . aspirin 81 MG tablet Take 81 mg by mouth daily.      . busPIRone (BUSPAR) 7.5 MG tablet Take 7.5 mg twice daily    . calcium-vitamin D (OSCAL WITH D) 500-200 MG-UNIT per tablet Take 1 tablet  by mouth daily.      Marland Kitchen escitalopram (LEXAPRO) 10 MG tablet Take 10 mg orally daily    . estradiol (ESTRACE) 0.1 MG/GM vaginal cream Place 1 g vaginally 2 (two) times a week. Monday and Friday    . Multiple Vitamins-Minerals (ONE DAILY WOMENS 50+ PO) Take 1 tablet by mouth daily.    . Probiotic Product (PROBIOTIC DAILY PO) Take by mouth.    . tretinoin (RETIN-A) 0.025 % cream as needed.  99   No facility-administered medications prior to visit.     EXAM:  BP 128/80   Pulse 73   Temp 98 F (36.7 C) (Temporal)   Ht 5' 8.25" (1.734 m)   Wt 128 lb 3.2 oz (58.2 kg)   SpO2 98%   BMI 19.35 kg/m   Body mass index is 19.35 kg/m.  GENERAL: vitals reviewed and listed above, alert, oriented, appears well hydrated and in no acute distress HEENT: atraumatic, conjunctiva  clear, no obvious abnormalities on inspection of external nose and ears OP : masked  NECK: no obvious masses on inspection palpation  LUNGS: clear to auscultation bilaterally, no wheezes, rales or rhonchi, good air movement mild scoliosis  CV: HRRR, no clubbing cyanosis or  peripheral edema nl cap refill  Abdomen:  Sof,t normal bowel sounds without hepatosplenomegaly, no guarding rebound or masses no CVA tenderness  Min localized llq tenderness without mass  Nl gait  MS: moves all extremities without noticeable focal  abnormality PSYCH: pleasant and cooperative, no obvious depression or anxiety Lab Results  Component Value Date   WBC 6.3 11/13/2018   HGB 15.0 11/13/2018   HCT 44.3 11/13/2018   PLT 196.0 11/13/2018   GLUCOSE 87 11/13/2018   CHOL 228 (H) 11/13/2018   TRIG 52.0 11/13/2018   HDL 94.20 11/13/2018   LDLDIRECT 128.8 02/23/2013   LDLCALC 123 (H) 11/13/2018   ALT 18 11/13/2018   AST 22 11/13/2018   NA 140 11/13/2018   K 4.1 11/13/2018   CL 102 11/13/2018   CREATININE 0.47 11/13/2018   BUN 12 11/13/2018   CO2 31 11/13/2018   TSH 1.14 11/13/2018   HGBA1C 5.3 11/13/2018   MICROALBUR 2.0 (H) 01/24/2009  BP Readings from Last 3 Encounters:  09/21/19 128/80  11/13/18 120/62  08/06/17 124/84    ASSESSMENT AND PLAN:  Discussed the following assessment and plan:  LLQ pain suspecious for diverticulitis  - Plan: Basic metabolic panel, CBC with Differential/Platelet, Hepatic function panel, Sedimentation rate, POCT Urinalysis Dipstick (Automated), CT Abdomen Pelvis W Contrast  Left lower quadrant abdominal pain - Plan: CT Abdomen Pelvis W Contrast, CANCELED: POCT urinalysis dipstick prob diverticulitis  " Not having colon" plan lab and scan  Today  Fu depending  -Patient advised to return or notify health care team  if  new concerns arise.  Patient Instructions   Glad  you are feeling better  Lab and ordered abd scan  prob diverticulitis .  Plan follow up depending .   Finish antibiotic  Let us know if problems in interim.     Neta Mends. Erika Hussar M.D.

## 2019-09-20 ENCOUNTER — Other Ambulatory Visit: Payer: Self-pay

## 2019-09-21 ENCOUNTER — Ambulatory Visit
Admission: RE | Admit: 2019-09-21 | Discharge: 2019-09-21 | Disposition: A | Discharge status: Home / Self Care | Payer: Medicare Other | Source: Ambulatory Visit | Attending: Internal Medicine | Admitting: Internal Medicine

## 2019-09-21 ENCOUNTER — Other Ambulatory Visit: Payer: Self-pay

## 2019-09-21 ENCOUNTER — Ambulatory Visit (INDEPENDENT_AMBULATORY_CARE_PROVIDER_SITE_OTHER): Payer: Medicare Other | Admitting: Internal Medicine

## 2019-09-21 ENCOUNTER — Encounter: Payer: Self-pay | Admitting: Internal Medicine

## 2019-09-21 VITALS — BP 128/80 | HR 73 | Temp 98.0°F | Ht 68.25 in | Wt 128.2 lb

## 2019-09-21 DIAGNOSIS — R1032 Left lower quadrant pain: Secondary | ICD-10-CM | POA: Diagnosis not present

## 2019-09-21 LAB — CBC WITH DIFFERENTIAL/PLATELET
Basophils Absolute: 0 10*3/uL (ref 0.0–0.1)
Basophils Relative: 0.7 % (ref 0.0–3.0)
Eosinophils Absolute: 0.1 10*3/uL (ref 0.0–0.7)
Eosinophils Relative: 2.9 % (ref 0.0–5.0)
HCT: 42.8 % (ref 36.0–46.0)
Hemoglobin: 14.7 g/dL (ref 12.0–15.0)
Lymphocytes Relative: 24.1 % (ref 12.0–46.0)
Lymphs Abs: 1.2 10*3/uL (ref 0.7–4.0)
MCHC: 34.2 g/dL (ref 30.0–36.0)
MCV: 92.2 fl (ref 78.0–100.0)
Monocytes Absolute: 0.5 10*3/uL (ref 0.1–1.0)
Monocytes Relative: 9.5 % (ref 3.0–12.0)
Neutro Abs: 3.2 10*3/uL (ref 1.4–7.7)
Neutrophils Relative %: 62.8 % (ref 43.0–77.0)
Platelets: 294 10*3/uL (ref 150.0–400.0)
RBC: 4.64 Mil/uL (ref 3.87–5.11)
RDW: 12.8 % (ref 11.5–15.5)
WBC: 5.1 10*3/uL (ref 4.0–10.5)

## 2019-09-21 LAB — POC URINALSYSI DIPSTICK (AUTOMATED)
Bilirubin, UA: NEGATIVE
Blood, UA: NEGATIVE
Glucose, UA: NEGATIVE
Ketones, UA: NEGATIVE
Leukocytes, UA: NEGATIVE
Nitrite, UA: NEGATIVE
Protein, UA: POSITIVE — AB
Spec Grav, UA: 1.015 (ref 1.010–1.025)
Urobilinogen, UA: 0.2 E.U./dL
pH, UA: 8 (ref 5.0–8.0)

## 2019-09-21 LAB — HEPATIC FUNCTION PANEL
ALT: 20 U/L (ref 0–35)
AST: 21 U/L (ref 0–37)
Albumin: 4.2 g/dL (ref 3.5–5.2)
Alkaline Phosphatase: 82 U/L (ref 39–117)
Bilirubin, Direct: 0.1 mg/dL (ref 0.0–0.3)
Total Bilirubin: 0.5 mg/dL (ref 0.2–1.2)
Total Protein: 7.1 g/dL (ref 6.0–8.3)

## 2019-09-21 LAB — BASIC METABOLIC PANEL
BUN: 11 mg/dL (ref 6–23)
CO2: 35 mEq/L — ABNORMAL HIGH (ref 19–32)
Calcium: 10 mg/dL (ref 8.4–10.5)
Chloride: 99 mEq/L (ref 96–112)
Creatinine, Ser: 0.51 mg/dL (ref 0.40–1.20)
GFR: 120.29 mL/min (ref >60.00)
Glucose, Bld: 94 mg/dL (ref 70–99)
Potassium: 4.7 mEq/L (ref 3.5–5.1)
Sodium: 139 mEq/L (ref 135–145)

## 2019-09-21 LAB — SEDIMENTATION RATE: Sed Rate: 23 mm/hr (ref 0–30)

## 2019-09-21 MED ORDER — IOPAMIDOL (ISOVUE-300) INJECTION 61%
100.0000 mL | Freq: Once | INTRAVENOUS | Status: AC | PRN
  Administered 2019-09-21: 100 mL via INTRAVENOUS

## 2019-09-21 NOTE — Patient Instructions (Signed)
  Glad  you are feeling better  Lab and ordered abd scan  prob diverticulitis .  Plan follow up depending .   Finish antibiotic  Let us know if problems in interim.

## 2019-09-22 ENCOUNTER — Telehealth: Payer: Self-pay | Admitting: Internal Medicine

## 2019-09-22 ENCOUNTER — Other Ambulatory Visit: Payer: Self-pay

## 2019-09-22 DIAGNOSIS — K4090 Unilateral inguinal hernia, without obstruction or gangrene, not specified as recurrent: Secondary | ICD-10-CM

## 2019-09-22 DIAGNOSIS — R1032 Left lower quadrant pain: Secondary | ICD-10-CM

## 2019-09-22 NOTE — Telephone Encounter (Signed)
Pt would like a call to go over lab results and message that she received from Dr. Fabian Sharp via Earleen Reaper.

## 2019-09-22 NOTE — Progress Notes (Signed)
So looks like very  mild diverticulitis but  unexpected findings  : a hernia  on the left  inguinal area but not sure causing  the problem .  most hernias should be repaired to preVent a problem  later . May we  do a surgical referral to evaluate the hernia?    Otherwise  if continuing to get better  then will follo wup at your summer visit

## 2019-09-22 NOTE — Telephone Encounter (Signed)
Called patient and gave her lab results. Patient verbalized an understanding. Surgery consult referral has been placed.

## 2019-09-22 NOTE — Progress Notes (Signed)
Blood owrk ok  mild protein in urine is a screen  and prob a temprary finding   we can repeat this  at nextvisit when better from the infecction

## 2019-11-15 NOTE — Progress Notes (Signed)
Chief Complaint  Patient presents with  . Annual Exam    Doing okay  . Shoulder Pain    right shoulder    HPI: Linda Richard 69 y.o. comes in today for Preventive Medicare exam  Doing ok   r shoulder . Bothering her for a while   Pain and some days  Better than others  Lasting for past year   Today is a good day   . Dec rom and hard to lift weight on right . No acute injury    Radiates down to elbow at times and some in back   hernia not bothering her and surgery  Saw and decided to wait and see if having any sx   No sx   Mood doing well onlexaproi per dr Billy [place] has check soon   Health Maintenance  Topic Date Due  . Hepatitis C Screening  Never done  . PNA vac Low Risk Adult (2 of 2 - PCV13) 11/13/2019  . INFLUENZA VACCINE  12/19/2019  . MAMMOGRAM  02/21/2021  . COLONOSCOPY  09/29/2024  . TETANUS/TDAP  04/03/2025  . DEXA SCAN  Completed  . COVID-19 Vaccine  Completed   Health Maintenance Review LIFESTYLE:  Exercise:  Very active  Tobacco/ETS:n Alcohol: ocass Sugar beverages:n Sleep:good Drug use: no HH:      Hearing: ok   Vision:  No limitations at present . Last eye check UTD  Safety:  Has smoke detector and wears seat belts.  No firearms. No excess sun exposure. Sees dentist regularly.  Falls: n  Memory: Felt to be good  , no concern from her or her family.  Depression: No anhedonia unusual crying or depressive symptoms  Nutrition: Eats well balanced diet; adequate calcium and vitamin D. No swallowing chewing problems.  Injury: no major injuries in the last six months.  Other healthcare providers:  Reviewed today .  Preventive parameters: up-to-date  Reviewed   ADLS:   There are no problems or need for assistance  driving, feeding, obtaining food, dressing, toileting and bathing, managing money using phone. She is independent.    ROS:  See hpi  GEN/ HEENT: No fever, significant weight changes sweats headaches vision problems hearing  changes, CV/ PULM; No chest pain shortness of breath cough, syncope,edema  change in exercise tolerance. GI /GU: No adominal pain, vomiting, change in bowel habits. No blood in the stool. No significant GU symptoms. SKIN/HEME: ,no acute skin rashes suspicious lesions or bleeding. No lymphadenopathy, nodules, masses.  NEURO/ PSYCH:  No neurologic signs such as weakness numbness. No depression anxiety. IMM/ Allergy: No unusual infections.  Allergy .   REST of 12 system review negative except as per HPI   Past Medical History:  Diagnosis Date  . Anxiety    takes Lexapro daily-just started 01/19/15  . BACK PAIN 01/24/2009   Qualifier: Diagnosis of  By: Fabian Sharp MD, Neta Mends Uses inversion therapy and doing very well   . Diverticulitis of intestine with perforation 10/06/2014  . DVT (deep vein thrombosis) in pregnancy 1983   behind left leg  . History of kidney stones 2011  . History of shingles   . Joint pain   . NEPHROLITHIASIS 05/02/2010   Qualifier: Diagnosis of  By: Fabian Sharp MD, Neta Mends   . Pneumonia as a child  . Urinary frequency   . Urinary urgency    just started taking Myrbetriq  . Varicose veins    strip and laser    Family History  Problem Relation Age of Onset  . Hypertension Other   . Kidney disease Other        renal stones  . Arthritis Other   . Cancer Father        bladder   23s   . Ovarian cancer Mother        43s  . Other Other        son age 57 irreg HB  had cardioversion   . Thyroid disease Other   . Colon polyps Neg Hx   . Colon cancer Neg Hx   . Rectal cancer Neg Hx   . Stomach cancer Neg Hx     Social History   Socioeconomic History  . Marital status: Married    Spouse name: Not on file  . Number of children: Not on file  . Years of education: Not on file  . Highest education level: Not on file  Occupational History  . Not on file  Tobacco Use  . Smoking status: Never Smoker  . Smokeless tobacco: Never Used  Vaping Use  . Vaping Use: Never used   Substance and Sexual Activity  . Alcohol use: Yes    Alcohol/week: 0.0 standard drinks    Comment: 3 x a week, mostly wine no t daily]  . Drug use: No  . Sexual activity: Yes    Birth control/protection: Post-menopausal  Other Topics Concern  . Not on file  Social History Narrative   HH of 2    Grandchild  Sons    reitred [employer] Child Support   Married.    No  ets tobacco No  Exercise restriction   etoh 2 x per week.    Exercise good.       G3P2   Social Determinants of Health   Financial Resource Strain:   . Difficulty of Paying Living Expenses:   Food Insecurity:   . Worried About Charity fundraiser in the Last Year:   . Arboriculturist in the Last Year:   Transportation Needs:   . Film/video editor (Medical):   Marland Kitchen Lack of Transportation (Non-Medical):   Physical Activity:   . Days of Exercise per Week:   . Minutes of Exercise per Session:   Stress:   . Feeling of Stress :   Social Connections:   . Frequency of Communication with Friends and Family:   . Frequency of Social Gatherings with Friends and Family:   . Attends Religious Services:   . Active Member of Clubs or Organizations:   . Attends Archivist Meetings:   Marland Kitchen Marital Status:     Outpatient Encounter Medications as of 11/16/2019  Medication Sig  . Acetaminophen (TYLENOL ARTHRITIS PAIN PO) Take 800 mg by mouth daily as needed.  Marland Kitchen aspirin 81 MG tablet Take 81 mg by mouth daily.    . busPIRone (BUSPAR) 7.5 MG tablet Take 7.5 mg twice daily  . calcium-vitamin D (OSCAL WITH D) 500-200 MG-UNIT per tablet Take 1 tablet by mouth daily.    Marland Kitchen escitalopram (LEXAPRO) 10 MG tablet Take 10 mg orally daily  . estradiol (ESTRACE) 0.1 MG/GM vaginal cream Place 1 g vaginally 2 (two) times a week. Monday and Friday  . Multiple Vitamins-Minerals (ONE DAILY WOMENS 50+ PO) Take 1 tablet by mouth daily.  . Probiotic Product (PROBIOTIC DAILY PO) Take by mouth.  . tretinoin (RETIN-A) 0.025 % cream as needed.  .  [DISCONTINUED] amoxicillin-clavulanate (AUGMENTIN) 875-125 MG tablet Take 1 tablet  by mouth every 12 (twelve) hours. (Patient not taking: Reported on 11/16/2019)   No facility-administered encounter medications on file as of 11/16/2019.    EXAM:  BP 128/78   Pulse 70   Temp (!) 97.3 F (36.3 C) (Temporal)   Ht 5' 8.25" (1.734 m)   Wt 126 lb 12.8 oz (57.5 kg)   SpO2 97%   BMI 19.14 kg/m   Body mass index is 19.14 kg/m.  Physical Exam: Vital signs reviewed RCV:ELFY is a well-developed well-nourished alert cooperative   who appears stated age in no acute distress.  Slender habitus  HEENT: normocephalic atraumatic , Eyes: PERRL EOM's full, conjunctiva clear, Nares: paten,t no deformity discharge or tenderness., Ears: no deformity EAC's clear TMs with normal landmarks. Mouth: masked NECK: supple without masses, thyromegaly or bruits. CHEST/PULM:  Clear to auscultation and percussion breath sounds equal no wheeze , rales or rhonchi. No chest wall deformities or tenderness. CV: PMI is nondisplaced, S1 S2 no gallops, murmurs, rubs. Peripheral pulses are full without delay.No JVD .  Breast: normal by inspection . No dimpling, discharge, masses, tenderness or discharge . ABDOMEN: Bowel sounds normal nontender  No guard or rebound, no hepato splenomegal no CVA tenderness.   Left  Lower abd hernia  Non tender   varicose veins  Extremtities:  No clubbing cyanosis or edema, no acute joint swelling or redness no focal atrophy right shoulder some dec rom internal rotation  NEURO:  Oriented x3, cranial nerves 3-12 appear to be intact, no obvious focal weakness,gait within normal limits no abnormal reflexes or asymmetrical SKIN: No acute rashes normal turgor, color, no bruising or petechiae. PSYCH: Oriented, good eye contact, no obvious depression anxiety, cognition and judgment appear normal. LN: no cervical axillary inguinal adenopathy No noted deficits in memory, attention, and speech.   Lab  Results  Component Value Date   WBC 5.1 09/21/2019   HGB 14.7 09/21/2019   HCT 42.8 09/21/2019   PLT 294.0 09/21/2019   GLUCOSE 94 09/21/2019   CHOL 228 (H) 11/13/2018   TRIG 52.0 11/13/2018   HDL 94.20 11/13/2018   LDLDIRECT 128.8 02/23/2013   LDLCALC 123 (H) 11/13/2018   ALT 20 09/21/2019   AST 21 09/21/2019   NA 139 09/21/2019   K 4.7 09/21/2019   CL 99 09/21/2019   CREATININE 0.51 09/21/2019   BUN 11 09/21/2019   CO2 35 (H) 09/21/2019   TSH 1.14 11/13/2018   HGBA1C 5.3 11/13/2018   MICROALBUR 2.0 (H) 01/24/2009   Last poct ua neg x prot screen  ASSESSMENT AND PLAN:  Discussed the following assessment and plan:  Visit for preventive health examination  Proteinuria, unspecified type - Plan: Microalbumin / creatinine urine ratio  Pain in joint of right shoulder - Plan: Ambulatory referral to Sports Medicine Disc  options   Referral to SM  Interfering  Can try topical Voltaren  considier getting lipid panel  Next year   Favorable profile  Patient Care Team: Madelin Headings, MD as PCP - General Olivia Mackie, MD (Obstetrics and Gynecology) Loletha Carrow, MD as Consulting Physician (Ophthalmology) Elesa Hacker, MD as Referring Physician (Dermatology) Lynden Ang, NP as Nurse Practitioner (Obstetrics and Gynecology)  Patient Instructions  You will be contacted about  Referral for shoulder pain.   Will notify you  of labs when available.   Glad you are doing well.     Health Maintenance, Female Adopting a healthy lifestyle and getting preventive care are important in promoting health and wellness.  Ask your health care provider about:  The right schedule for you to have regular tests and exams.  Things you can do on your own to prevent diseases and keep yourself healthy. What should I know about diet, weight, and exercise? Eat a healthy diet   Eat a diet that includes plenty of vegetables, fruits, low-fat dairy products, and lean  protein.  Do not eat a lot of foods that are high in solid fats, added sugars, or sodium. Maintain a healthy weight Body mass index (BMI) is used to identify weight problems. It estimates body fat based on height and weight. Your health care provider can help determine your BMI and help you achieve or maintain a healthy weight. Get regular exercise Get regular exercise. This is one of the most important things you can do for your health. Most adults should:  Exercise for at least 150 minutes each week. The exercise should increase your heart rate and make you sweat (moderate-intensity exercise).  Do strengthening exercises at least twice a week. This is in addition to the moderate-intensity exercise.  Spend less time sitting. Even light physical activity can be beneficial. Watch cholesterol and blood lipids Have your blood tested for lipids and cholesterol at 67 years of age, then have this test every 5 years. Have your cholesterol levels checked more often if:  Your lipid or cholesterol levels are high.  You are older than 67 years of age.  You are at high risk for heart disease. What should I know about cancer screening? Depending on your health history and family history, you may need to have cancer screening at various ages. This may include screening for:  Breast cancer.  Cervical cancer.  Colorectal cancer.  Skin cancer.  Lung cancer. What should I know about heart disease, diabetes, and high blood pressure? Blood pressure and heart disease  High blood pressure causes heart disease and increases the risk of stroke. This is more likely to develop in people who have high blood pressure readings, are of African descent, or are overweight.  Have your blood pressure checked: ? Every 3-5 years if you are 52-21 years of age. ? Every year if you are 75 years old or older. Diabetes Have regular diabetes screenings. This checks your fasting blood sugar level. Have the screening  done:  Once every three years after age 83 if you are at a normal weight and have a low risk for diabetes.  More often and at a younger age if you are overweight or have a high risk for diabetes. What should I know about preventing infection? Hepatitis B If you have a higher risk for hepatitis B, you should be screened for this virus. Talk with your health care provider to find out if you are at risk for hepatitis B infection. Hepatitis C Testing is recommended for:  Everyone born from [birth year] through 1965.  Anyone with known risk factors for hepatitis C. Sexually transmitted infections (STIs)  Get screened for STIs, including gonorrhea and chlamydia, if: ? You are sexually active and are younger than 67 years of age. ? You are older than 67 years of age and your health care provider tells you that you are at risk for this type of infection. ? Your sexual activity has changed since you were last screened, and you are at increased risk for chlamydia or gonorrhea. Ask your health care provider if you are at risk.  Ask your health care provider about whether you are at high risk  for HIV. Your health care provider may recommend a prescription medicine to help prevent HIV infection. If you choose to take medicine to prevent HIV, you should first get tested for HIV. You should then be tested every 3 months for as long as you are taking the medicine. Pregnancy  If you are about to stop having your period (premenopausal) and you may become pregnant, seek counseling before you get pregnant.  Take 400 to 800 micrograms (mcg) of folic acid every day if you become pregnant.  Ask for birth control (contraception) if you want to prevent pregnancy. Osteoporosis and menopause Osteoporosis is a disease in which the bones lose minerals and strength with aging. This can result in bone fractures. If you are 67 years old or older, or if you are at risk for osteoporosis and fractures, ask your health care  provider if you should:  Be screened for bone loss.  Take a calcium or vitamin D supplement to lower your risk of fractures.  Be given hormone replacement therapy (HRT) to treat symptoms of menopause. Follow these instructions at home: Lifestyle  Do not use any products that contain nicotine or tobacco, such as cigarettes, e-cigarettes, and chewing tobacco. If you need help quitting, ask your health care provider.  Do not use street drugs.  Do not share needles.  Ask your health care provider for help if you need support or information about quitting drugs. Alcohol use  Do not drink alcohol if: ? Your health care provider tells you not to drink. ? You are pregnant, may be pregnant, or are planning to become pregnant.  If you drink alcohol: ? Limit how much you use to 0-1 drink a day. ? Limit intake if you are breastfeeding.  Be aware of how much alcohol is in your drink. In the [place] one drink equals one 12 oz bottle of beer (355 mL), one 5 oz glass of wine (148 mL), or one 1 oz glass of hard liquor (44 mL). General instructions  Schedule regular health, dental, and eye exams.  Stay current with your vaccines.  Tell your health care provider if: ? You often feel depressed. ? You have ever been abused or do not feel safe at home. Summary  Adopting a healthy lifestyle and getting preventive care are important in promoting health and wellness.  Follow your health care provider's instructions about healthy diet, exercising, and getting tested or screened for diseases.  Follow your health care provider's instructions on monitoring your cholesterol and blood pressure. This information is not intended to replace advice given to you by your health care provider. Make sure you discuss any questions you have with your health care provider. Document Revised: 04/29/2018 Document Reviewed: 04/29/2018 Elsevier Patient Education  2020 [employer]  The 10-year ASCVD risk score  Denman George(Goff [place] Montez HagemanJr., et al., 2013) is: 6.1%   Values used to calculate the score:     Age: [age] years     Sex: Female     Is Non-Hispanic African American: No     Diabetic: No     Tobacco smoker: No     Systolic Blood Pressure: 128 mmHg     Is BP treated: No     HDL Cholesterol: 94.2 mg/dL     Total Cholesterol: 228 mg/dL      Neta MendsWanda K. Phi Richard M.D.

## 2019-11-16 ENCOUNTER — Ambulatory Visit (INDEPENDENT_AMBULATORY_CARE_PROVIDER_SITE_OTHER): Payer: Medicare Other | Admitting: Internal Medicine

## 2019-11-16 ENCOUNTER — Other Ambulatory Visit: Payer: Self-pay

## 2019-11-16 ENCOUNTER — Encounter: Payer: Self-pay | Admitting: Internal Medicine

## 2019-11-16 VITALS — BP 128/78 | HR 70 | Temp 97.3°F | Ht 68.25 in | Wt 126.8 lb

## 2019-11-16 DIAGNOSIS — R809 Proteinuria, unspecified: Secondary | ICD-10-CM | POA: Diagnosis not present

## 2019-11-16 DIAGNOSIS — M25511 Pain in right shoulder: Secondary | ICD-10-CM | POA: Diagnosis not present

## 2019-11-16 DIAGNOSIS — Z0000 Encounter for general adult medical examination without abnormal findings: Secondary | ICD-10-CM

## 2019-11-16 LAB — MICROALBUMIN / CREATININE URINE RATIO
Creatinine,U: 40.7 mg/dL
Microalb Creat Ratio: 1.7 mg/g (ref 0.0–30.0)
Microalb, Ur: <0.7 mg/dL (ref 0.0–1.9)

## 2019-11-16 NOTE — Patient Instructions (Signed)
You will be contacted about  Referral for shoulder pain.   Will notify you  of labs when available.   Glad you are doing well.     Health Maintenance, Female Adopting a healthy lifestyle and getting preventive care are important in promoting health and wellness. Ask your health care provider about:  The right schedule for you to have regular tests and exams.  Things you can do on your own to prevent diseases and keep yourself healthy. What should I know about diet, weight, and exercise? Eat a healthy diet   Eat a diet that includes plenty of vegetables, fruits, low-fat dairy products, and lean protein.  Do not eat a lot of foods that are high in solid fats, added sugars, or sodium. Maintain a healthy weight Body mass index (BMI) is used to identify weight problems. It estimates body fat based on height and weight. Your health care provider can help determine your BMI and help you achieve or maintain a healthy weight. Get regular exercise Get regular exercise. This is one of the most important things you can do for your health. Most adults should:  Exercise for at least 150 minutes each week. The exercise should increase your heart rate and make you sweat (moderate-intensity exercise).  Do strengthening exercises at least twice a week. This is in addition to the moderate-intensity exercise.  Spend less time sitting. Even light physical activity can be beneficial. Watch cholesterol and blood lipids Have your blood tested for lipids and cholesterol at 67 years of age, then have this test every 5 years. Have your cholesterol levels checked more often if:  Your lipid or cholesterol levels are high.  You are older than 67 years of age.  You are at high risk for heart disease. What should I know about cancer screening? Depending on your health history and family history, you may need to have cancer screening at various ages. This may include screening for:  Breast  cancer.  Cervical cancer.  Colorectal cancer.  Skin cancer.  Lung cancer. What should I know about heart disease, diabetes, and high blood pressure? Blood pressure and heart disease  High blood pressure causes heart disease and increases the risk of stroke. This is more likely to develop in people who have high blood pressure readings, are of African descent, or are overweight.  Have your blood pressure checked: ? Every 3-5 years if you are 31-59 years of age. ? Every year if you are 51 years old or older. Diabetes Have regular diabetes screenings. This checks your fasting blood sugar level. Have the screening done:  Once every three years after age 28 if you are at a normal weight and have a low risk for diabetes.  More often and at a younger age if you are overweight or have a high risk for diabetes. What should I know about preventing infection? Hepatitis B If you have a higher risk for hepatitis B, you should be screened for this virus. Talk with your health care provider to find out if you are at risk for hepatitis B infection. Hepatitis C Testing is recommended for:  Everyone born from [birth year] through 1963.  Anyone with known risk factors for hepatitis C. Sexually transmitted infections (STIs)  Get screened for STIs, including gonorrhea and chlamydia, if: ? You are sexually active and are younger than 67 years of age. ? You are older than 67 years of age and your health care provider tells you that you are at risk for  this type of infection. ? Your sexual activity has changed since you were last screened, and you are at increased risk for chlamydia or gonorrhea. Ask your health care provider if you are at risk.  Ask your health care provider about whether you are at high risk for HIV. Your health care provider may recommend a prescription medicine to help prevent HIV infection. If you choose to take medicine to prevent HIV, you should first get tested for HIV. You should  then be tested every 3 months for as long as you are taking the medicine. Pregnancy  If you are about to stop having your period (premenopausal) and you may become pregnant, seek counseling before you get pregnant.  Take 400 to 800 micrograms (mcg) of folic acid every day if you become pregnant.  Ask for birth control (contraception) if you want to prevent pregnancy. Osteoporosis and menopause Osteoporosis is a disease in which the bones lose minerals and strength with aging. This can result in bone fractures. If you are 29 years old or older, or if you are at risk for osteoporosis and fractures, ask your health care provider if you should:  Be screened for bone loss.  Take a calcium or vitamin D supplement to lower your risk of fractures.  Be given hormone replacement therapy (HRT) to treat symptoms of menopause. Follow these instructions at home: Lifestyle  Do not use any products that contain nicotine or tobacco, such as cigarettes, e-cigarettes, and chewing tobacco. If you need help quitting, ask your health care provider.  Do not use street drugs.  Do not share needles.  Ask your health care provider for help if you need support or information about quitting drugs. Alcohol use  Do not drink alcohol if: ? Your health care provider tells you not to drink. ? You are pregnant, may be pregnant, or are planning to become pregnant.  If you drink alcohol: ? Limit how much you use to 0-1 drink a day. ? Limit intake if you are breastfeeding.  Be aware of how much alcohol is in your drink. In the [place] one drink equals one 12 oz bottle of beer (355 mL), one 5 oz glass of wine (148 mL), or one 1 oz glass of hard liquor (44 mL). General instructions  Schedule regular health, dental, and eye exams.  Stay current with your vaccines.  Tell your health care provider if: ? You often feel depressed. ? You have ever been abused or do not feel safe at home. Summary  Adopting a  healthy lifestyle and getting preventive care are important in promoting health and wellness.  Follow your health care provider's instructions about healthy diet, exercising, and getting tested or screened for diseases.  Follow your health care provider's instructions on monitoring your cholesterol and blood pressure. This information is not intended to replace advice given to you by your health care provider. Make sure you discuss any questions you have with your health care provider. Document Revised: 04/29/2018 Document Reviewed: 04/29/2018 Elsevier Patient Education  2020 [employer].  The 10-year ASCVD risk score Denman George DC Montez Hageman., et al., 2013) is: 6.1%   Values used to calculate the score:     Age: 73 years     Sex: Female     Is Non-Hispanic African American: No     Diabetic: No     Tobacco smoker: No     Systolic Blood Pressure: 128 mmHg     Is BP treated: No  HDL Cholesterol: 94.2 mg/dL     Total Cholesterol: 228 mg/dL

## 2019-11-17 NOTE — Progress Notes (Signed)
No significant protein in urine  this is normal and no follow up needed

## 2019-11-19 ENCOUNTER — Encounter: Payer: Self-pay | Admitting: Family Medicine

## 2019-11-19 ENCOUNTER — Ambulatory Visit: Payer: Self-pay

## 2019-11-19 ENCOUNTER — Ambulatory Visit (INDEPENDENT_AMBULATORY_CARE_PROVIDER_SITE_OTHER): Payer: Medicare Other

## 2019-11-19 ENCOUNTER — Ambulatory Visit (INDEPENDENT_AMBULATORY_CARE_PROVIDER_SITE_OTHER): Payer: Medicare Other | Admitting: Family Medicine

## 2019-11-19 ENCOUNTER — Other Ambulatory Visit: Payer: Self-pay

## 2019-11-19 VITALS — BP 136/80 | HR 67 | Ht 68.25 in | Wt 128.4 lb

## 2019-11-19 DIAGNOSIS — M25511 Pain in right shoulder: Secondary | ICD-10-CM

## 2019-11-19 DIAGNOSIS — G8929 Other chronic pain: Secondary | ICD-10-CM

## 2019-11-19 NOTE — Progress Notes (Signed)
Subjective:    CC: R shoulder pain  I, Molly Weber, LAT, ATC, am serving as scribe for Dr. Clementeen Graham.  HPI: Pt is a 69 y/o female presenting w/ c/o chronic R shoulder pain x approximately one year w/ no specific MOI but does report that she's fallen a few times while hiking.  She locates her pain to her R superior shoulder that radiates into her clavicular area and along her post shoulder .  Radiating pain: yes into R post arm and into her R clavicle R shoulder mechanical symptoms: yes Aggravating factors: sitting w/ her arm resting on an armrest; repetitive motions such as chopping vegetables or cleaning the shower Treatments tried: stretching; IBU; CBD cream  Pertinent review of Systems: No fevers or chills  Relevant historical information: History of DVT   Objective:    Vitals:   11/19/19 1107  BP: 136/80  Pulse: 67  SpO2: 98%   General: Well Developed, well nourished, and in no acute distress.   MSK: Right shoulder normal-appearing Range of motion: Normal abduction.  Normal external rotation.  Internal rotation to lumbar spine. Intact strength. Positive Hawkins and Neer's test.  Positive empty can test. Mildly positive Yergason's and speeds test. Pulses capillary fill and sensation are intact distally.  Lab and Radiology Results  X-ray images right shoulder obtained today personally and independently reviewed No fractures.  Minimal degenerative changes.  Possible costo chondroma in humeral metaphysis Await formal radiology review  Diagnostic Limited MSK Ultrasound of: Right shoulder Biceps tendon is intact.  Mild hypoechoic fluid within tendon mid substance indicating tendinitis without tear. Subscapularis tendon intact and normal appearing Supraspinatus tendon is intact.  Increased subacromial bursa thickness present. Infraspinatus tendon normal-appearing AC joint degenerative with effusion. Impression: Subacromial bursitis.  AC DJD.  Mild biceps  tendinitis.    Impression and Recommendations:    Assessment and Plan: 69 y.o. female with right shoulder pain due to subacromial bursitis.  It is possible she may have a small bursal surface tear this not fully visualized with ultrasound as well.  Plan for physical therapy and diclofenac gel.  Check back in 6 weeks if not better next step would be injection or MRI.Marland Kitchen  PDMP not reviewed this encounter. Orders Placed This Encounter  Procedures  . [place] LIMITED JOINT SPACE STRUCTURES UP RIGHT(NO LINKED CHARGES)    Order Specific Question:   Reason for Exam (SYMPTOM  OR DIAGNOSIS REQUIRED)    Answer:   R shoulder pain    Order Specific Question:   Preferred imaging location?    Answer:   [facility]  . DG Shoulder Right    Standing Status:   Future    Number of Occurrences:   1    Standing Expiration Date:   11/18/2020    Order Specific Question:   Reason for Exam (SYMPTOM  OR DIAGNOSIS REQUIRED)    Answer:   eval shoulder pain. hx fall    Order Specific Question:   Preferred imaging location?    Answer:   [facility]    Order Specific Question:   Radiology Contrast Protocol - do NOT remove file path    Answer:   [email or web address]  . Ambulatory referral to Physical Therapy    Referral Priority:   Routine    Referral Type:   Physical Medicine    Referral Reason:   Specialty Services Required    Requested Specialty:   Physical Therapy   No orders of the defined types  were placed in this encounter.   Discussed warning signs or symptoms. Please see discharge instructions. Patient expresses understanding.   The above documentation has been reviewed and is accurate and complete Lynne Leader, M.D.

## 2019-11-19 NOTE — Patient Instructions (Addendum)
Thank you for coming in today. Plan for PT.  Get xray today.  Recheck in 6 weeks especially if not improving.  Next step is injection or MRI.    Shoulder Impingement Syndrome Rehab Ask your health care provider which exercises are safe for you. Do exercises exactly as told by your health care provider and adjust them as directed. It is normal to feel mild stretching, pulling, tightness, or discomfort as you do these exercises. Stop right away if you feel sudden pain or your pain gets worse. Do not begin these exercises until told by your health care provider. Stretching and range-of-motion exercise This exercise warms up your muscles and joints and improves the movement and flexibility of your shoulder. This exercise also helps to relieve pain and stiffness. Passive horizontal adduction In passive adduction, you use your other hand to move the injured arm toward your body. The injured arm does not move on its own. In this movement, your arm is moved across your body in the horizontal plane (horizontal adduction). 1. Sit or stand and pull your left / right elbow across your chest, toward your other shoulder. Stop when you feel a gentle stretch in the back of your shoulder and upper arm. ? Keep your arm at shoulder height. ? Keep your arm as close to your body as you comfortably can. 2. Hold for __________ seconds. 3. Slowly return to the starting position. Repeat __________ times. Complete this exercise __________ times a day. Strengthening exercises These exercises build strength and endurance in your shoulder. Endurance is the ability to use your muscles for a long time, even after they get tired. External rotation, isometric This is an exercise in which you press the back of your wrist against a door frame without moving your shoulder joint (isometric). 1. Stand or sit in a doorway, facing the door frame. 2. Bend your left / right elbow and place the back of your wrist against the door  frame. Only the back of your wrist should be touching the frame. Keep your upper arm at your side. 3. Gently press your wrist against the door frame, as if you are trying to push your arm away from your abdomen (external rotation). Press as hard as you are able without pain. ? Avoid shrugging your shoulder while you press your wrist against the door frame. Keep your shoulder blade tucked down toward the middle of your back. 4. Hold for __________ seconds. 5. Slowly release the tension, and relax your muscles completely before you repeat the exercise. Repeat __________ times. Complete this exercise __________ times a day. Internal rotation, isometric This is an exercise in which you press your palm against a door frame without moving your shoulder joint (isometric). 1. Stand or sit in a doorway, facing the door frame. 2. Bend your left / right elbow and place the palm of your hand against the door frame. Only your palm should be touching the frame. Keep your upper arm at your side. 3. Gently press your hand against the door frame, as if you are trying to push your arm toward your abdomen (internal rotation). Press as hard as you are able without pain. ? Avoid shrugging your shoulder while you press your hand against the door frame. Keep your shoulder blade tucked down toward the middle of your back. 4. Hold for __________ seconds. 5. Slowly release the tension, and relax your muscles completely before you repeat the exercise. Repeat __________ times. Complete this exercise __________ times a day. Scapular  protraction, supine  1. Lie on your back on a firm surface (supine position). Hold a __________ weight in your left / right hand. 2. Raise your left / right arm straight into the air so your hand is directly above your shoulder joint. 3. Push the weight into the air so your shoulder (scapula) lifts off the surface that you are lying on. The scapula will push up or forward (protraction). Do not  move your head, neck, or back. 4. Hold for __________ seconds. 5. Slowly return to the starting position. Let your muscles relax completely before you repeat this exercise. Repeat __________ times. Complete this exercise __________ times a day. Scapular retraction  1. Sit in a stable chair without armrests, or stand up. 2. Secure an exercise band to a stable object in front of you so the band is at shoulder height. 3. Hold one end of the exercise band in each hand. Your palms should face down. 4. Squeeze your shoulder blades together (retraction) and move your elbows slightly behind you. Do not shrug your shoulders upward while you do this. 5. Hold for __________ seconds. 6. Slowly return to the starting position. Repeat __________ times. Complete this exercise __________ times a day. Shoulder extension  1. Sit in a stable chair without armrests, or stand up. 2. Secure an exercise band to a stable object in front of you so the band is above shoulder height. 3. Hold one end of the exercise band in each hand. 4. Straighten your elbows and lift your hands up to shoulder height. 5. Squeeze your shoulder blades together and pull your hands down to the sides of your thighs (extension). Stop when your hands are straight down by your sides. Do not let your hands go behind your body. 6. Hold for __________ seconds. 7. Slowly return to the starting position. Repeat __________ times. Complete this exercise __________ times a day. This information is not intended to replace advice given to you by your health care provider. Make sure you discuss any questions you have with your health care provider. Document Revised: 08/28/2018 Document Reviewed: 06/01/2018 Elsevier Patient Education  2020 [employer].

## 2019-11-23 NOTE — Progress Notes (Signed)
No fracture or dislocation.  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  This is not cancerous and does not need significant further work-up likely.

## 2019-12-03 ENCOUNTER — Other Ambulatory Visit: Payer: Self-pay

## 2019-12-03 ENCOUNTER — Encounter: Payer: Self-pay | Admitting: Physical Therapy

## 2019-12-03 ENCOUNTER — Ambulatory Visit (INDEPENDENT_AMBULATORY_CARE_PROVIDER_SITE_OTHER): Payer: Medicare Other | Admitting: Physical Therapy

## 2019-12-03 DIAGNOSIS — M25511 Pain in right shoulder: Secondary | ICD-10-CM

## 2019-12-03 DIAGNOSIS — G8929 Other chronic pain: Secondary | ICD-10-CM

## 2019-12-03 NOTE — Patient Instructions (Signed)
Access Code: [email or web address] URL: [email or web address] Date: 12/03/2019 Prepared by: Lulu Riding  Exercises Seated Upper Trapezius Stretch - 1 x daily - 7 x weekly - 2 reps - 2 sets - 30 seconds hold Gentle Levator Scapulae Stretch - 1 x daily - 7 x weekly - 2 reps - 2 sets - 30 seconds hold Shoulder Internal Rotation - 1 x daily - 7 x weekly - 2 sets - 10 reps Shoulder External Rotation - 1 x daily - 7 x weekly - 2 sets - 10 reps Standing Shoulder Row with Anchored Resistance - 1 x daily - 7 x weekly - 2 sets - 10 reps Doorway Pec Stretch at 90 Degrees Abduction - 2 x daily - 7 x weekly - 2 reps - 2 sets - 30 seconds hold Wall Push Up with Plus - 1 x daily - 7 x weekly - 10 reps - 2 sets - 5 hold Lower Trap Wall slides - 1 x daily - 7 x weekly - 2 sets - 10 reps

## 2019-12-03 NOTE — Therapy (Signed)
[facility] [address] [place], [place], [address] Phone: [phone]   Fax:  [phone]  Physical Therapy Evaluation  Patient Details  Name: Linda Richard MRN: [id number] Date of Birth: 12-07-1950 Referring Provider (PT): Clementeen Graham MD   Encounter Date: 12/03/2019   PT End of Session - 12/03/19 1614    Visit Number 1    Number of Visits 7    Date for PT Re-Evaluation 01/14/20    PT Start Time 1515    PT Stop Time 1601    PT Time Calculation (min) 46 min    Activity Tolerance Patient tolerated treatment well    Behavior During Therapy [facility] for tasks assessed/performed           Past Medical History:  Diagnosis Date  . Anxiety    takes Lexapro daily-just started 01/19/15  . BACK PAIN 01/24/2009   Qualifier: Diagnosis of  By: Fabian Sharp MD, Neta Mends Uses inversion therapy and doing very well   . Diverticulitis of intestine with perforation 10/06/2014  . DVT (deep vein thrombosis) in pregnancy 1983   behind left leg  . History of kidney stones 2011  . History of shingles   . Joint pain   . NEPHROLITHIASIS 05/02/2010   Qualifier: Diagnosis of  By: Fabian Sharp MD, Neta Mends   . Pneumonia as a child  . Urinary frequency   . Urinary urgency    just started taking Myrbetriq  . Varicose veins    strip and laser    Past Surgical History:  Procedure Laterality Date  . CESAREAN SECTION  1983  . CHOLECYSTECTOMY N/A 01/30/2015   Procedure: LAPAROSCOPIC CHOLECYSTECTOMY;  Surgeon: Emelia Loron, MD;  Location: [facility] OR;  Service: General;  Laterality: N/A;  . COLONOSCOPY    . TONSILLECTOMY  as a child  . veins stripped  80's   laser RX     There were no vitals filed for this visit.    Subjective Assessment - 12/03/19 1520    Subjective pt is a 69 y.o F With CC of R shoulder pain that started around a year ago. She hikes alot and states she did fall a couple times and she is unsure if that contributed to the pain. She reports no specific MOI  aside from being active. She reports the pain fluctuates, and stays in the shouder with occasional referral down the back of the arm to the elbow. Since onset she notes the pain seems to be improvement, and denies any PMHx regarding the R shoulder.    How long can you sit comfortably? unlimited    How long can you stand comfortably? unlimited    How long can you walk comfortably? unlimited    Diagnostic tests 11/19/2019 x-ray  1. No acute fracture or dislocation.2. Sclerotic area within the proximal shaft of the right humeruswhich may represent a benign enchondroma.    Patient Stated Goals to not make it worse, and maintain it    Currently in Pain? Yes    Pain Score 3    at worst 8/10   Pain Location Shoulder    Pain Orientation Right;Anterior;Lateral    Pain Type Chronic pain    Pain Radiating Towards down the back of the arm    Pain Onset More than a month ago    Pain Frequency Intermittent    Aggravating Factors  reaching forward    Pain Relieving Factors stretching, ibuprofen, CBD  [facility] PT Assessment - 12/03/19 1528      Assessment   Medical Diagnosis Chronic right shoulder pain    Referring Provider (PT) Clementeen Graham MD    Onset Date/Surgical Date --   over a year ago   Hand Dominance Right    Next MD Visit 01/06/2020    Prior Therapy yes      Precautions   Precautions None      Restrictions   Weight Bearing Restrictions No      Balance Screen   Has the patient fallen in the past 6 months No    Has the patient had a decrease in activity level because of a fear of falling?  No    Is the patient reluctant to leave their home because of a fear of falling?  No      Home [employer]ist information centre manager residence    Living Arrangements Spouse/significant other    Available Help at Discharge Family    Type of Home House    Home Access Stairs to enter    Entrance Stairs-Number of Steps 8    Entrance Stairs-Rails Cannot reach both    Home Layout Two  level    Alternate Level Stairs-Number of Steps 12    Alternate Level Stairs-Rails Right   and wall     Prior Function   Level of Independence Independent    Vocation Retired    Leisure hiking, gardening, walking      Cognition   Overall Cognitive Status Within Functional Limits for tasks assessed      Observation/Other Assessments   Observations scapular dyskinesia Type 1      ROM / Strength   AROM / PROM / Strength AROM;Strength      AROM   AROM Assessment Site Shoulder    Right/Left Shoulder Right;Left    Right Shoulder Internal Rotation --   T7  noted increased stiffness   Right Shoulder External Rotation --   T2   Left Shoulder Internal Rotation --   T3   Left Shoulder External Rotation --   T2     Strength   Strength Assessment Site Shoulder    Right/Left Shoulder Right;Left    Right Shoulder Flexion 4/5   concorant pain during testing   Right Shoulder Extension 4+/5    Right Shoulder ABduction 4/5   concorant pain during testing   Right Shoulder Internal Rotation 4+/5   concorant pain during testing   Right Shoulder External Rotation 4+/5    Left Shoulder Flexion 4/5    Left Shoulder Extension 4+/5    Left Shoulder ABduction 4/5    Left Shoulder Internal Rotation 4+/5    Left Shoulder External Rotation 4+/5      Palpation   Palpation comment TTP along infraspinatus and upper trap / levator scapulae with multiple trigger points noted.  and soreness noted in the sub-acromial space      Special Tests    Special Tests Rotator Cuff Impingement    Rotator Cuff Impingment tests Neer impingement test;Hawkins- Kennedy test;other;Full Can test;Empty Can test      Neer Impingement test    Findings Negative      Hawkins-Kennedy test   Findings Positive    Side Right      Empty Can test   Findings Negative    Side Right      Full Can test   Findings Positive    Side Right      other  Findings Positive    Side Right    Comments scapular assist test                       Objective measurements completed on examination: See above findings.       [facility] Adult PT Treatment/Exercise - 12/03/19 0001      Exercises   Exercises Shoulder      Shoulder Exercises: Standing   Other Standing Exercises lower trap wall y's 1 x 10    Other Standing Exercises wall push up 1 x 10    demonstation for proper form     Shoulder Exercises: Stretch   Other Shoulder Stretches upper trap stretch / levator scapuale stretch 1x 30 ea.      Manual Therapy   Manual therapy comments MTPR along R infraspinatus                   PT Education - 12/03/19 1614    Education Details evaluation findings, POC goals, HEp with proper form/ rationale,    Person(s) Educated Patient    Methods Explanation;Verbal cues;Handout    Comprehension Verbalized understanding;Verbal cues required            PT Short Term Goals - 12/03/19 1621      PT SHORT TERM GOAL #1   Title pt to be I with inital HEP    Time 3    Period Weeks    Status New    Target Date 12/24/19             PT Long Term Goals - 12/03/19 1621      PT LONG TERM GOAL #1   Title increase R UE strenght to >/= 4+/5 with no report of pain during testing    Time 6    Period Weeks    Status New    Target Date 01/14/20      PT LONG TERM GOAL #2   Title pt to report max pain to </= 2/10 to demo improvement in of condition and QOL    Time 6    Period Weeks    Status New    Target Date 01/14/20      PT LONG TERM GOAL #3   Title pt to be able to perform general lifting from floor to waist and  waist to overhead with no report of pain or limtiations for ADLS    Time 6    Period Weeks    Status New    Target Date 01/14/20      PT LONG TERM GOAL #4   Title pt to be I with all HEP to maintain and progress current level of function    Time 6    Period Weeks    Status New    Target Date 01/14/20                  Plan - 12/03/19 1617    Clinical Impression Statement  pt presents to OPPT with CC of R shoulder pain that started about a year ago with no preceding MOI. she has fucntional ROM with mild limitation noted with IR compared bil, and weakness with reprodcution of concordant symptoms with resisted flexion/ abduction and IR. mulitple trigger points along the upper trap/ levaator scapulae and infraspinatus. she demonstrates type I scapular dyskinesia and special testing suggest high liklihood of impingment. She would benefit from physical therapy to decrease shoulder pain, improve strength and maximize function by  addressing the deficits listed.    Stability/Clinical Decision Making Stable/Uncomplicated    Clinical Decision Making Low    Rehab Potential Good    PT Frequency 1x / week    PT Duration 6 weeks    PT Treatment/Interventions ADLs/Self Care Home Management;Cryotherapy;Electrical Stimulation;Iontophoresis 4mg /ml Dexamethasone;Moist Heat;Ultrasound;Therapeutic exercise;Therapeutic activities;Balance training;Neuromuscular re-education;Manual techniques;Passive range of motion;Dry needling;Taping;Patient/family education    PT Next Visit Plan review/ update HEP PRN, discussed DN for infraspinatus/ upper trap and levator, posterior shoulder strengthening,    PT Home Exercise Plan [email or web address] - upper trap/ levator stretch, shoulder IR/ER, rows, wall push up, lower trap wall ys,    Consulted and Agree with Plan of Care Patient           Patient will benefit from skilled therapeutic intervention in order to improve the following deficits and impairments:     Visit Diagnosis: Chronic right shoulder pain     Problem List Patient Active Problem List   Diagnosis Date Noted  . Hyperlipidemia 08/06/2017  . Osteopenia 08/06/2017  . Renal calculus or stone 10/18/2014  . 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  . 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  . Visit for preventive health examination 03/02/2013  . History  of renal stone 02/26/2012  . Microscopic hematuria 02/26/2012  . Preventative health care 02/04/2011  . DVT (deep vein thrombosis) in pregnancy   . Varicose veins   . VENOUS INSUFFICIENCY, CHRONIC 12/01/2007  . DEEP VENOUS THROMBOPHLEBITIS, LEFT, LEG, HX OF 12/01/2007    12/03/2007 PT, DPT, LAT, ATC  12/03/19  4:26 PM      [facility] [address] [place], [place], [place] Phone: [phone]   Fax:  [phone]  Name: JAKEYA GHERARDI MRN: Tiana Loft Date of Birth: 09/13/1950

## 2019-12-17 ENCOUNTER — Encounter: Payer: Medicare Other | Admitting: Physical Therapy

## 2019-12-31 ENCOUNTER — Encounter: Payer: Medicare Other | Admitting: Physical Therapy

## 2020-01-06 ENCOUNTER — Ambulatory Visit: Payer: Medicare Other | Admitting: Family Medicine

## 2020-02-10 LAB — HM MAMMOGRAPHY: HM Mammogram: ABNORMAL — AB (ref 0–4)

## 2020-02-10 LAB — HM DEXA SCAN

## 2020-11-25 ENCOUNTER — Emergency Department (HOSPITAL_COMMUNITY)
Admission: EM | Admit: 2020-11-25 | Discharge: 2020-11-25 | Disposition: A | Discharge status: Home / Self Care | Payer: Medicare Other | Attending: Emergency Medicine | Admitting: Emergency Medicine

## 2020-11-25 ENCOUNTER — Emergency Department (HOSPITAL_COMMUNITY): Payer: Medicare Other

## 2020-11-25 ENCOUNTER — Other Ambulatory Visit: Payer: Self-pay

## 2020-11-25 DIAGNOSIS — M25532 Pain in left wrist: Secondary | ICD-10-CM | POA: Diagnosis not present

## 2020-11-25 DIAGNOSIS — Y9301 Activity, walking, marching and hiking: Secondary | ICD-10-CM | POA: Diagnosis not present

## 2020-11-25 DIAGNOSIS — W19XXXA Unspecified fall, initial encounter: Secondary | ICD-10-CM | POA: Diagnosis not present

## 2020-11-25 DIAGNOSIS — M79602 Pain in left arm: Secondary | ICD-10-CM | POA: Diagnosis not present

## 2020-11-25 DIAGNOSIS — Z7982 Long term (current) use of aspirin: Secondary | ICD-10-CM | POA: Diagnosis not present

## 2020-11-25 DIAGNOSIS — S52612A Displaced fracture of left ulna styloid process, initial encounter for closed fracture: Secondary | ICD-10-CM | POA: Insufficient documentation

## 2020-11-25 DIAGNOSIS — S52502A Unspecified fracture of the lower end of left radius, initial encounter for closed fracture: Secondary | ICD-10-CM | POA: Insufficient documentation

## 2020-11-25 DIAGNOSIS — S6992XA Unspecified injury of left wrist, hand and finger(s), initial encounter: Secondary | ICD-10-CM | POA: Diagnosis present

## 2020-11-25 MED ORDER — IBUPROFEN 200 MG PO TABS
600.0000 mg | ORAL_TABLET | Freq: Once | ORAL | Status: AC
  Administered 2020-11-25: 600 mg via ORAL
  Filled 2020-11-25: qty 3

## 2020-11-25 MED ORDER — LIDOCAINE HCL 2 % IJ SOLN
10.0000 mL | Freq: Once | INTRAMUSCULAR | Status: AC
  Administered 2020-11-25: 200 mg via INTRADERMAL
  Filled 2020-11-25: qty 20

## 2020-11-25 NOTE — ED Provider Notes (Addendum)
[facility]-EMERGENCY DEPT Provider Note   CSN: [id number] Arrival date & time: 11/25/20  1218     History Chief Complaint  Patient presents with   Fall   Wrist Pain    Linda Richard is a 70 y.o. female.  HPI  70 year old female history of anxiety, back pain, diverticulitis, DVT, nephrolithiasis, shingles, joint pain, pneumonia, urinary frequency, urinary urgency, who presents to the emergency department today for evaluation after a fall.  Patient states that she was out hiking when she got her toe caught on a root and fell forward.  She caught herself with her hands and is now having pain and swelling to the left upper extremity.  She denies any head trauma or LOC.  She is not anticoagulated.  She denies any sensory changes to the left upper extremity and does not report any other significant injuries.  Past Medical History:  Diagnosis Date   Anxiety    takes Lexapro daily-just started 01/19/15   BACK PAIN 01/24/2009   Qualifier: Diagnosis of  By: Fabian Sharp MD, Neta Mends Uses inversion therapy and doing very well    Diverticulitis of intestine with perforation 10/06/2014   DVT (deep vein thrombosis) in pregnancy 1983   behind left leg   History of kidney stones 2011   History of shingles    Joint pain    NEPHROLITHIASIS 05/02/2010   Qualifier: Diagnosis of  By: Fabian Sharp MD, Neta Mends    Pneumonia as a child   Urinary frequency    Urinary urgency    just started taking Myrbetriq   Varicose veins    strip and laser    Patient Active Problem List   Diagnosis Date Noted   Hyperlipidemia 08/06/2017   Osteopenia 08/06/2017   Renal calculus or stone 10/18/2014   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   Visit for preventive health examination 03/02/2013   History of renal stone 02/26/2012   Microscopic hematuria 02/26/2012   Preventative health care 02/04/2011   DVT (deep vein  thrombosis) in pregnancy    Varicose veins    VENOUS INSUFFICIENCY, CHRONIC 12/01/2007   DEEP VENOUS THROMBOPHLEBITIS, LEFT, LEG, HX OF 12/01/2007    Past Surgical History:  Procedure Laterality Date   CESAREAN SECTION  1983   CHOLECYSTECTOMY N/A 01/30/2015   Procedure: LAPAROSCOPIC CHOLECYSTECTOMY;  Surgeon: Emelia Loron, MD;  Location: [facility] OR;  Service: General;  Laterality: N/A;   COLONOSCOPY     TONSILLECTOMY  as a child   veins stripped  80's   laser RX      OB History   No obstetric history on file.     Family History  Problem Relation Age of Onset   Hypertension Other    Kidney disease Other        renal stones   Arthritis Other    Cancer Father        bladder   25s    Ovarian cancer Mother        66s   Other Other        son age 40 irreg HB  had cardioversion    Thyroid disease Other    Colon polyps Neg Hx    Colon cancer Neg Hx    Rectal cancer Neg Hx    Stomach cancer Neg Hx     Social History   Tobacco Use   Smoking status: Never   Smokeless tobacco: Never  Vaping Use   Vaping Use: Never used  Substance Use Topics   Alcohol use: Yes    Alcohol/week: 0.0 standard drinks    Comment: 3 x a week, mostly wine no t daily]   Drug use: No    Home Medications Prior to Admission medications   Medication Sig Start Date End Date Taking? Authorizing Provider  Acetaminophen (TYLENOL ARTHRITIS PAIN PO) Take 800 mg by mouth daily as needed.    [provider]  aspirin 81 MG tablet Take 81 mg by mouth daily.      [provider]  busPIRone (BUSPAR) 7.5 MG tablet Take 7.5 mg twice daily 05/10/16   Olivia Mackieaavon, Richard, MD  calcium-vitamin D (OSCAL WITH D) 500-200 MG-UNIT per tablet Take 1 tablet by mouth daily.      [provider]  escitalopram (LEXAPRO) 10 MG tablet Take 10 mg orally daily 05/10/16   Olivia Mackieaavon, Richard, MD  estradiol (ESTRACE) 0.1 MG/GM vaginal cream Place 1 g vaginally 2 (two) times a week. Monday and Friday     [provider]  Multiple Vitamins-Minerals (ONE DAILY WOMENS 50+ PO) Take 1 tablet by mouth daily.    [provider]  Probiotic Product (PROBIOTIC DAILY PO) Take by mouth.    [provider]  tretinoin (RETIN-A) 0.025 % cream as needed. 07/05/17   [provider]    Allergies    Patient has no known allergies.  Review of Systems   Review of Systems  Constitutional:  Negative for fever.  Musculoskeletal:        Left wrist pain  Neurological:  Negative for weakness and numbness.       No head trauma or loc   Physical Exam Updated Vital Signs BP 135/70 (BP Location: Right Arm)   Pulse 71   Temp 98.8 F (37.1 C) (Oral)   Resp 16   Ht 5\' 8"  (1.727 m)   Wt 59 kg   SpO2 100%   BMI 19.77 kg/m   Physical Exam Vitals and nursing note reviewed.  Constitutional:      General: She is not in acute distress.    Appearance: She is well-developed.  HENT:     Head: Normocephalic and atraumatic.  Eyes:     Conjunctiva/sclera: Conjunctivae normal.  Cardiovascular:     Rate and Rhythm: Normal rate.  Pulmonary:     Effort: Pulmonary effort is normal.  Musculoskeletal:        General: Normal range of motion.     Cervical back: Neck supple.     Comments: TTP and swelling noted to the LUE. Radial/ulnar pulses intact. Brisk cap refill to all fingers of the LUE. Able to move all fingers.  Skin:    General: Skin is warm and dry.  Neurological:     Mental Status: She is alert.    ED Results / Procedures / Treatments   Labs (all labs ordered are listed, but only abnormal results are displayed) Labs Reviewed - No data to display  EKG None  Radiology DG Wrist Complete Left  Result Date: 11/25/2020 CLINICAL DATA:  Post reduction EXAM: LEFT WRIST - COMPLETE 3+ VIEW COMPARISON:  Same day wrist radiographs FINDINGS: Slight interval improvement in alignment of heavily comminuted, impacted intra-articular fractures of the distal left radius. The distal  left ulna is intact. The carpus is normally aligned. Cast material about the left wrist. IMPRESSION: Slight interval improvement in alignment of heavily comminuted, impacted intra-articular fractures of the distal left radius. Electronically  Signed   By: Lauralyn Primes M.D.   On: 11/25/2020 19:04   DG Wrist Complete Left  Result Date: 11/25/2020 CLINICAL DATA:  Status post casting of a left wrist fracture the patient suffered while hiking today. Initial encounter. EXAM: LEFT WRIST - COMPLETE 3+ VIEW COMPARISON:  Plain films left wrist earlier today. FINDINGS: Marked anterior displacement of a distal radius fracture and foreshortening persist. The patient is now and a plaster cast. Fracture through the base of the ulnar styloid seen on the prior exam is obscured. IMPRESSION: Status post casting. Marked volar displacement and fragment override of a comminuted intra-articular fracture the distal radius again seen. Electronically Signed   By: Drusilla Kanner M.D.   On: 11/25/2020 17:56   DG Wrist Complete Left  Result Date: 11/25/2020 CLINICAL DATA:  The patient suffered a left wrist and hand injury in a fall while hiking today. Initial encounter. EXAM: LEFT WRIST - COMPLETE 3+ VIEW COMPARISON:  None. FINDINGS: The patient has an acute, comminuted fracture of the distal radius with approximately 1/2 shaft width volar displacement and mild volar angulation. The fracture extends to the articular surface of the radius. Nondisplaced fracture through the base of the ulnar styloid is partially imaged. Soft tissues are swollen. IMPRESSION: Acute, comminuted, angulated and impacted intra-articular fracture of the distal left radius. Nondisplaced fracture base of the ulnar styloid. Electronically Signed   By: Drusilla Kanner M.D.   On: 11/25/2020 12:57   DG Hand Complete Left  Result Date: 11/25/2020 CLINICAL DATA:  The patient suffered a left wrist and hand injury in a fall while hiking today. Initial encounter. EXAM:  LEFT HAND - COMPLETE 3+ VIEW COMPARISON:  None. FINDINGS: Acute distal radius and ulnar styloid fractures are identified as seen on dedicated plain films of the wrist today. No other acute bony or joint abnormality. Scattered interphalangeal joint osteoarthritis appears worst at the DIP joints of the index and little fingers. IMPRESSION: Acute distal radius and ulnar styloid fractures as seen on plain films of the wrist today. No other acute abnormality. Electronically Signed   By: Drusilla Kanner M.D.   On: 11/25/2020 12:58    Procedures Reduction of fracture  Date/Time: 11/25/2020 4:52 PM Performed by: Karrie Meres, PA-C Authorized by: Karrie Meres, PA-C  Consent: Verbal consent obtained. Risks and benefits: risks, benefits and alternatives were discussed Consent given by: patient Patient understanding: patient states understanding of the procedure being performed Imaging studies: imaging studies available Patient identity confirmed: verbally with patient Time out: Immediately prior to procedure a "time out" was called to verify the correct patient, procedure, equipment, support staff and site/side marked as required. Preparation: Patient was prepped and draped in the usual sterile fashion. Local anesthesia used: yes Anesthesia: nerve block  Anesthesia: Local anesthesia used: yes Local Anesthetic: lidocaine 2% with epinephrine Anesthetic total: 10 mL  Sedation: Patient sedated: no  Patient tolerance: patient tolerated the procedure well with no immediate complications     Medications Ordered in ED Medications  lidocaine (XYLOCAINE) 2 % (with pres) injection 200 mg (200 mg Intradermal Given 11/25/20 1424)  ibuprofen (ADVIL) tablet 600 mg (600 mg Oral Given 11/25/20 1423)    ED Course  I have reviewed the triage vital signs and the nursing notes.  Pertinent labs & imaging results that were available during my care of the patient were reviewed by me and considered in my  medical decision making (see chart for details).    MDM Rules/Calculators/A&P  70 y/o F presenting for eval of a mechanical fall. She is c/o left arm pain and swelling.   Xray left wrist - cute, comminuted, angulated and impacted intra-articular fracture of the distal left radius. Nondisplaced fracture base of the ulnar styloid. Xray left hand - Acute distal radius and ulnar styloid fractures as seen on plain films of the wrist today. No other acute abnormality.   Hematoma block was completed and closed reduction was performed.   Post reduction films reviewed and are improved   6:32 PM CONSULT With Dr. Janee Morn with hand surgery who agrees with the plan to have patient in sugar-tong and follow-up in the office.  He states that the office will reach out to the patient on Monday to schedule an appointment for follow-up.   Patient made aware of plan for follow-up with Dr. Carollee Massed office.  She declines narcotic pain medications and will take ibuprofen at home.  Have advised on close follow-up and strict return precautions.  She voices understanding of plan and reasons to return.  Questions answered.  Patient stable for discharge.  Final Clinical Impression(s) / ED Diagnoses Final diagnoses:  Fall, initial encounter  Closed fracture of distal end of left radius, unspecified fracture morphology, initial encounter  Closed displaced fracture of styloid process of left ulna, initial encounter    Rx / DC Orders ED Discharge Orders     None        Karrie Meres, PA-C 11/25/20 1835    Rayne Du 11/25/20 1914    Gerhard Munch, MD 11/26/20 1931

## 2020-11-25 NOTE — Progress Notes (Addendum)
Orthopedic Tech Progress Note Patient Details:  Linda Richard 18-Sep-1950 [id number]  Pt was initially placed in sugar tong with one-step fiberglass, but Cortni Couture, PA-C and I agreed it was cumbersome to the Pt and insufficiently supporting the Fx as it was difficult to mold. Dr. Jeraldine Loots was consulted and supported the decision to switch the Pt into a plaster sugar tong splint as it allowed Cortni adequate time for molding and less irritation for the Pt. Once completed, Pt was remarkably more comfortable and showed function of all digits. I was called after a second set of x-rays were obtained and informed by Cortni, PA-C that the Fx failed to maintain reduction and would like to apply a third splint using one-step fiberglass again to attempt manipulation. Third splint was applied. Pt reported no areas of irritation and was placed back in sling.  Ortho Devices Type of Ortho Device: Shoulder immobilizer, Sugartong splint, Finger trap Finger Trap Weight: 10 Lbs Ortho Device/Splint Location: LUE Ortho Device/Splint Interventions: Ordered, Application, Adjustment   Post Interventions Patient Tolerated: Well Instructions Provided: Care of device, Adjustment of device  Linda Richard 11/25/2020, 5:06 PM

## 2020-11-25 NOTE — Discharge Instructions (Addendum)
You may rotate Tylenol and Motrin for your pain.  You were given a referral to Dr. Carollee Massed office.  His office will reach out to you on Monday, 11/27/2020 to schedule an appointment for follow-up.  If you do not hear from the office you can call the number listed on your discharge paperwork.  Please return to the emergency department for any new or worsening symptoms in the meantime.

## 2020-11-25 NOTE — ED Triage Notes (Signed)
Patient reports was hiking today, fell, injured left wrist. Significant swelling present. Pain 8/10

## 2020-11-27 ENCOUNTER — Other Ambulatory Visit: Payer: Self-pay

## 2020-11-27 ENCOUNTER — Other Ambulatory Visit: Payer: Self-pay | Admitting: Orthopedic Surgery

## 2020-11-27 ENCOUNTER — Encounter (HOSPITAL_BASED_OUTPATIENT_CLINIC_OR_DEPARTMENT_OTHER): Payer: Self-pay | Admitting: Orthopedic Surgery

## 2020-11-28 ENCOUNTER — Encounter: Payer: Medicare Other | Admitting: Internal Medicine

## 2020-12-03 NOTE — H&P (Signed)
Primary Care Provider: Dr. Berniece Andreas Referring Provider: ED Worker's Comp: No Date of Injury or Onset: 11-25-20  History: CC / Reason for Visit: Left wrist injury HPI: This patient is a 70 year old RHD retired female who presents for evaluation of a left wrist injury that occurred after she tripped and fell hiking.  She went to the emergency department where her distal radius fracture was provisionally reduced and splinted.  She presents for further evaluation.  She has been taking Tylenol and Motrin, but finds that the most of it sometimes bothers her stomach.  Past medical history, past surgical history, family history, social history, medications, allergies and review of systems are thoroughly reviewed by me, signed and scanned into SRS today.    Exam:  Vitals: Refer to EMR. Constitutional:  WD, WN, NAD HEENT:  NCAT, EOMI Neuro/Psych:  Alert & oriented to person, place, and time; appropriate mood & affect Lymphatic: No generalized UE edema or lymphadenopathy Extremities / MSK:  Both UE are normal with respect to appearance, ranges of motion, joint stability, muscle strength/tone, sensation, & perfusion except as otherwise noted:  The sugar tong splint is intact.  The exposed digits distally are bruised, but with decent motion, intact light touch sensibility the radial, median, and ulnar nerve distributions.  Intact motor to the same  Labs / X-rays:  No radiographic studies obtained today.  Injury x-rays are reviewed, revealing a comminuted fracture of the distal radius with multiple fragments, intra-articular extension, shortening and volar translational displacement  Assessment: Multiply comminuted and displaced left distal radius fracture  Plan:  I discussed these findings with her and options for treatment.  I reviewed the indications for operative intervention and what that might entail.  Plastic models were used.  I showed her an example of what a similar plate looks like.  We  reviewed the details of locked volar plate fixation.  We will plan to proceed on Monday the 18th and I reviewed the rationale for the delayed timing. The details of the operative procedure were discussed with the patient.  Questions were invited and answered.  In addition to the goal of the procedure, the risks of the procedure to include but not limited to bleeding; infection; damage to the nerves or blood vessels that could result in bleeding, numbness, weakness, chronic pain, and the need for additional procedures; stiffness; the need for revision surgery; and anesthetic risks were reviewed.  No specific outcome was guaranteed or implied.  Informed consent was obtained.  Additionally, we prescribed meloxicam today as a substitute for the Motrin and discussed the likelihood of prescribing oxycodone perioperatively.   Autoauthenticated,  Cliffton Asters. Janee Morn, MD

## 2020-12-03 NOTE — Anesthesia Preprocedure Evaluation (Addendum)
Anesthesia Evaluation  Patient identified by MRN, date of birth, ID band Patient awake    Reviewed: Allergy & Precautions, H&P , NPO status , Patient's Chart, lab work & pertinent test results  Airway Mallampati: II  TM Distance: >3 FB Neck ROM: Full    Dental no notable dental hx. (+) Teeth Intact, Dental Advisory Given, Caps   Pulmonary neg pulmonary ROS,    Pulmonary exam normal breath sounds clear to auscultation       Cardiovascular Exercise Tolerance: Good negative cardio ROS Normal cardiovascular exam Rhythm:Regular Rate:Normal     Neuro/Psych PSYCHIATRIC DISORDERS Anxiety negative neurological ROS  negative psych ROS   GI/Hepatic negative GI ROS, Neg liver ROS,   Endo/Other  negative endocrine ROS  Renal/GU Renal diseasenegative Renal ROS  negative genitourinary   Musculoskeletal negative musculoskeletal ROS (+)   Abdominal   Peds negative pediatric ROS (+)  Hematology negative hematology ROS (+)   Anesthesia Other Findings   Reproductive/Obstetrics negative OB ROS                            Anesthesia Physical Anesthesia Plan  ASA: 2  Anesthesia Plan: General   Post-op Pain Management: GA combined w/ Regional for post-op pain   Induction:   PONV Risk Score and Plan: 3 and Ondansetron, Dexamethasone and Midazolam  Airway Management Planned: Oral ETT and LMA  Additional Equipment: None  Intra-op Plan:   Post-operative Plan: Extubation in OR  Informed Consent: I have reviewed the patients History and Physical, chart, labs and discussed the procedure including the risks, benefits and alternatives for the proposed anesthesia with the patient or authorized representative who has indicated his/her understanding and acceptance.     Dental advisory given  Plan Discussed with: Anesthesiologist  Anesthesia Plan Comments: (Discussed both nerve block for pain relief  post-op and GA; including NV, sore throat, dental injury, and pulmonary complications)        Anesthesia Quick Evaluation

## 2020-12-04 ENCOUNTER — Encounter (HOSPITAL_BASED_OUTPATIENT_CLINIC_OR_DEPARTMENT_OTHER): Payer: Self-pay | Admitting: Orthopedic Surgery

## 2020-12-04 ENCOUNTER — Other Ambulatory Visit: Payer: Self-pay

## 2020-12-04 ENCOUNTER — Encounter (HOSPITAL_BASED_OUTPATIENT_CLINIC_OR_DEPARTMENT_OTHER)
Admission: RE | Disposition: A | Discharge status: Home / Self Care | Payer: Self-pay | Source: Home / Self Care | Attending: Orthopedic Surgery

## 2020-12-04 ENCOUNTER — Ambulatory Visit (HOSPITAL_BASED_OUTPATIENT_CLINIC_OR_DEPARTMENT_OTHER): Payer: Medicare Other | Admitting: Anesthesiology

## 2020-12-04 ENCOUNTER — Ambulatory Visit (HOSPITAL_BASED_OUTPATIENT_CLINIC_OR_DEPARTMENT_OTHER)
Admission: RE | Admit: 2020-12-04 | Discharge: 2020-12-04 | Disposition: A | Discharge status: Home / Self Care | Payer: Medicare Other | Attending: Orthopedic Surgery | Admitting: Orthopedic Surgery

## 2020-12-04 ENCOUNTER — Ambulatory Visit (HOSPITAL_COMMUNITY): Payer: Medicare Other

## 2020-12-04 DIAGNOSIS — W010XXA Fall on same level from slipping, tripping and stumbling without subsequent striking against object, initial encounter: Secondary | ICD-10-CM | POA: Insufficient documentation

## 2020-12-04 DIAGNOSIS — S52572A Other intraarticular fracture of lower end of left radius, initial encounter for closed fracture: Secondary | ICD-10-CM | POA: Insufficient documentation

## 2020-12-04 DIAGNOSIS — Y9301 Activity, walking, marching and hiking: Secondary | ICD-10-CM | POA: Insufficient documentation

## 2020-12-04 DIAGNOSIS — Z419 Encounter for procedure for purposes other than remedying health state, unspecified: Secondary | ICD-10-CM

## 2020-12-04 HISTORY — PX: OPEN REDUCTION INTERNAL FIXATION (ORIF) DISTAL RADIAL FRACTURE: SHX5989

## 2020-12-04 SURGERY — OPEN REDUCTION INTERNAL FIXATION (ORIF) DISTAL RADIUS FRACTURE
Anesthesia: General | Site: Arm Lower | Laterality: Left

## 2020-12-04 MED ORDER — ACETAMINOPHEN 160 MG/5ML PO SOLN: 325.0000 mg | ORAL | Status: DC | PRN

## 2020-12-04 MED ORDER — FENTANYL CITRATE (PF) 100 MCG/2ML IJ SOLN: 25.0000 ug | INTRAMUSCULAR | Status: DC | PRN

## 2020-12-04 MED ORDER — OXYCODONE HCL 5 MG PO TABS: 5.0000 mg | ORAL_TABLET | Freq: Four times a day (QID) | ORAL | 0 refills | Status: DC | PRN

## 2020-12-04 MED ORDER — 0.9 % SODIUM CHLORIDE (POUR BTL) OPTIME
TOPICAL | Status: DC | PRN
  Administered 2020-12-04: 100 mL

## 2020-12-04 MED ORDER — FENTANYL CITRATE (PF) 100 MCG/2ML IJ SOLN
100.0000 ug | Freq: Once | INTRAMUSCULAR | Status: AC
  Administered 2020-12-04: 100 ug via INTRAVENOUS

## 2020-12-04 MED ORDER — DEXMEDETOMIDINE (PRECEDEX) IN NS 20 MCG/5ML (4 MCG/ML) IV SYRINGE
PREFILLED_SYRINGE | INTRAVENOUS | Status: DC | PRN
  Administered 2020-12-04: 12 ug via INTRAVENOUS

## 2020-12-04 MED ORDER — ONDANSETRON HCL 4 MG/2ML IJ SOLN: 4.0000 mg | Freq: Once | INTRAMUSCULAR | Status: DC | PRN

## 2020-12-04 MED ORDER — ACETAMINOPHEN 325 MG PO TABS: 650.0000 mg | ORAL_TABLET | Freq: Four times a day (QID) | ORAL | Status: DC

## 2020-12-04 MED ORDER — LACTATED RINGERS IV SOLN: INTRAVENOUS | Status: DC

## 2020-12-04 MED ORDER — CEFAZOLIN SODIUM-DEXTROSE 2-4 GM/100ML-% IV SOLN
INTRAVENOUS | Status: AC
  Filled 2020-12-04: qty 100

## 2020-12-04 MED ORDER — PROPOFOL 500 MG/50ML IV EMUL
INTRAVENOUS | Status: DC | PRN
  Administered 2020-12-04: 100 ug/kg/min via INTRAVENOUS

## 2020-12-04 MED ORDER — BUPIVACAINE LIPOSOME 1.3 % IJ SUSP
INTRAMUSCULAR | Status: DC | PRN
  Administered 2020-12-04: 10 mL via PERINEURAL

## 2020-12-04 MED ORDER — BUPIVACAINE-EPINEPHRINE (PF) 0.5% -1:200000 IJ SOLN
INTRAMUSCULAR | Status: DC | PRN
  Administered 2020-12-04: 10 mL via PERINEURAL

## 2020-12-04 MED ORDER — MIDAZOLAM HCL 2 MG/2ML IJ SOLN
2.0000 mg | Freq: Once | INTRAMUSCULAR | Status: AC
  Administered 2020-12-04: 2 mg via INTRAVENOUS

## 2020-12-04 MED ORDER — IBUPROFEN 200 MG PO TABS: 600.0000 mg | ORAL_TABLET | Freq: Four times a day (QID) | ORAL | Status: DC

## 2020-12-04 MED ORDER — OXYCODONE HCL 5 MG PO TABS
5.0000 mg | ORAL_TABLET | Freq: Once | ORAL | Status: DC | PRN
Start: 2020-12-04 — End: 2020-12-04

## 2020-12-04 MED ORDER — ONDANSETRON HCL 4 MG/2ML IJ SOLN
INTRAMUSCULAR | Status: DC | PRN
  Administered 2020-12-04: 4 mg via INTRAVENOUS

## 2020-12-04 MED ORDER — OXYCODONE HCL 5 MG/5ML PO SOLN: 5.0000 mg | Freq: Once | ORAL | Status: DC | PRN

## 2020-12-04 MED ORDER — MEPERIDINE HCL 25 MG/ML IJ SOLN: 6.2500 mg | INTRAMUSCULAR | Status: DC | PRN

## 2020-12-04 MED ORDER — FENTANYL CITRATE (PF) 100 MCG/2ML IJ SOLN
INTRAMUSCULAR | Status: AC
  Filled 2020-12-04: qty 2

## 2020-12-04 MED ORDER — ACETAMINOPHEN 325 MG PO TABS: 325.0000 mg | ORAL_TABLET | ORAL | Status: DC | PRN

## 2020-12-04 MED ORDER — MIDAZOLAM HCL 2 MG/2ML IJ SOLN
INTRAMUSCULAR | Status: AC
  Filled 2020-12-04: qty 2

## 2020-12-04 MED ORDER — LIDOCAINE HCL (CARDIAC) PF 100 MG/5ML IV SOSY
PREFILLED_SYRINGE | INTRAVENOUS | Status: DC | PRN
  Administered 2020-12-04: 30 mg via INTRAVENOUS

## 2020-12-04 MED ORDER — CEFAZOLIN SODIUM-DEXTROSE 2-4 GM/100ML-% IV SOLN
2.0000 g | INTRAVENOUS | Status: AC
  Administered 2020-12-04: 2 g via INTRAVENOUS

## 2020-12-04 SURGICAL SUPPLY — 73 items
APL PRP STRL LF DISP 70% ISPRP (MISCELLANEOUS) ×1
BAND INSRT 18 STRL LF DISP RB (MISCELLANEOUS)
BAND RUBBER #18 3X1/16 STRL (MISCELLANEOUS) IMPLANT
BIT DRILL SOLID 2.0X40MM (BIT) ×1 IMPLANT
BIT DRILL SOLID 2.5X40MM (BIT) ×1 IMPLANT
BLADE HEX COATED 2.75 (ELECTRODE) ×2 IMPLANT
BLADE MINI RND TIP GREEN BEAV (BLADE) IMPLANT
BLADE SURG 15 STRL LF DISP TIS (BLADE) ×1 IMPLANT
BLADE SURG 15 STRL SS (BLADE) ×2
BNDG CMPR 9X4 STRL LF SNTH (GAUZE/BANDAGES/DRESSINGS) ×1
BNDG COHESIVE 2X5 TAN STRL LF (GAUZE/BANDAGES/DRESSINGS) IMPLANT
BNDG COHESIVE 4X5 TAN STRL (GAUZE/BANDAGES/DRESSINGS) ×2 IMPLANT
BNDG ESMARK 4X9 LF (GAUZE/BANDAGES/DRESSINGS) ×2 IMPLANT
BNDG GAUZE ELAST 4 BULKY (GAUZE/BANDAGES/DRESSINGS) ×2 IMPLANT
BRUSH SCRUB EZ PLAIN DRY (MISCELLANEOUS) ×2 IMPLANT
CANISTER SUCT 1200ML W/VALVE (MISCELLANEOUS) ×2 IMPLANT
CHLORAPREP W/TINT 26 (MISCELLANEOUS) ×2 IMPLANT
CORD BIPOLAR FORCEPS 12FT (ELECTRODE) ×2 IMPLANT
COVER BACK TABLE 60X90IN (DRAPES) ×2 IMPLANT
COVER MAYO STAND STRL (DRAPES) ×2 IMPLANT
CUFF TOURN SGL QUICK 18X4 (TOURNIQUET CUFF) ×2 IMPLANT
CUFF TOURN SGL QUICK 24 (TOURNIQUET CUFF)
CUFF TRNQT CYL 24X4X16.5-23 (TOURNIQUET CUFF) IMPLANT
DRAPE C-ARM 42X72 X-RAY (DRAPES) ×2 IMPLANT
DRAPE EXTREMITY T 121X128X90 (DISPOSABLE) ×2 IMPLANT
DRAPE SURG 17X23 STRL (DRAPES) ×2 IMPLANT
DRILL SOLID 2.0X40MM (BIT) ×2
DRILL SOLID 2.5X40MM (BIT) ×2
DRSG ADAPTIC 3X8 NADH LF (GAUZE/BANDAGES/DRESSINGS) ×2 IMPLANT
DRSG EMULSION OIL 3X3 NADH (GAUZE/BANDAGES/DRESSINGS) IMPLANT
ELECT REM PT RETURN 9FT ADLT (ELECTROSURGICAL) ×2
ELECTRODE REM PT RTRN 9FT ADLT (ELECTROSURGICAL) ×1 IMPLANT
GAUZE SPONGE 4X4 12PLY STRL LF (GAUZE/BANDAGES/DRESSINGS) ×2 IMPLANT
GLOVE SRG 8 PF TXTR STRL LF DI (GLOVE) ×1 IMPLANT
GLOVE SURG ENC MOIS LTX SZ7.5 (GLOVE) ×4 IMPLANT
GLOVE SURG LTX SZ6.5 (GLOVE) ×4 IMPLANT
GLOVE SURG UNDER POLY LF SZ7 (GLOVE) ×2 IMPLANT
GLOVE SURG UNDER POLY LF SZ8 (GLOVE) ×2
GOWN STRL REUS W/ TWL LRG LVL3 (GOWN DISPOSABLE) ×2 IMPLANT
GOWN STRL REUS W/TWL LRG LVL3 (GOWN DISPOSABLE) ×4
GOWN STRL REUS W/TWL XL LVL3 (GOWN DISPOSABLE) ×2 IMPLANT
NEEDLE HYPO 25X1 1.5 SAFETY (NEEDLE) IMPLANT
NS IRRIG 1000ML POUR BTL (IV SOLUTION) ×2 IMPLANT
PACK BASIN DAY SURGERY FS (CUSTOM PROCEDURE TRAY) ×2 IMPLANT
PADDING CAST ABS 4INX4YD NS (CAST SUPPLIES) ×1
PADDING CAST ABS COTTON 4X4 ST (CAST SUPPLIES) ×1 IMPLANT
PEG GEMINUS SMOOTH LOCK 2.0X19 (Peg) ×6 IMPLANT
PEG SMOOTH LOCKING 21MM (Peg) ×6 IMPLANT
PENCIL SMOKE EVACUATOR (MISCELLANEOUS) ×2 IMPLANT
PLATE GEMINUS DIST RAD LTN 4HL (Plate) ×2 IMPLANT
SCREW CORT LOCK 3.5X10 TI (Screw) ×4 IMPLANT
SCREW CORT LOCK 3.5X12 TI (Screw) ×2 IMPLANT
SCREW NONLOCK POLYAXIAL 3.5X11 (Screw) ×2 IMPLANT
SCREWDRIVER SURG ST 2 (INSTRUMENTS) ×4 IMPLANT
SLEEVE SCD COMPRESS KNEE MED (STOCKING) ×2 IMPLANT
SLING ARM FOAM STRAP LRG (SOFTGOODS) ×2 IMPLANT
SPLINT PLASTER CAST XFAST 3X15 (CAST SUPPLIES) IMPLANT
SPLINT PLASTER XTRA FASTSET 3X (CAST SUPPLIES)
STOCKINETTE 6  STRL (DRAPES) ×2
STOCKINETTE 6 STRL (DRAPES) ×1 IMPLANT
SUCTION FRAZIER HANDLE 10FR (MISCELLANEOUS) ×2
SUCTION TUBE FRAZIER 10FR DISP (MISCELLANEOUS) ×1 IMPLANT
SUT VIC AB 2-0 PS2 27 (SUTURE) ×2 IMPLANT
SUT VICRYL 4-0 PS2 18IN ABS (SUTURE) IMPLANT
SUT VICRYL RAPIDE 4-0 (SUTURE) IMPLANT
SUT VICRYL RAPIDE 4/0 PS 2 (SUTURE) ×2 IMPLANT
SYR 10ML LL (SYRINGE) IMPLANT
SYR BULB EAR ULCER 3OZ GRN STR (SYRINGE) ×2 IMPLANT
TOWEL GREEN STERILE FF (TOWEL DISPOSABLE) ×2 IMPLANT
TUBE CONNECTING 20X1/4 (TUBING) ×2 IMPLANT
UNDERPAD 30X36 HEAVY ABSORB (UNDERPADS AND DIAPERS) ×2 IMPLANT
WIRE FIX 1.5 STANDARD TIP (WIRE) ×2
WIRE FIX 1.5 STD TIP (WIRE) ×1 IMPLANT

## 2020-12-04 NOTE — Anesthesia Postprocedure Evaluation (Signed)
Anesthesia Post Note  Patient: Linda Richard  Procedure(s) Performed: OPEN TREATMENT OF DISPLACED LEFT DISTAL RADIUS FRACTURE (Left: Arm Lower)     Patient location during evaluation: PACU Anesthesia Type: General Level of consciousness: awake and alert Pain management: pain level controlled Vital Signs Assessment: post-procedure vital signs reviewed and stable Respiratory status: spontaneous breathing, nonlabored ventilation, respiratory function stable and patient connected to nasal cannula oxygen Cardiovascular status: blood pressure returned to baseline and stable Postop Assessment: no apparent nausea or vomiting Anesthetic complications: no   No notable events documented.  Last Vitals:  Vitals:   12/04/20 1030 12/04/20 1045  BP:  (!) 158/73  Pulse:    Resp:    Temp:    SpO2: 95%     Last Pain:  Vitals:   12/04/20 1030  TempSrc:   PainSc: 0-No pain                 Sentoria Brent

## 2020-12-04 NOTE — Anesthesia Procedure Notes (Signed)
Anesthesia Regional Block: Interscalene brachial plexus block   Pre-Anesthetic Checklist: , timeout performed,  Correct Patient, Correct Site, Correct Laterality,  Correct Procedure, Correct Position, site marked,  Risks and benefits discussed,  Surgical consent,  Pre-op evaluation,  At surgeon's request and post-op pain management  Laterality: Left  Prep: chloraprep       Needles:  Injection technique: Single-shot  Needle Type: Echogenic Stimulator Needle     Needle Length: 5cm  Needle Gauge: 22     Additional Needles:   Procedures:, nerve stimulator,,, ultrasound used (permanent image in chart),,     Nerve Stimulator or Paresthesia:  Response: hand, 0.45 mA  Additional Responses:   Narrative:  Start time: 12/04/2020 7:35 AM End time: 12/04/2020 7:40 AM Injection made incrementally with aspirations every 5 mL.  Performed by: Personally  Anesthesiologist: Bethena Midget, MD  Additional Notes: Functioning IV was confirmed and monitors were applied.  A 57mm 22ga Arrow echogenic stimulator needle was used. Sterile prep and drape,hand hygiene and sterile gloves were used. Ultrasound guidance: relevant anatomy identified, needle position confirmed, local anesthetic spread visualized around nerve(s)., vascular puncture avoided.  Image printed for medical record. Negative aspiration and negative test dose prior to incremental administration of local anesthetic. The patient tolerated the procedure well.   NO PIK OFFLINE

## 2020-12-04 NOTE — Anesthesia Procedure Notes (Signed)
Procedure Name: LMA Insertion Date/Time: 12/04/2020 9:08 AM Performed by: Sheryn Bison, CRNA Pre-anesthesia Checklist: Patient identified, Emergency Drugs available, Suction available and Patient being monitored Patient Re-evaluated:Patient Re-evaluated prior to induction Oxygen Delivery Method: Circle System Utilized Preoxygenation: Pre-oxygenation with 100% oxygen Induction Type: IV induction Ventilation: Mask ventilation without difficulty LMA: LMA inserted LMA Size: 3.0 Number of attempts: 1 Airway Equipment and Method: bite block Placement Confirmation: positive ETCO2 Tube secured with: Tape Dental Injury: Teeth and Oropharynx as per pre-operative assessment

## 2020-12-04 NOTE — Progress Notes (Signed)
Assisted Dr. Oddono with left, ultrasound guided, supraclavicular block. Side rails up, monitors on throughout procedure. See vital signs in flow sheet. Tolerated Procedure well. 

## 2020-12-04 NOTE — Op Note (Signed)
12/04/2020  8:42 AM  PATIENT:  Linda Richard  70 y.o. female  PRE-OPERATIVE DIAGNOSIS:  Heavily comminuted intra-articular distal radius fx  POST-OPERATIVE DIAGNOSIS:  Same  PROCEDURE:  ORIF L intra-articular distal radius fx, (3+ frag), 40981  SURGEON: Cliffton Asters. Janee Morn, MD  PHYSICIAN ASSISTANT: Danielle Rankin, OPA-C  ANESTHESIA:  regional and general  SPECIMENS:  None  DRAINS: None  EBL:  less than 50 mL  PREOPERATIVE INDICATIONS:  Linda Richard is a  70 y.o. female with a comminuted, displaced L DRFx that was provisionally reduced in the ED  The risks benefits and alternatives were discussed with the patient preoperatively including but not limited to the risks of infection, bleeding, nerve injury, cardiopulmonary complications, the need for revision surgery, among others, and the patient verbalized understanding and consented to proceed.  OPERATIVE IMPLANTS: [employer] Geminus plate/screws/pegs  OPERATIVE PROCEDURE: After receiving prophylactic antibiotics, the patient was escorted to the operative theatre and placed in a supine position.   A surgical "time-out" was performed during which the planned procedure, proposed operative site, and the correct patient identity were compared to the operative consent and agreement confirmed by the circulating nurse according to current facility policy. Following application of a tourniquet to the operative extremity, the exposed skin was pre-scrubbed with Hibiclens scrub brush and then was prepped with Chloraprep and draped in the usual sterile fashion. The limb was exsanguinated with an Esmarch bandage and the tourniquet inflated to approximately higher than systolic BP.   A sinusoidal-shaped incision was marked and made over the FCR axis and the distal forearm. As we were starting, the block was determined to be incomplete, so she was converted to general anesthesia.  The skin was incised sharply with scalpel,  subcutaneous tissues with blunt and spreading dissection. The FCR axis was exploited deeply. The pronator quadratus was reflected in an L-shaped ulnarly and the brachioradialis was split in a Z-plasty fashion for later reapproximation. The fracture was inspected and provisionally reduced.  This was confirmed fluoroscopically. The appropriately sized plate was selected and found to fit well. It was placed in its provisional alignment of the radius and this was confirmed fluoroscopically.  It was secured to the radius with a screw through the slotted hole.  Additional adjustments were made as necessary, and the distal holes were all drilled and filled.  Peg/screw length distally was selected on the shorter side of measurements to minimize the risk for dorsal cortical penetration. The remainder of the proximal holes were drilled and filled.   Final images were obtained and the DRUJ was examined for stability. It was found to be sufficiently stable. The wound was then copiously irrigated and the brachioradialis repaired with 2-0 Vicryl Rapide suture followed by repair of the pronator quadratus with the same suture type. Tourniquet was released and additional hemostasis obtained and the skin was closed with 2-0 Vicryl deep dermal buried sutures followed by running 4-0 Vicryl Rapide horizontal mattress suture in the skin. A bulky dressing with a volar plaster component was applied and the patient was taken to the recovery room in stable condition.  DISPOSITION: The patient will be discharged home today with typical post-op instructions, returning Aug 1 for reevaluation with new x-rays of the affected wrist out of the splint to include an inclined lateral and then transition to therapy to have a custom splint constructed and begin rehabilitation.

## 2020-12-04 NOTE — Discharge Instructions (Addendum)
Discharge Instructions   You have a dressing with a plaster splint incorporated in it. Move your fingers as much as possible, making a full fist and fully opening the fist. Elevate your hand to reduce pain & swelling of the digits.  Ice over the operative site may be helpful to reduce pain & swelling.  DO NOT USE HEAT. Pain medicine has been prescribed for you.  Take Tylenol 650 mg and ibuprofen 600 mg every 6 hours together for pain management. Take Oxycodone 5 mg additionally for severe post operative pain. Leave the dressing in place until you return to the office. You may shower, but keep the bandage clean & dry.  You may drive a car when you are off of prescription pain medications and can safely control your vehicle with both hands. Our office will call you to arrange follow-up   Please call [phone] during normal business hours or [phone] after hours for any problems. Including the following:  - excessive redness of the incisions - drainage for more than 4 days - fever of more than 101.5 F  *Please note that pain medications will not be refilled after hours or on weekends.    Post Anesthesia Home Care Instructions  Activity: Get plenty of rest for the remainder of the day. A responsible individual must stay with you for 24 hours following the procedure.  For the next 24 hours, DO NOT: -Drive a car -Advertising copywriter -Drink alcoholic beverages -Take any medication unless instructed by your physician -Make any legal decisions or sign important papers.  Meals: Start with liquid foods such as gelatin or soup. Progress to regular foods as tolerated. Avoid greasy, spicy, heavy foods. If nausea and/or vomiting occur, drink only clear liquids until the nausea and/or vomiting subsides. Call your physician if vomiting continues.  Special Instructions/Symptoms: Your throat may feel dry or sore from the anesthesia or the breathing tube placed in your throat during surgery.  If this causes discomfort, gargle with warm salt water. The discomfort should disappear within 24 hours.  If you had a scopolamine patch placed behind your ear for the management of post- operative nausea and/or vomiting:  1. The medication in the patch is effective for 72 hours, after which it should be removed.  Wrap patch in a tissue and discard in the trash. Wash hands thoroughly with soap and water. 2. You may remove the patch earlier than 72 hours if you experience unpleasant side effects which may include dry mouth, dizziness or visual disturbances. 3. Avoid touching the patch. Wash your hands with soap and water after contact with the patch.    Information for Discharge Teaching: EXPAREL (bupivacaine liposome injectable suspension)   Your surgeon or anesthesiologist gave you EXPAREL(bupivacaine) to help control your pain after surgery.  EXPAREL is a local anesthetic that provides pain relief by numbing the tissue around the surgical site. EXPAREL is designed to release pain medication over time and can control pain for up to 72 hours. Depending on how you respond to EXPAREL, you may require less pain medication during your recovery.  Possible side effects: Temporary loss of sensation or ability to move in the area where bupivacaine was injected. Nausea, vomiting, constipation Rarely, numbness and tingling in your mouth or lips, lightheadedness, or anxiety may occur. Call your doctor right away if you think you may be experiencing any of these sensations, or if you have other questions regarding possible side effects.  Follow all other discharge instructions given to you by your  surgeon or nurse. Eat a healthy diet and drink plenty of water or other fluids.  If you return to the hospital for any reason within 96 hours following the administration of EXPAREL, it is important for health care providers to know that you have received this anesthetic. A teal colored band has been placed on  your arm with the date, time and amount of EXPAREL you have received in order to alert and inform your health care providers. Please leave this armband in place for the full 96 hours following administration, and then you may remove the band.  Regional Anesthesia Blocks  1. Numbness or the inability to move the "blocked" extremity may last from 3-48 hours after placement. The length of time depends on the medication injected and your individual response to the medication. If the numbness is not going away after 48 hours, call your surgeon.  2. The extremity that is blocked will need to be protected until the numbness is gone and the  Strength has returned. Because you cannot feel it, you will need to take extra care to avoid injury. Because it may be weak, you may have difficulty moving it or using it. You may not know what position it is in without looking at it while the block is in effect.  3. For blocks in the legs and feet, returning to weight bearing and walking needs to be done carefully. You will need to wait until the numbness is entirely gone and the strength has returned. You should be able to move your leg and foot normally before you try and bear weight or walk. You will need someone to be with you when you first try to ensure you do not fall and possibly risk injury.  4. Bruising and tenderness at the needle site are common side effects and will resolve in a few days.  5. Persistent numbness or new problems with movement should be communicated to the surgeon or the [facility] [phone] [facility] [phone]).

## 2020-12-04 NOTE — Anesthesia Procedure Notes (Signed)
Procedure Name: MAC Date/Time: 12/04/2020 9:02 AM Performed by: Signe Colt, CRNA Pre-anesthesia Checklist: Patient identified, Emergency Drugs available, Suction available, Patient being monitored and Timeout performed Patient Re-evaluated:Patient Re-evaluated prior to induction Oxygen Delivery Method: Simple face mask

## 2020-12-04 NOTE — Transfer of Care (Signed)
Immediate Anesthesia Transfer of Care Note  Patient: Linda Richard  Procedure(s) Performed: OPEN TREATMENT OF DISPLACED LEFT DISTAL RADIUS FRACTURE (Left: Arm Lower)  Patient Location: PACU  Anesthesia Type:GA combined with regional for post-op pain  Level of Consciousness: drowsy and patient cooperative  Airway & Oxygen Therapy: Patient Spontanous Breathing and Patient connected to face mask oxygen  Post-op Assessment: Report given to RN and Post -op Vital signs reviewed and stable  Post vital signs: Reviewed and stable  Last Vitals:  Vitals Value Taken Time  BP    Temp    Pulse 75 12/04/20 1003  Resp 9 12/04/20 1003  SpO2 96 % 12/04/20 1003  Vitals shown include unvalidated device data.  Last Pain:  Vitals:   12/04/20 0703  TempSrc: Oral  PainSc: 5       Patients Stated Pain Goal: 8 (12/04/20 0703)  Complications: No notable events documented.

## 2020-12-04 NOTE — Interval H&P Note (Signed)
History and Physical Interval Note:  12/04/2020 8:41 AM  Linda Richard  has presented today for surgery, with the diagnosis of DISPLACED LEFT DISTAL RADIUS FRACTURE.  The various methods of treatment have been discussed with the patient and family. After consideration of risks, benefits and other options for treatment, the patient has consented to  Procedure(s): OPEN TREATMENT OF DISPLACED LEFT DISTAL RADIUS FRACTURE (Left) as a surgical intervention.  The patient's history has been reviewed, patient examined, no change in status, stable for surgery.  I have reviewed the patient's chart and labs.  Questions were answered to the patient's satisfaction.     [name]

## 2020-12-05 ENCOUNTER — Encounter (HOSPITAL_BASED_OUTPATIENT_CLINIC_OR_DEPARTMENT_OTHER): Payer: Self-pay | Admitting: Orthopedic Surgery

## 2020-12-12 ENCOUNTER — Other Ambulatory Visit: Payer: Self-pay

## 2020-12-12 NOTE — Progress Notes (Signed)
Chief Complaint  Patient presents with   Annual Exam     HPI: Linda Richard 70 y.o. comes in today for Preventive Medicare exam/ wellness visit .Since last visit.  Had done quite well until she had a trip hiking and fell and had a complicated fracture of her left wrist seeing [facility] see ED visits. Still remains physically active. Sees GYN on a regular basis Having nocturia   since  injury no dysuria  BP she thinks has been ok  Vit d and calcium .  Health Maintenance  Topic Date Due   Hepatitis C Screening  Never done   Zoster Vaccines- Shingrix (2 of 2) 08/05/2019   PNA vac Low Risk Adult (2 of 2 - PCV13) 11/13/2019   COVID-19 Vaccine (4 - Booster for Pfizer series) 08/05/2020   INFLUENZA VACCINE  12/18/2020   MAMMOGRAM  02/09/2022   COLONOSCOPY (Pts 45-31yrs Insurance coverage will need to be confirmed)  09/29/2024   TETANUS/TDAP  04/03/2025   DEXA SCAN  Completed   HPV VACCINES  Aged Out   Health Maintenance Review LIFESTYLE:  Exercise:   walking  Tobacco/ETS: non Alcohol:  none lately wine ocass Sugar beverages: no Sleep:good before injury .  Drug use: no HH: 2  Hearing: ok Vision:  No limitations at present . Last eye check UTD  glasses  fine  Safety:  Has smoke detector and wears seat belts.   No excess sun exposure. Sees dentist regularly. Falls:  Memory: Felt to be good  , no concern from her or her family. Depression: No anhedonia unusual crying or depressive symptoms Nutrition: Eats well balanced diet; adequate calcium and vitamin D. No swallowing chewing problems. Injury: no major injuries in the last six months. Other healthcare providers:  Reviewed today . Preventive parameters: up-to-date  Reviewed  ADLS:   There are no problems or need for assistance  driving, feeding, obtaining food, dressing, toileting and bathing, managing money using phone. She is independent.    ROS:  see above  REST of 12 system review negative except as per  HPI   Past Medical History:  Diagnosis Date   Anxiety    takes Lexapro daily-just started 01/19/15   BACK PAIN 01/24/2009   Qualifier: Diagnosis of  By: Fabian Sharp MD, Neta Mends Uses inversion therapy and doing very well    Diverticulitis of intestine with perforation 10/06/2014   DVT (deep vein thrombosis) in pregnancy 1983   behind left leg   History of kidney stones 2011   History of shingles    Joint pain    NEPHROLITHIASIS 05/02/2010   Qualifier: Diagnosis of  By: Fabian Sharp MD, Neta Mends    Pneumonia as a child   Urinary frequency    Urinary urgency    just started taking Myrbetriq   Varicose veins    strip and laser    Family History  Problem Relation Age of Onset   Hypertension Other    Kidney disease Other        renal stones   Arthritis Other    Cancer Father        bladder   10s    Ovarian cancer Mother        72s   Other Other        son age 77 irreg HB  had cardioversion    Thyroid disease Other    Colon polyps Neg Hx    Colon cancer Neg Hx    Rectal cancer Neg  Hx    Stomach cancer Neg Hx     Social History   Socioeconomic History   Marital status: Married    Spouse name: Not on file   Number of children: Not on file   Years of education: Not on file   Highest education level: Not on file  Occupational History   Not on file  Tobacco Use   Smoking status: Never   Smokeless tobacco: Never  Vaping Use   Vaping Use: Never used  Substance and Sexual Activity   Alcohol use: Yes    Alcohol/week: 0.0 standard drinks    Comment: 3 x a week, mostly wine no t daily]   Drug use: No   Sexual activity: Yes    Birth control/protection: Post-menopausal  Other Topics Concern   Not on file  Social History Narrative   HH of 2    Grandchild  Sons    reitred [employer] Child Support   Married.    No  ets tobacco No  Exercise restriction   etoh 2 x per week.    Exercise good.       G3P2   Social Determinants of Health   Financial Resource Strain: Not on file  Food  Insecurity: Not on file  Transportation Needs: Not on file  Physical Activity: Not on file  Stress: Not on file  Social Connections: Not on file    Outpatient Encounter Medications as of 12/13/2020  Medication Sig   acetaminophen (TYLENOL) 325 MG tablet Take 2 tablets (650 mg total) by mouth every 6 (six) hours.   aspirin 81 MG tablet Take 81 mg by mouth daily.     busPIRone (BUSPAR) 7.5 MG tablet Take 7.5 mg twice daily   calcium-vitamin D (OSCAL WITH D) 500-200 MG-UNIT per tablet Take 1 tablet by mouth daily.     escitalopram (LEXAPRO) 10 MG tablet Take 10 mg orally daily   estradiol (ESTRACE) 0.1 MG/GM vaginal cream Place 1 g vaginally 2 (two) times a week. Monday and Friday   ibuprofen (ADVIL) 200 MG tablet Take 3 tablets (600 mg total) by mouth every 6 (six) hours.   Multiple Vitamins-Minerals (ONE DAILY WOMENS 50+ PO) Take 1 tablet by mouth daily.   oxyCODONE (ROXICODONE) 5 MG immediate release tablet Take 1 tablet (5 mg total) by mouth every 6 (six) hours as needed for severe pain.   Probiotic Product (PROBIOTIC DAILY PO) Take by mouth.   tretinoin (RETIN-A) 0.025 % cream as needed.   No facility-administered encounter medications on file as of 12/13/2020.    EXAM:  BP (!) 146/80 (BP Location: Left Arm, Patient Position: Sitting, Cuff Size: Normal)   Pulse 75   Temp 98.3 F (36.8 C) (Oral)   Ht 5' 8.5" (1.74 m)   Wt 126 lb (57.2 kg)   SpO2 97%   BMI 18.88 kg/m   Body mass index is 18.88 kg/m.  Physical Exam: Vital signs reviewed ZOX:WRUE is a well-developed well-nourished alert cooperative   who appears stated age in no acute distress.  HEENT: normocephalic atraumatic , Eyes: PERRL EOM's full, conjunctiva clear, Nares: paten,t no deformity discharge or tenderness., Ears: no deformity EAC's clear TMs with normal landmarks. Mouth: masked  NECK: supple without masses, thyromegaly or bruits. CHEST/PULM:  Clear to auscultation and percussion breath sounds equal no wheeze ,  rales or rhonchi. No chest wall deformities or tenderness.Breast no nodule or dc  CV: PMI is nondisplaced, S1 S2 no gallops, murmurs, rubs. Peripheral pulses are full  without delay.No JVD .  ABDOMEN: Bowel sounds normal nontender  No guard or rebound, no hepato splenomegal no CVA tenderness.   Extremtities:  No clubbing cyanosis or edema, left forearm in cast slpint.  Bruising on arm and right knee  fading  NEURO:  Oriented x3, cranial nerves 3-12 appear to be intact, no obvious focal weakness,gait within normal limits no abnormal reflexes or asymmetrical SKIN: No acute rashes normal turgor, PSYCH: Oriented, good eye contact, no obvious depression anxiety, cognition and judgment appear normal. LN: no cervical axillary inguinal adenopathy No noted deficits in memory, attention, and speech.   Lab Results  Component Value Date   WBC 7.3 12/13/2020   HGB 15.0 12/13/2020   HCT 44.1 12/13/2020   PLT 301.0 12/13/2020   GLUCOSE 98 12/13/2020   CHOL 246 (H) 12/13/2020   TRIG 83.0 12/13/2020   HDL 88.70 12/13/2020   LDLDIRECT 128.8 02/23/2013   LDLCALC 141 (H) 12/13/2020   ALT 26 12/13/2020   AST 24 12/13/2020   NA 139 12/13/2020   K 4.1 12/13/2020   CL 99 12/13/2020   CREATININE 0.47 12/13/2020   BUN 16 12/13/2020   CO2 31 12/13/2020   TSH 0.87 12/13/2020   HGBA1C 5.3 11/13/2018   MICROALBUR <0.7 11/16/2019    ASSESSMENT AND PLAN:  Discussed the following assessment and plan:  Visit for preventive health examination  Hyperlipidemia, unspecified hyperlipidemia type - Plan: Basic metabolic panel, CBC with Differential/Platelet, Hepatic function panel, Lipid panel, TSH, T4, free, VITAMIN D 25 Hydroxy (Vit-D Deficiency, Fractures), Basic metabolic panel, CBC with Differential/Platelet, Hepatic function panel, Lipid panel, TSH, T4, free, VITAMIN D 25 Hydroxy (Vit-D Deficiency, Fractures)  Medication management - Plan: Basic metabolic panel, CBC with Differential/Platelet, Hepatic  function panel, Lipid panel, TSH, T4, free, VITAMIN D 25 Hydroxy (Vit-D Deficiency, Fractures), PTH, intact and calcium, Basic metabolic panel, CBC with Differential/Platelet, Hepatic function panel, Lipid panel, TSH, T4, free, VITAMIN D 25 Hydroxy (Vit-D Deficiency, Fractures), PTH, intact and calcium  Osteopenia, unspecified location - Plan: Basic metabolic panel, CBC with Differential/Platelet, Hepatic function panel, Lipid panel, TSH, T4, free, VITAMIN D 25 Hydroxy (Vit-D Deficiency, Fractures), PTH, intact and calcium, Basic metabolic panel, CBC with Differential/Platelet, Hepatic function panel, Lipid panel, TSH, T4, free, VITAMIN D 25 Hydroxy (Vit-D Deficiency, Fractures), PTH, intact and calcium  Wrist fracture, closed, left, sequela - Plan: Basic metabolic panel, CBC with Differential/Platelet, Hepatic function panel, Lipid panel, TSH, T4, free, VITAMIN D 25 Hydroxy (Vit-D Deficiency, Fractures), PTH, intact and calcium, Basic metabolic panel, CBC with Differential/Platelet, Hepatic function panel, Lipid panel, TSH, T4, free, VITAMIN D 25 Hydroxy (Vit-D Deficiency, Fractures), PTH, intact and calcium  Elevated blood pressure reading - Plan: Basic metabolic panel, CBC with Differential/Platelet, Hepatic function panel, Lipid panel, TSH, T4, free, VITAMIN D 25 Hydroxy (Vit-D Deficiency, Fractures), PTH, intact and calcium, Basic metabolic panel, CBC with Differential/Platelet, Hepatic function panel, Lipid panel, TSH, T4, free, VITAMIN D 25 Hydroxy (Vit-D Deficiency, Fractures), PTH, intact and calcium  Nocturia - Plan: Basic metabolic panel, CBC with Differential/Platelet, Hepatic function panel, Lipid panel, TSH, T4, free, VITAMIN D 25 Hydroxy (Vit-D Deficiency, Fractures), PTH, intact and calcium, POCT Urinalysis Dipstick (Automated), Basic metabolic panel, CBC with Differential/Platelet, Hepatic function panel, Lipid panel, TSH, T4, free, VITAMIN D 25 Hydroxy (Vit-D Deficiency, Fractures), PTH,  intact and calcium, Urine Culture, Urine Culture, CANCELED: Culture, Urine, CANCELED: Culture, Urine  Personal history of kidney stones - Plan: PTH, intact and calcium, PTH, intact and calcium  Patient Care  Team: Madelin Headings, MD as PCP - General Olivia Mackie, MD (Obstetrics and Gynecology) Loletha Carrow, MD as Consulting Physician (Ophthalmology) Elesa Hacker, MD as Referring Physician (Dermatology) Lynden Ang, NP as Nurse Practitioner (Obstetrics and Gynecology)  Patient Instructions  Good to see you today.  Will notify you  of labs when available.   Consideration of bone building therapy   depending .  Can get  prevnar  13 in future .   Check bpo readings  for 2-3 days at home and send in readings .   Goal is 130/80 or below    Health Maintenance, Female Adopting a healthy lifestyle and getting preventive care are important in promoting health and wellness. Ask your health care provider about: The right schedule for you to have regular tests and exams. Things you can do on your own to prevent diseases and keep yourself healthy. What should I know about diet, weight, and exercise? Eat a healthy diet  Eat a diet that includes plenty of vegetables, fruits, low-fat dairy products, and lean protein. Do not eat a lot of foods that are high in solid fats, added sugars, or sodium.  Maintain a healthy weight Body mass index (BMI) is used to identify weight problems. It estimates body fat based on height and weight. Your health care provider can help determineyour BMI and help you achieve or maintain a healthy weight. Get regular exercise Get regular exercise. This is one of the most important things you can do for your health. Most adults should: Exercise for at least 150 minutes each week. The exercise should increase your heart rate and make you sweat (moderate-intensity exercise). Do strengthening exercises at least twice a week. This is in addition to  the moderate-intensity exercise. Spend less time sitting. Even light physical activity can be beneficial. Watch cholesterol and blood lipids Have your blood tested for lipids and cholesterol at 68 years of age, then havethis test every 5 years. Have your cholesterol levels checked more often if: Your lipid or cholesterol levels are high. You are older than 68 years of age. You are at high risk for heart disease. What should I know about cancer screening? Depending on your health history and family history, you may need to have cancer screening at various ages. This may include screening for: Breast cancer. Cervical cancer. Colorectal cancer. Skin cancer. Lung cancer. What should I know about heart disease, diabetes, and high blood pressure? Blood pressure and heart disease High blood pressure causes heart disease and increases the risk of stroke. This is more likely to develop in people who have high blood pressure readings, are of African descent, or are overweight. Have your blood pressure checked: Every 3-5 years if you are 48-44 years of age. Every year if you are 2 years old or older. Diabetes Have regular diabetes screenings. This checks your fasting blood sugar level. Have the screening done: Once every three years after age 40 if you are at a normal weight and have a low risk for diabetes. More often and at a younger age if you are overweight or have a high risk for diabetes. What should I know about preventing infection? Hepatitis B If you have a higher risk for hepatitis B, you should be screened for this virus. Talk with your health care provider to find out if you are at risk forhepatitis B infection. Hepatitis C Testing is recommended for: Everyone born from [birth year] through 1965. Anyone with known risk factors for hepatitis C. Sexually  transmitted infections (STIs) Get screened for STIs, including gonorrhea and chlamydia, if: You are sexually active and are younger than  68 years of age. You are older than 68 years of age and your health care provider tells you that you are at risk for this type of infection. Your sexual activity has changed since you were last screened, and you are at increased risk for chlamydia or gonorrhea. Ask your health care provider if you are at risk. Ask your health care provider about whether you are at high risk for HIV. Your health care provider may recommend a prescription medicine to help prevent HIV infection. If you choose to take medicine to prevent HIV, you should first get tested for HIV. You should then be tested every 3 months for as long as you are taking the medicine. Pregnancy If you are about to stop having your period (premenopausal) and you may become pregnant, seek counseling before you get pregnant. Take 400 to 800 micrograms (mcg) of folic acid every day if you become pregnant. Ask for birth control (contraception) if you want to prevent pregnancy. Osteoporosis and menopause Osteoporosis is a disease in which the bones lose minerals and strength with aging. This can result in bone fractures. If you are 68 years old or older, or if you are at risk for osteoporosis and fractures, ask your health care provider if you should: Be screened for bone loss. Take a calcium or vitamin D supplement to lower your risk of fractures. Be given hormone replacement therapy (HRT) to treat symptoms of menopause. Follow these instructions at home: Lifestyle Do not use any products that contain nicotine or tobacco, such as cigarettes, e-cigarettes, and chewing tobacco. If you need help quitting, ask your health care provider. Do not use street drugs. Do not share needles. Ask your health care provider for help if you need support or information about quitting drugs. Alcohol use Do not drink alcohol if: Your health care provider tells you not to drink. You are pregnant, may be pregnant, or are planning to become pregnant. If you drink  alcohol: Limit how much you use to 0-1 drink a day. Limit intake if you are breastfeeding. Be aware of how much alcohol is in your drink. In the [place] one drink equals one 12 oz bottle of beer (355 mL), one 5 oz glass of wine (148 mL), or one 1 oz glass of hard liquor (44 mL). General instructions Schedule regular health, dental, and eye exams. Stay current with your vaccines. Tell your health care provider if: You often feel depressed. You have ever been abused or do not feel safe at home. Summary Adopting a healthy lifestyle and getting preventive care are important in promoting health and wellness. Follow your health care provider's instructions about healthy diet, exercising, and getting tested or screened for diseases. Follow your health care provider's instructions on monitoring your cholesterol and blood pressure. This information is not intended to replace advice given to you by your health care provider. Make sure you discuss any questions you have with your healthcare provider. Document Revised: 04/29/2018 Document Reviewed: 04/29/2018 Elsevier Patient Education  2022 [employer]    WinfieldWanda K. Nhia Heaphy M.D.

## 2020-12-13 ENCOUNTER — Ambulatory Visit (INDEPENDENT_AMBULATORY_CARE_PROVIDER_SITE_OTHER): Payer: Medicare Other | Admitting: Internal Medicine

## 2020-12-13 ENCOUNTER — Encounter: Payer: Self-pay | Admitting: Internal Medicine

## 2020-12-13 VITALS — BP 146/80 | HR 75 | Temp 98.3°F | Ht 68.5 in | Wt 126.0 lb

## 2020-12-13 DIAGNOSIS — R030 Elevated blood-pressure reading, without diagnosis of hypertension: Secondary | ICD-10-CM

## 2020-12-13 DIAGNOSIS — R351 Nocturia: Secondary | ICD-10-CM

## 2020-12-13 DIAGNOSIS — Z79899 Other long term (current) drug therapy: Secondary | ICD-10-CM | POA: Diagnosis not present

## 2020-12-13 DIAGNOSIS — Z0000 Encounter for general adult medical examination without abnormal findings: Secondary | ICD-10-CM

## 2020-12-13 DIAGNOSIS — Z87442 Personal history of urinary calculi: Secondary | ICD-10-CM

## 2020-12-13 DIAGNOSIS — M8580 Other specified disorders of bone density and structure, unspecified site: Secondary | ICD-10-CM | POA: Diagnosis not present

## 2020-12-13 DIAGNOSIS — S62102S Fracture of unspecified carpal bone, left wrist, sequela: Secondary | ICD-10-CM | POA: Diagnosis not present

## 2020-12-13 DIAGNOSIS — E785 Hyperlipidemia, unspecified: Secondary | ICD-10-CM

## 2020-12-13 LAB — CBC WITH DIFFERENTIAL/PLATELET
Basophils Absolute: 0.1 10*3/uL (ref 0.0–0.1)
Basophils Relative: 0.7 % (ref 0.0–3.0)
Eosinophils Absolute: 0.3 10*3/uL (ref 0.0–0.7)
Eosinophils Relative: 4.3 % (ref 0.0–5.0)
HCT: 44.1 % (ref 36.0–46.0)
Hemoglobin: 15 g/dL (ref 12.0–15.0)
Lymphocytes Relative: 20 % (ref 12.0–46.0)
Lymphs Abs: 1.5 10*3/uL (ref 0.7–4.0)
MCHC: 34.1 g/dL (ref 30.0–36.0)
MCV: 91.4 fl (ref 78.0–100.0)
Monocytes Absolute: 0.5 10*3/uL (ref 0.1–1.0)
Monocytes Relative: 7.5 % (ref 3.0–12.0)
Neutro Abs: 4.9 10*3/uL (ref 1.4–7.7)
Neutrophils Relative %: 67.5 % (ref 43.0–77.0)
Platelets: 301 10*3/uL (ref 150.0–400.0)
RBC: 4.82 Mil/uL (ref 3.87–5.11)
RDW: 13.2 % (ref 11.5–15.5)
WBC: 7.3 10*3/uL (ref 4.0–10.5)

## 2020-12-13 LAB — TSH: TSH: 0.87 u[IU]/mL (ref 0.35–5.50)

## 2020-12-13 LAB — POC URINALSYSI DIPSTICK (AUTOMATED)
Bilirubin, UA: NEGATIVE
Blood, UA: POSITIVE
Glucose, UA: NEGATIVE
Ketones, UA: NEGATIVE
Leukocytes, UA: NEGATIVE
Nitrite, UA: NEGATIVE
Protein, UA: POSITIVE — AB
Spec Grav, UA: 1.02 (ref 1.010–1.025)
Urobilinogen, UA: 0.2 E.U./dL
pH, UA: 5.5 (ref 5.0–8.0)

## 2020-12-13 LAB — BASIC METABOLIC PANEL
BUN: 16 mg/dL (ref 6–23)
CO2: 31 mEq/L (ref 19–32)
Calcium: 10 mg/dL (ref 8.4–10.5)
Chloride: 99 mEq/L (ref 96–112)
Creatinine, Ser: 0.47 mg/dL (ref 0.40–1.20)
GFR: 97.96 mL/min (ref >60.00)
Glucose, Bld: 98 mg/dL (ref 70–99)
Potassium: 4.1 mEq/L (ref 3.5–5.1)
Sodium: 139 mEq/L (ref 135–145)

## 2020-12-13 LAB — HEPATIC FUNCTION PANEL
ALT: 26 U/L (ref 0–35)
AST: 24 U/L (ref 0–37)
Albumin: 4.5 g/dL (ref 3.5–5.2)
Alkaline Phosphatase: 77 U/L (ref 39–117)
Bilirubin, Direct: 0.2 mg/dL (ref 0.0–0.3)
Total Bilirubin: 0.8 mg/dL (ref 0.2–1.2)
Total Protein: 7.5 g/dL (ref 6.0–8.3)

## 2020-12-13 LAB — LIPID PANEL
Cholesterol: 246 mg/dL — ABNORMAL HIGH (ref 0–200)
HDL: 88.7 mg/dL (ref >39.00)
LDL Cholesterol: 141 mg/dL — ABNORMAL HIGH (ref 0–99)
NonHDL: 157.36
Total CHOL/HDL Ratio: 3
Triglycerides: 83 mg/dL (ref 0.0–149.0)
VLDL: 16.6 mg/dL (ref 0.0–40.0)

## 2020-12-13 LAB — VITAMIN D 25 HYDROXY (VIT D DEFICIENCY, FRACTURES): VITD: 50.73 ng/mL (ref 30.00–100.00)

## 2020-12-13 LAB — T4, FREE: Free T4: 1.01 ng/dL (ref 0.60–1.60)

## 2020-12-13 NOTE — Patient Instructions (Addendum)
Good to see you today.  Will notify you  of labs when available.   Consideration of bone building therapy   depending .  Can get  prevnar  13 in future .   Check bpo readings  for 2-3 days at home and send in readings .   Goal is 130/80 or below    Health Maintenance, Female Adopting a healthy lifestyle and getting preventive care are important in promoting health and wellness. Ask your health care provider about: The right schedule for you to have regular tests and exams. Things you can do on your own to prevent diseases and keep yourself healthy. What should I know about diet, weight, and exercise? Eat a healthy diet  Eat a diet that includes plenty of vegetables, fruits, low-fat dairy products, and lean protein. Do not eat a lot of foods that are high in solid fats, added sugars, or sodium.  Maintain a healthy weight Body mass index (BMI) is used to identify weight problems. It estimates body fat based on height and weight. Your health care provider can help determineyour BMI and help you achieve or maintain a healthy weight. Get regular exercise Get regular exercise. This is one of the most important things you can do for your health. Most adults should: Exercise for at least 150 minutes each week. The exercise should increase your heart rate and make you sweat (moderate-intensity exercise). Do strengthening exercises at least twice a week. This is in addition to the moderate-intensity exercise. Spend less time sitting. Even light physical activity can be beneficial. Watch cholesterol and blood lipids Have your blood tested for lipids and cholesterol at 68 years of age, then havethis test every 5 years. Have your cholesterol levels checked more often if: Your lipid or cholesterol levels are high. You are older than 68 years of age. You are at high risk for heart disease. What should I know about cancer screening? Depending on your health history and family history, you may need  to have cancer screening at various ages. This may include screening for: Breast cancer. Cervical cancer. Colorectal cancer. Skin cancer. Lung cancer. What should I know about heart disease, diabetes, and high blood pressure? Blood pressure and heart disease High blood pressure causes heart disease and increases the risk of stroke. This is more likely to develop in people who have high blood pressure readings, are of African descent, or are overweight. Have your blood pressure checked: Every 3-5 years if you are 33-36 years of age. Every year if you are 87 years old or older. Diabetes Have regular diabetes screenings. This checks your fasting blood sugar level. Have the screening done: Once every three years after age 41 if you are at a normal weight and have a low risk for diabetes. More often and at a younger age if you are overweight or have a high risk for diabetes. What should I know about preventing infection? Hepatitis B If you have a higher risk for hepatitis B, you should be screened for this virus. Talk with your health care provider to find out if you are at risk forhepatitis B infection. Hepatitis C Testing is recommended for: Everyone born from [birth year] through 1963. Anyone with known risk factors for hepatitis C. Sexually transmitted infections (STIs) Get screened for STIs, including gonorrhea and chlamydia, if: You are sexually active and are younger than 68 years of age. You are older than 68 years of age and your health care provider tells you that you are at  risk for this type of infection. Your sexual activity has changed since you were last screened, and you are at increased risk for chlamydia or gonorrhea. Ask your health care provider if you are at risk. Ask your health care provider about whether you are at high risk for HIV. Your health care provider may recommend a prescription medicine to help prevent HIV infection. If you choose to take medicine to prevent HIV, you  should first get tested for HIV. You should then be tested every 3 months for as long as you are taking the medicine. Pregnancy If you are about to stop having your period (premenopausal) and you may become pregnant, seek counseling before you get pregnant. Take 400 to 800 micrograms (mcg) of folic acid every day if you become pregnant. Ask for birth control (contraception) if you want to prevent pregnancy. Osteoporosis and menopause Osteoporosis is a disease in which the bones lose minerals and strength with aging. This can result in bone fractures. If you are 46 years old or older, or if you are at risk for osteoporosis and fractures, ask your health care provider if you should: Be screened for bone loss. Take a calcium or vitamin D supplement to lower your risk of fractures. Be given hormone replacement therapy (HRT) to treat symptoms of menopause. Follow these instructions at home: Lifestyle Do not use any products that contain nicotine or tobacco, such as cigarettes, e-cigarettes, and chewing tobacco. If you need help quitting, ask your health care provider. Do not use street drugs. Do not share needles. Ask your health care provider for help if you need support or information about quitting drugs. Alcohol use Do not drink alcohol if: Your health care provider tells you not to drink. You are pregnant, may be pregnant, or are planning to become pregnant. If you drink alcohol: Limit how much you use to 0-1 drink a day. Limit intake if you are breastfeeding. Be aware of how much alcohol is in your drink. In the [place] one drink equals one 12 oz bottle of beer (355 mL), one 5 oz glass of wine (148 mL), or one 1 oz glass of hard liquor (44 mL). General instructions Schedule regular health, dental, and eye exams. Stay current with your vaccines. Tell your health care provider if: You often feel depressed. You have ever been abused or do not feel safe at home. Summary Adopting a healthy  lifestyle and getting preventive care are important in promoting health and wellness. Follow your health care provider's instructions about healthy diet, exercising, and getting tested or screened for diseases. Follow your health care provider's instructions on monitoring your cholesterol and blood pressure. This information is not intended to replace advice given to you by your health care provider. Make sure you discuss any questions you have with your healthcare provider. Document Revised: 04/29/2018 Document Reviewed: 04/29/2018 Elsevier Patient Education  2022 [employer].

## 2020-12-14 LAB — PTH, INTACT AND CALCIUM
Calcium: 9.9 mg/dL (ref 8.6–10.4)
PTH: 23 pg/mL (ref 16–77)

## 2020-12-14 LAB — URINE CULTURE
MICRO NUMBER:: 12169402
Result:: NO GROWTH
SPECIMEN QUALITY:: ADEQUATE

## 2020-12-15 NOTE — Progress Notes (Signed)
NO UTI.   Cholesterol up som, rst of labs all in range  forwarding  results to [facility]

## 2021-02-13 LAB — HM MAMMOGRAPHY

## 2021-02-15 ENCOUNTER — Encounter: Payer: Self-pay | Admitting: Internal Medicine

## 2021-12-18 NOTE — Progress Notes (Unsigned)
No chief complaint on file.   HPI: Patient  Linda Richard  71 y.o. comes in today for Preventive Health Care visit   Health Maintenance  Topic Date Due   Hepatitis C Screening  Never done   Zoster Vaccines- Shingrix (2 of 2) 08/05/2019   Pneumonia Vaccine 58+ Years old (2 - PCV) 11/13/2019   COVID-19 Vaccine (4 - Booster for Pfizer series) 07/02/2020   INFLUENZA VACCINE  12/18/2021   MAMMOGRAM  02/14/2023   COLONOSCOPY (Pts 45-46yrs Insurance coverage will need to be confirmed)  09/29/2024   TETANUS/TDAP  04/03/2025   DEXA SCAN  Completed   HPV VACCINES  Aged Out   Health Maintenance Review LIFESTYLE:  Exercise:   Tobacco/ETS: Alcohol:  Sugar beverages: Sleep: Drug use: no HH of  Work:    ROS:  GEN/ HEENT: No fever, significant weight changes sweats headaches vision problems hearing changes, CV/ PULM; No chest pain shortness of breath cough, syncope,edema  change in exercise tolerance. GI /GU: No adominal pain, vomiting, change in bowel habits. No blood in the stool. No significant GU symptoms. SKIN/HEME: ,no acute skin rashes suspicious lesions or bleeding. No lymphadenopathy, nodules, masses.  NEURO/ PSYCH:  No neurologic signs such as weakness numbness. No depression anxiety. IMM/ Allergy: No unusual infections.  Allergy .   REST of 12 system review negative except as per HPI   Past Medical History:  Diagnosis Date   Anxiety    takes Lexapro daily-just started 01/19/15   BACK PAIN 01/24/2009   Qualifier: Diagnosis of  By: Fabian Sharp MD, Neta Mends Uses inversion therapy and doing very well    Diverticulitis of intestine with perforation 10/06/2014   DVT (deep vein thrombosis) in pregnancy 1983   behind left leg   History of kidney stones 2011   History of shingles    Joint pain    NEPHROLITHIASIS 05/02/2010   Qualifier: Diagnosis of  By: Fabian Sharp MD, Neta Mends    Pneumonia as a child   Urinary frequency    Urinary urgency    just started taking Myrbetriq    Varicose veins    strip and laser    Past Surgical History:  Procedure Laterality Date   CESAREAN SECTION  1983   CHOLECYSTECTOMY N/A 01/30/2015   Procedure: LAPAROSCOPIC CHOLECYSTECTOMY;  Surgeon: Emelia Loron, MD;  Location: [facility];  Service: General;  Laterality: N/A;   COLONOSCOPY     OPEN REDUCTION INTERNAL FIXATION (ORIF) DISTAL RADIAL FRACTURE Left 12/04/2020   Procedure: OPEN TREATMENT OF DISPLACED LEFT DISTAL RADIUS FRACTURE;  Surgeon: Mack Hook, MD;  Location: [facility];  Service: Orthopedics;  Laterality: Left;   TONSILLECTOMY  as a child   veins stripped  80's   laser RX     Family History  Problem Relation Age of Onset   Hypertension Other    Kidney disease Other        renal stones   Arthritis Other    Cancer Father        bladder   57s    Ovarian cancer Mother        37s   Other Other        son age 75 irreg HB  had cardioversion    Thyroid disease Other    Colon polyps Neg Hx    Colon cancer Neg Hx    Rectal cancer Neg Hx    Stomach cancer Neg Hx     Social History   Socioeconomic History  Marital status: Married    Spouse name: Not on file   Number of children: Not on file   Years of education: Not on file   Highest education level: Not on file  Occupational History   Not on file  Tobacco Use   Smoking status: Never   Smokeless tobacco: Never  Vaping Use   Vaping Use: Never used  Substance and Sexual Activity   Alcohol use: Yes    Alcohol/week: 0.0 standard drinks of alcohol    Comment: 3 x a week, mostly wine no t daily]   Drug use: No   Sexual activity: Yes    Birth control/protection: Post-menopausal  Other Topics Concern   Not on file  Social History Narrative   HH of 2    Grandchild  Sons    reitred [employer] Child Support   Married.    No  ets tobacco No  Exercise restriction   etoh 2 x per week.    Exercise good.       G3P2   Social Determinants of Health   Financial Resource Strain: Not on file  Food  Insecurity: Not on file  Transportation Needs: Not on file  Physical Activity: Not on file  Stress: Not on file  Social Connections: Not on file    Outpatient Medications Prior to Visit  Medication Sig Dispense Refill   acetaminophen (TYLENOL) 325 MG tablet Take 2 tablets (650 mg total) by mouth every 6 (six) hours.     aspirin 81 MG tablet Take 81 mg by mouth daily.       busPIRone (BUSPAR) 7.5 MG tablet Take 7.5 mg twice daily     calcium-vitamin D (OSCAL WITH D) 500-200 MG-UNIT per tablet Take 1 tablet by mouth daily.       escitalopram (LEXAPRO) 10 MG tablet Take 10 mg orally daily     estradiol (ESTRACE) 0.1 MG/GM vaginal cream Place 1 g vaginally 2 (two) times a week. Monday and Friday     ibuprofen (ADVIL) 200 MG tablet Take 3 tablets (600 mg total) by mouth every 6 (six) hours.     Multiple Vitamins-Minerals (ONE DAILY WOMENS 50+ PO) Take 1 tablet by mouth daily.     oxyCODONE (ROXICODONE) 5 MG immediate release tablet Take 1 tablet (5 mg total) by mouth every 6 (six) hours as needed for severe pain. 20 tablet 0   Probiotic Product (PROBIOTIC DAILY PO) Take by mouth.     tretinoin (RETIN-A) 0.025 % cream as needed.  99   No facility-administered medications prior to visit.     EXAM:  There were no vitals taken for this visit.  There is no height or weight on file to calculate BMI. Wt Readings from Last 3 Encounters:  12/13/20 126 lb (57.2 kg)  12/04/20 128 lb 12 oz (58.4 kg)  11/25/20 130 lb (59 kg)    Physical Exam: Vital signs reviewed BHA:LPFX is a well-developed well-nourished alert cooperative    who appearsr stated age in no acute distress.  HEENT: normocephalic atraumatic , Eyes: PERRL EOM's full, conjunctiva clear, Nares: paten,t no deformity discharge or tenderness., Ears: no deformity EAC's clear TMs with normal landmarks. Mouth: clear OP, no lesions, edema.  Moist mucous membranes. Dentition in adequate repair. NECK: supple without masses, thyromegaly or  bruits. CHEST/PULM:  Clear to auscultation and percussion breath sounds equal no wheeze , rales or rhonchi. No chest wall deformities or tenderness. Breast: normal by inspection . No dimpling, discharge, masses, tenderness or  discharge . CV: PMI is nondisplaced, S1 S2 no gallops, murmurs, rubs. Peripheral pulses are full without delay.No JVD .  ABDOMEN: Bowel sounds normal nontender  No guard or rebound, no hepato splenomegal no CVA tenderness.  No hernia. Extremtities:  No clubbing cyanosis or edema, no acute joint swelling or redness no focal atrophy NEURO:  Oriented x3, cranial nerves 3-12 appear to be intact, no obvious focal weakness,gait within normal limits no abnormal reflexes or asymmetrical SKIN: No acute rashes normal turgor, color, no bruising or petechiae. PSYCH: Oriented, good eye contact, no obvious depression anxiety, cognition and judgment appear normal. LN: no cervical axillary inguinal adenopathy  Lab Results  Component Value Date   WBC 7.3 12/13/2020   HGB 15.0 12/13/2020   HCT 44.1 12/13/2020   PLT 301.0 12/13/2020   GLUCOSE 98 12/13/2020   CHOL 246 (H) 12/13/2020   TRIG 83.0 12/13/2020   HDL 88.70 12/13/2020   LDLDIRECT 128.8 02/23/2013   LDLCALC 141 (H) 12/13/2020   ALT 26 12/13/2020   AST 24 12/13/2020   NA 139 12/13/2020   K 4.1 12/13/2020   CL 99 12/13/2020   CREATININE 0.47 12/13/2020   BUN 16 12/13/2020   CO2 31 12/13/2020   TSH 0.87 12/13/2020   HGBA1C 5.3 11/13/2018   MICROALBUR <0.7 11/16/2019    BP Readings from Last 3 Encounters:  12/13/20 (!) 146/80  12/04/20 (!) 158/73  11/25/20 135/70    Lab results reviewed with patient   ASSESSMENT AND PLAN:  Discussed the following assessment and plan:  No diagnosis found. No follow-ups on file.  Patient Care Team: Manuel Lawhead, Neta Mends, MD as PCP - General Olivia Mackie, MD (Obstetrics and Gynecology) Loletha Carrow, MD as Consulting Physician (Ophthalmology) Elesa Hacker, MD as  Referring Physician (Dermatology) Lynden Ang, NP as Nurse Practitioner (Obstetrics and Gynecology) There are no Patient Instructions on file for this visit.  Neta Mends. Elohim Brune M.D.

## 2021-12-19 ENCOUNTER — Ambulatory Visit (INDEPENDENT_AMBULATORY_CARE_PROVIDER_SITE_OTHER): Payer: Medicare Other | Admitting: Internal Medicine

## 2021-12-19 ENCOUNTER — Encounter: Payer: Self-pay | Admitting: Internal Medicine

## 2021-12-19 VITALS — BP 126/78 | HR 66 | Temp 98.1°F | Ht 68.5 in | Wt 129.0 lb

## 2021-12-19 DIAGNOSIS — M8580 Other specified disorders of bone density and structure, unspecified site: Secondary | ICD-10-CM | POA: Diagnosis not present

## 2021-12-19 DIAGNOSIS — Z0000 Encounter for general adult medical examination without abnormal findings: Secondary | ICD-10-CM

## 2021-12-19 DIAGNOSIS — Z79899 Other long term (current) drug therapy: Secondary | ICD-10-CM

## 2021-12-19 DIAGNOSIS — Z86718 Personal history of other venous thrombosis and embolism: Secondary | ICD-10-CM

## 2021-12-19 DIAGNOSIS — Z87442 Personal history of urinary calculi: Secondary | ICD-10-CM

## 2021-12-19 DIAGNOSIS — Z23 Encounter for immunization: Secondary | ICD-10-CM | POA: Diagnosis not present

## 2021-12-19 DIAGNOSIS — E785 Hyperlipidemia, unspecified: Secondary | ICD-10-CM | POA: Diagnosis not present

## 2021-12-19 LAB — HEPATIC FUNCTION PANEL
ALT: 20 U/L (ref 0–35)
AST: 27 U/L (ref 0–37)
Albumin: 4.4 g/dL (ref 3.5–5.2)
Alkaline Phosphatase: 49 U/L (ref 39–117)
Bilirubin, Direct: 0.2 mg/dL (ref 0.0–0.3)
Total Bilirubin: 0.7 mg/dL (ref 0.2–1.2)
Total Protein: 7.6 g/dL (ref 6.0–8.3)

## 2021-12-19 LAB — LIPID PANEL
Cholesterol: 251 mg/dL — ABNORMAL HIGH (ref 0–200)
HDL: 100.3 mg/dL (ref >39.00)
LDL Cholesterol: 139 mg/dL — ABNORMAL HIGH (ref 0–99)
NonHDL: 150.91
Total CHOL/HDL Ratio: 3
Triglycerides: 62 mg/dL (ref 0.0–149.0)
VLDL: 12.4 mg/dL (ref 0.0–40.0)

## 2021-12-19 LAB — CBC WITH DIFFERENTIAL/PLATELET
Basophils Absolute: 0 10*3/uL (ref 0.0–0.1)
Basophils Relative: 0.3 % (ref 0.0–3.0)
Eosinophils Absolute: 0.2 10*3/uL (ref 0.0–0.7)
Eosinophils Relative: 3.1 % (ref 0.0–5.0)
HCT: 44.6 % (ref 36.0–46.0)
Hemoglobin: 15.2 g/dL — ABNORMAL HIGH (ref 12.0–15.0)
Lymphocytes Relative: 22.5 % (ref 12.0–46.0)
Lymphs Abs: 1.4 10*3/uL (ref 0.7–4.0)
MCHC: 34.1 g/dL (ref 30.0–36.0)
MCV: 92.8 fl (ref 78.0–100.0)
Monocytes Absolute: 0.5 10*3/uL (ref 0.1–1.0)
Monocytes Relative: 7.7 % (ref 3.0–12.0)
Neutro Abs: 4.1 10*3/uL (ref 1.4–7.7)
Neutrophils Relative %: 66.4 % (ref 43.0–77.0)
Platelets: 191 10*3/uL (ref 150.0–400.0)
RBC: 4.81 Mil/uL (ref 3.87–5.11)
RDW: 12.9 % (ref 11.5–15.5)
WBC: 6.1 10*3/uL (ref 4.0–10.5)

## 2021-12-19 LAB — BASIC METABOLIC PANEL
BUN: 14 mg/dL (ref 6–23)
CO2: 30 mEq/L (ref 19–32)
Calcium: 9.8 mg/dL (ref 8.4–10.5)
Chloride: 100 mEq/L (ref 96–112)
Creatinine, Ser: 0.6 mg/dL (ref 0.40–1.20)
GFR: 91.7 mL/min (ref >60.00)
Glucose, Bld: 97 mg/dL (ref 70–99)
Potassium: 4.1 mEq/L (ref 3.5–5.1)
Sodium: 138 mEq/L (ref 135–145)

## 2021-12-19 NOTE — Progress Notes (Signed)
Cholesterol   about the same   high hdl. Rest of lab   ok

## 2021-12-19 NOTE — Patient Instructions (Signed)
Good to see you today . Labs results willb e on my chart . Continue lifestyle intervention healthy eating and exercise .  Wegiht bearing exercise  .  Prevnar 20 today

## 2022-07-03 ENCOUNTER — Ambulatory Visit (INDEPENDENT_AMBULATORY_CARE_PROVIDER_SITE_OTHER): Payer: Medicare Other | Admitting: Family Medicine

## 2022-07-03 VITALS — BP 126/74 | HR 78 | Temp 99.2°F | Wt 129.0 lb

## 2022-07-03 DIAGNOSIS — J029 Acute pharyngitis, unspecified: Secondary | ICD-10-CM

## 2022-07-03 DIAGNOSIS — R059 Cough, unspecified: Secondary | ICD-10-CM | POA: Diagnosis not present

## 2022-07-03 DIAGNOSIS — J019 Acute sinusitis, unspecified: Secondary | ICD-10-CM

## 2022-07-03 LAB — POC COVID19 BINAXNOW: SARS Coronavirus 2 Ag: NEGATIVE

## 2022-07-03 LAB — POCT RAPID STREP A (OFFICE): Rapid Strep A Screen: NEGATIVE

## 2022-07-03 MED ORDER — AZITHROMYCIN 250 MG PO TABS: ORAL_TABLET | ORAL | 0 refills | Status: DC

## 2022-07-03 NOTE — Progress Notes (Signed)
   Subjective:    Patient ID: BRIONNE MERTZ, female    DOB: [birth year], 72 y.o.   MRN: [id number]  HPI Here for 5 days of sinus congestion, PND, a dry cough. No fever or SOB.    Review of Systems  Constitutional: Negative.   HENT:  Positive for congestion, postnasal drip, sinus pressure and sore throat. Negative for ear pain.   Eyes: Negative.   Respiratory:  Positive for cough. Negative for shortness of breath and wheezing.        Objective:   Physical Exam Constitutional:      Appearance: Normal appearance. She is not ill-appearing.  HENT:     Right Ear: Tympanic membrane, ear canal and external ear normal.     Left Ear: Tympanic membrane, ear canal and external ear normal.     Nose: Nose normal.     Mouth/Throat:     Pharynx: Oropharynx is clear.  Eyes:     Conjunctiva/sclera: Conjunctivae normal.  Pulmonary:     Effort: Pulmonary effort is normal.     Breath sounds: Normal breath sounds.  Lymphadenopathy:     Cervical: No cervical adenopathy.  Neurological:     Mental Status: She is alert.           Assessment & Plan:  Sinusitis, treat with a Zpack. Add Mucinex BID. Alysia Penna, MD

## 2022-09-28 ENCOUNTER — Other Ambulatory Visit: Payer: Self-pay

## 2022-09-28 ENCOUNTER — Inpatient Hospital Stay (HOSPITAL_COMMUNITY)
Admission: EM | Admit: 2022-09-28 | Discharge: 2022-09-30 | DRG: 399 | Disposition: A | Discharge status: Home / Self Care | Payer: Medicare Other | Attending: Surgery | Admitting: Surgery

## 2022-09-28 ENCOUNTER — Ambulatory Visit (INDEPENDENT_AMBULATORY_CARE_PROVIDER_SITE_OTHER): Payer: Medicare Other

## 2022-09-28 ENCOUNTER — Telehealth: Payer: Self-pay | Admitting: Urgent Care

## 2022-09-28 ENCOUNTER — Encounter (HOSPITAL_COMMUNITY): Payer: Self-pay | Admitting: *Deleted

## 2022-09-28 ENCOUNTER — Ambulatory Visit (INDEPENDENT_AMBULATORY_CARE_PROVIDER_SITE_OTHER)
Admission: EM | Admit: 2022-09-28 | Discharge: 2022-09-28 | Disposition: A | Discharge status: Home / Self Care | Payer: Medicare Other | Source: Home / Self Care

## 2022-09-28 ENCOUNTER — Emergency Department (HOSPITAL_COMMUNITY): Payer: Medicare Other

## 2022-09-28 ENCOUNTER — Ambulatory Visit
Admission: RE | Admit: 2022-09-28 | Discharge: 2022-09-28 | Disposition: A | Discharge status: Home / Self Care | Payer: Medicare Other | Source: Ambulatory Visit | Attending: Urgent Care | Admitting: Urgent Care

## 2022-09-28 VITALS — BP 115/66 | HR 87 | Temp 97.8°F | Resp 18

## 2022-09-28 DIAGNOSIS — R109 Unspecified abdominal pain: Secondary | ICD-10-CM | POA: Insufficient documentation

## 2022-09-28 DIAGNOSIS — R112 Nausea with vomiting, unspecified: Secondary | ICD-10-CM

## 2022-09-28 DIAGNOSIS — R197 Diarrhea, unspecified: Secondary | ICD-10-CM | POA: Diagnosis not present

## 2022-09-28 DIAGNOSIS — R1084 Generalized abdominal pain: Secondary | ICD-10-CM

## 2022-09-28 DIAGNOSIS — K3580 Unspecified acute appendicitis: Secondary | ICD-10-CM | POA: Diagnosis present

## 2022-09-28 DIAGNOSIS — M47816 Spondylosis without myelopathy or radiculopathy, lumbar region: Secondary | ICD-10-CM | POA: Insufficient documentation

## 2022-09-28 DIAGNOSIS — K35891 Other acute appendicitis without perforation, with gangrene: Secondary | ICD-10-CM | POA: Diagnosis not present

## 2022-09-28 DIAGNOSIS — Z9049 Acquired absence of other specified parts of digestive tract: Secondary | ICD-10-CM

## 2022-09-28 DIAGNOSIS — Z79899 Other long term (current) drug therapy: Secondary | ICD-10-CM

## 2022-09-28 DIAGNOSIS — Z79890 Hormone replacement therapy (postmenopausal): Secondary | ICD-10-CM

## 2022-09-28 DIAGNOSIS — K3531 Acute appendicitis with localized peritonitis and gangrene, without perforation: Principal | ICD-10-CM | POA: Diagnosis present

## 2022-09-28 DIAGNOSIS — Z87442 Personal history of urinary calculi: Secondary | ICD-10-CM

## 2022-09-28 DIAGNOSIS — Z7982 Long term (current) use of aspirin: Secondary | ICD-10-CM

## 2022-09-28 DIAGNOSIS — R3915 Urgency of urination: Secondary | ICD-10-CM | POA: Diagnosis present

## 2022-09-28 DIAGNOSIS — Z86718 Personal history of other venous thrombosis and embolism: Secondary | ICD-10-CM

## 2022-09-28 DIAGNOSIS — F419 Anxiety disorder, unspecified: Secondary | ICD-10-CM | POA: Diagnosis present

## 2022-09-28 DIAGNOSIS — R143 Flatulence: Secondary | ICD-10-CM | POA: Insufficient documentation

## 2022-09-28 HISTORY — DX: Unspecified acute appendicitis: K35.80

## 2022-09-28 LAB — URINALYSIS, W/ REFLEX TO CULTURE (INFECTION SUSPECTED)
Bilirubin Urine: NEGATIVE
Glucose, UA: NEGATIVE mg/dL
Ketones, ur: 20 mg/dL — AB
Leukocytes,Ua: NEGATIVE
Nitrite: NEGATIVE
Protein, ur: NEGATIVE mg/dL
Specific Gravity, Urine: 1.003 — ABNORMAL LOW (ref 1.005–1.030)
pH: 6 (ref 5.0–8.0)

## 2022-09-28 LAB — LIPASE, BLOOD: Lipase: 37 U/L (ref 11–51)

## 2022-09-28 LAB — COMPREHENSIVE METABOLIC PANEL
ALT: 43 U/L (ref 0–44)
AST: 54 U/L — ABNORMAL HIGH (ref 15–41)
Albumin: 4 g/dL (ref 3.5–5.0)
Alkaline Phosphatase: 68 U/L (ref 38–126)
Anion gap: 12 (ref 5–15)
BUN: 8 mg/dL (ref 8–23)
CO2: 28 mmol/L (ref 22–32)
Calcium: 9.7 mg/dL (ref 8.9–10.3)
Chloride: 97 mmol/L — ABNORMAL LOW (ref 98–111)
Creatinine, Ser: 0.56 mg/dL (ref 0.44–1.00)
GFR, Estimated: >60 mL/min (ref >60)
Glucose, Bld: 126 mg/dL — ABNORMAL HIGH (ref 70–99)
Potassium: 3.9 mmol/L (ref 3.5–5.1)
Sodium: 137 mmol/L (ref 135–145)
Total Bilirubin: 1.5 mg/dL — ABNORMAL HIGH (ref 0.3–1.2)
Total Protein: 7.8 g/dL (ref 6.5–8.1)

## 2022-09-28 LAB — CBC
HCT: 45.8 % (ref 36.0–46.0)
Hemoglobin: 15.5 g/dL — ABNORMAL HIGH (ref 12.0–15.0)
MCH: 31.9 pg (ref 26.0–34.0)
MCHC: 33.8 g/dL (ref 30.0–36.0)
MCV: 94.2 fL (ref 80.0–100.0)
Platelets: 181 10*3/uL (ref 150–400)
RBC: 4.86 MIL/uL (ref 3.87–5.11)
RDW: 12.7 % (ref 11.5–15.5)
WBC: 14.6 10*3/uL — ABNORMAL HIGH (ref 4.0–10.5)
nRBC: 0 % (ref 0.0–0.2)

## 2022-09-28 MED ORDER — METRONIDAZOLE 500 MG/100ML IV SOLN
500.0000 mg | Freq: Two times a day (BID) | INTRAVENOUS | Status: DC
  Administered 2022-09-28 – 2022-09-30 (×4): 500 mg via INTRAVENOUS
  Filled 2022-09-28 (×4): qty 100

## 2022-09-28 MED ORDER — PIPERACILLIN-TAZOBACTAM 3.375 G IVPB: 3.3750 g | Freq: Three times a day (TID) | INTRAVENOUS | Status: DC

## 2022-09-28 MED ORDER — OXYCODONE HCL 5 MG PO TABS: 5.0000 mg | ORAL_TABLET | ORAL | Status: DC | PRN

## 2022-09-28 MED ORDER — ONDANSETRON 4 MG PO TBDP: 4.0000 mg | ORAL_TABLET | Freq: Four times a day (QID) | ORAL | Status: DC | PRN

## 2022-09-28 MED ORDER — DOCUSATE SODIUM 100 MG PO CAPS
100.0000 mg | ORAL_CAPSULE | Freq: Two times a day (BID) | ORAL | Status: DC
  Administered 2022-09-28 – 2022-09-30 (×4): 100 mg via ORAL
  Filled 2022-09-28 (×4): qty 1

## 2022-09-28 MED ORDER — ONDANSETRON HCL 4 MG/2ML IJ SOLN: 4.0000 mg | Freq: Four times a day (QID) | INTRAMUSCULAR | Status: DC | PRN

## 2022-09-28 MED ORDER — METHOCARBAMOL 500 MG PO TABS
1000.0000 mg | ORAL_TABLET | Freq: Three times a day (TID) | ORAL | Status: DC
  Administered 2022-09-28 – 2022-09-30 (×5): 1000 mg via ORAL
  Filled 2022-09-28 (×5): qty 2

## 2022-09-28 MED ORDER — ENOXAPARIN SODIUM 40 MG/0.4ML IJ SOSY
40.0000 mg | PREFILLED_SYRINGE | INTRAMUSCULAR | Status: DC
  Administered 2022-09-29 – 2022-09-30 (×2): 40 mg via SUBCUTANEOUS
  Filled 2022-09-28 (×2): qty 0.4

## 2022-09-28 MED ORDER — LACTATED RINGERS IV SOLN: INTRAVENOUS | Status: DC

## 2022-09-28 MED ORDER — PIPERACILLIN-TAZOBACTAM 3.375 G IVPB 30 MIN: 3.3750 g | Freq: Once | INTRAVENOUS | Status: DC

## 2022-09-28 MED ORDER — SODIUM CHLORIDE 0.9 % IV SOLN
2.0000 g | INTRAVENOUS | Status: DC
  Administered 2022-09-28 – 2022-09-29 (×2): 2 g via INTRAVENOUS
  Filled 2022-09-28 (×2): qty 20

## 2022-09-28 MED ORDER — MORPHINE SULFATE (PF) 2 MG/ML IV SOLN
2.0000 mg | INTRAVENOUS | Status: DC | PRN
  Administered 2022-09-28: 2 mg via INTRAVENOUS
  Filled 2022-09-28: qty 1

## 2022-09-28 MED ORDER — IOHEXOL 350 MG/ML SOLN
75.0000 mL | Freq: Once | INTRAVENOUS | Status: AC | PRN
  Administered 2022-09-28: 75 mL via INTRAVENOUS

## 2022-09-28 MED ORDER — ONDANSETRON 4 MG PO TBDP: 4.0000 mg | ORAL_TABLET | Freq: Three times a day (TID) | ORAL | 0 refills | Status: DC | PRN

## 2022-09-28 MED ORDER — KETOROLAC TROMETHAMINE 15 MG/ML IJ SOLN
30.0000 mg | Freq: Four times a day (QID) | INTRAMUSCULAR | Status: DC
  Administered 2022-09-28 – 2022-09-29 (×2): 30 mg via INTRAVENOUS
  Filled 2022-09-28 (×2): qty 2

## 2022-09-28 MED ORDER — LACTATED RINGERS IV BOLUS
1000.0000 mL | Freq: Once | INTRAVENOUS | Status: AC
  Administered 2022-09-28: 1000 mL via INTRAVENOUS

## 2022-09-28 MED ORDER — ONDANSETRON 4 MG PO TBDP
4.0000 mg | ORAL_TABLET | Freq: Once | ORAL | Status: AC
  Administered 2022-09-28: 4 mg via ORAL

## 2022-09-28 MED ORDER — MUPIROCIN 2 % EX OINT
1.0000 | TOPICAL_OINTMENT | Freq: Two times a day (BID) | CUTANEOUS | Status: DC
  Administered 2022-09-29 – 2022-09-30 (×3): 1 via NASAL
  Filled 2022-09-28 (×3): qty 22

## 2022-09-28 MED ORDER — ACETAMINOPHEN 500 MG PO TABS
1000.0000 mg | ORAL_TABLET | Freq: Four times a day (QID) | ORAL | Status: DC
  Administered 2022-09-28 – 2022-09-30 (×5): 1000 mg via ORAL
  Filled 2022-09-28 (×7): qty 2

## 2022-09-28 NOTE — ED Provider Notes (Signed)
[place] EMERGENCY DEPARTMENT AT [facility] Provider Note   CSN: [id number] Arrival date & time: 09/28/22  1743     History {Add pertinent medical, surgical, social history, OB history to HPI:1} Chief Complaint  Patient presents with   Abdominal Pain    Linda Richard is a 72 y.o. female.  HPI     72yo female with history of nephrolithiasis, DVT in pregnancy presents with concern for abdominal pain and nausea.  Nausea with one episode of emesis Thursday> Low appetite. Had 4 stool in one day but not diarrhea.  Abdomen constant aching bilateral lower, suprapubic area.     Past Medical History:  Diagnosis Date   Anxiety    takes Lexapro daily-just started 01/19/15   BACK PAIN 01/24/2009   Qualifier: Diagnosis of  By: Fabian Sharp MD, Neta Mends Uses inversion therapy and doing very well    Diverticulitis of intestine with perforation 10/06/2014   DVT (deep vein thrombosis) in pregnancy 1983   behind left leg   History of kidney stones 2011   History of shingles    Joint pain    NEPHROLITHIASIS 05/02/2010   Qualifier: Diagnosis of  By: Fabian Sharp MD, Neta Mends    Pneumonia as a child   Urinary frequency    Urinary urgency    just started taking Myrbetriq   Varicose veins    strip and laser     Home Medications Prior to Admission medications   Medication Sig Start Date End Date Taking? Authorizing Provider  aspirin 81 MG tablet Take 81 mg by mouth daily.      [provider]  busPIRone (BUSPAR) 7.5 MG tablet Take 7.5 mg twice daily 05/10/16   Olivia Mackie, MD  calcium-vitamin D (OSCAL WITH D) 500-200 MG-UNIT per tablet Take 1 tablet by mouth daily.      [provider]  escitalopram (LEXAPRO) 10 MG tablet Take 10 mg orally daily 05/10/16   Olivia Mackie, MD  estradiol (ESTRACE) 0.1 MG/GM vaginal cream Place 1 g vaginally 2 (two) times a week. Monday and Friday    [provider]  Multiple Vitamins-Minerals (ONE DAILY WOMENS 50+ PO) Take 1  tablet by mouth daily.    [provider]  ondansetron (ZOFRAN-ODT) 4 MG disintegrating tablet Take 1 tablet (4 mg total) by mouth every 8 (eight) hours as needed for nausea or vomiting. 09/28/22   Crain, Whitney L, PA  Probiotic Product (PROBIOTIC DAILY PO) Take by mouth.    [provider]  tretinoin (RETIN-A) 0.025 % cream as needed. 07/05/17   [provider]      Allergies    Patient has no known allergies.    Review of Systems   Review of Systems  Physical Exam Updated Vital Signs BP 139/63 (BP Location: Right Arm)   Pulse 91   Temp (!) 97.5 F (36.4 C) (Oral)   Resp 18   Ht 5' 8.5" (1.74 m)   Wt 58.5 kg   SpO2 96%   BMI 19.32 kg/m  Physical Exam  ED Results / Procedures / Treatments   Labs (all labs ordered are listed, but only abnormal results are displayed) Labs Reviewed - No data to display  EKG None  Radiology DG Abd 2 Views  Result Date: 09/28/2022 CLINICAL DATA:  Decreased bowel sounds. EXAM: ABDOMEN - 2 VIEW COMPARISON:  October 21, 2014 FINDINGS: The lung bases are normal. No free air, portal venous gas, or pneumatosis. No renal or ureteral stones are  identified. There is a relative paucity of bowel gas with no dilated loops of small bowel or colon. Scoliotic curvature of the lumbar spine, apex to the right. Degenerative changes in the lumbar spine with osteophytes. No other abnormalities. IMPRESSION: 1. There is a relative paucity of bowel gas with no dilated loops of small bowel or colon. No other acute abnormalities. 2. Scoliotic curvature and degenerative changes in the lumbar spine. Electronically Signed   By: Gerome Sam III M.D.   On: 09/28/2022 14:55    Procedures Procedures  {Document cardiac monitor, telemetry assessment procedure when appropriate:1}  Medications Ordered in ED Medications - No data to display  ED Course/ Medical Decision Making/ A&P   {   Click here for ABCD2, HEART and other calculatorsREFRESH Note  before signing :1}                          Medical Decision Making Amount and/or Complexity of Data Reviewed Radiology: ordered.   ***  {Document critical care time when appropriate:1} {Document review of labs and clinical decision tools ie heart score, Chads2Vasc2 etc:1}  {Document your independent review of radiology images, and any outside records:1} {Document your discussion with family members, caretakers, and with consultants:1} {Document social determinants of health affecting pt's care:1} {Document your decision making why or why not admission, treatments were needed:1} Final Clinical Impression(s) / ED Diagnoses Final diagnoses:  None    Rx / DC Orders ED Discharge Orders     None

## 2022-09-28 NOTE — H&P (Signed)
Reason for Consult/Chief Complaint:*** Consultant: ***, {Blank single:19197::"DO","PA","MD"}  Linda Richard is an 72 y.o. female.   HPI: ***  Past Medical History:  Diagnosis Date   Anxiety    takes Lexapro daily-just started 01/19/15   BACK PAIN 01/24/2009   Qualifier: Diagnosis of  By: Fabian Sharp MD, Neta Mends Uses inversion therapy and doing very well    Diverticulitis of intestine with perforation 10/06/2014   DVT (deep vein thrombosis) in pregnancy 1983   behind left leg   History of kidney stones 2011   History of shingles    Joint pain    NEPHROLITHIASIS 05/02/2010   Qualifier: Diagnosis of  By: Fabian Sharp MD, Neta Mends    Pneumonia as a child   Urinary frequency    Urinary urgency    just started taking Myrbetriq   Varicose veins    strip and laser    Past Surgical History:  Procedure Laterality Date   CESAREAN SECTION  1983   CHOLECYSTECTOMY N/A 01/30/2015   Procedure: LAPAROSCOPIC CHOLECYSTECTOMY;  Surgeon: Emelia Loron, MD;  Location: [facility] OR;  Service: General;  Laterality: N/A;   COLONOSCOPY     OPEN REDUCTION INTERNAL FIXATION (ORIF) DISTAL RADIAL FRACTURE Left 12/04/2020   Procedure: OPEN TREATMENT OF DISPLACED LEFT DISTAL RADIUS FRACTURE;  Surgeon: Mack Hook, MD;  Location:  SURGERY CENTER;  Service: Orthopedics;  Laterality: Left;   TONSILLECTOMY  as a child   veins stripped  80's   laser RX     Family History  Problem Relation Age of Onset   Hypertension Other    Kidney disease Other        renal stones   Arthritis Other    Cancer Father        bladder   75s    Ovarian cancer Mother        30s   Other Other        son age 76 irreg HB  had cardioversion    Thyroid disease Other    Colon polyps Neg Hx    Colon cancer Neg Hx    Rectal cancer Neg Hx    Stomach cancer Neg Hx     Social History:  reports that she has never smoked. She has never used smokeless tobacco. She reports current alcohol use. She reports that she does not use  drugs.  Allergies: No Known Allergies  Medications: I have reviewed the patient's current medications.  Results for orders placed or performed during the hospital encounter of 09/28/22 (from the past 48 hour(s))  Urinalysis, w/ Reflex to Culture (Infection Suspected) -Urine, Clean Catch     Status: Abnormal   Collection Time: 09/28/22  7:16 PM  Result Value Ref Range   Specimen Source URINE, CLEAN CATCH    Color, Urine STRAW (A) YELLOW   APPearance CLEAR CLEAR   Specific Gravity, Urine 1.003 (L) 1.005 - 1.030   pH 6.0 5.0 - 8.0   Glucose, UA NEGATIVE NEGATIVE mg/dL   Hgb urine dipstick MODERATE (A) NEGATIVE   Bilirubin Urine NEGATIVE NEGATIVE   Ketones, ur 20 (A) NEGATIVE mg/dL   Protein, ur NEGATIVE NEGATIVE mg/dL   Nitrite NEGATIVE NEGATIVE   Leukocytes,Ua NEGATIVE NEGATIVE   RBC / HPF 0-5 0 - 5 RBC/hpf   WBC, UA 0-5 0 - 5 WBC/hpf    Comment:        Reflex urine culture not performed if WBC <=10, OR if Squamous epithelial cells >5. If Squamous epithelial cells >  5 suggest recollection.    Bacteria, UA RARE (A) NONE SEEN   Squamous Epithelial / HPF 0-5 0 - 5 /HPF    Comment: Performed at [facility], [address] [place], [place] [address]    CT ABDOMEN PELVIS W CONTRAST  Result Date: 09/28/2022 CLINICAL DATA:  Right lower quadrant pain EXAM: CT ABDOMEN AND PELVIS WITH CONTRAST TECHNIQUE: Multidetector CT imaging of the abdomen and pelvis was performed using the standard protocol following bolus administration of intravenous contrast. RADIATION DOSE REDUCTION: This exam was performed according to the departmental dose-optimization program which includes automated exposure control, adjustment of the mA and/or kV according to patient size and/or use of iterative reconstruction technique. CONTRAST:  75mL OMNIPAQUE IOHEXOL 350 MG/ML SOLN COMPARISON:  CT abdomen and pelvis 09/21/2019 FINDINGS: Lower chest: No acute abnormality. Hepatobiliary: No focal liver abnormality is  seen. Status post cholecystectomy. No biliary dilatation. Pancreas: Unremarkable. No pancreatic ductal dilatation or surrounding inflammatory changes. Spleen: Normal in size without focal abnormality. Adrenals/Urinary Tract: Adrenal glands are unremarkable. Kidneys are normal, without renal calculi, focal lesion, or hydronephrosis. Bladder is unremarkable. Stomach/Bowel: Appendix is fluid-filled distended measuring up to 9 mm. There is surrounding inflammatory stranding. There is no free air or abscess. There is no bowel obstruction. Stomach is within normal limits. There is sigmoid colon diverticulosis. Vascular/Lymphatic: Aortic atherosclerosis. No enlarged abdominal or pelvic lymph nodes. Reproductive: Uterus and bilateral adnexa are unremarkable. Other: No abdominal wall hernia or abnormality. No abdominopelvic ascites. Musculoskeletal: Multilevel degenerative changes affect the spine. IMPRESSION: 1. Acute appendicitis. No evidence for perforation or abscess. 2. Sigmoid colon diverticulosis. Aortic Atherosclerosis (ICD10-I70.0). Electronically Signed   By: Darliss Cheney M.D.   On: 09/28/2022 21:07   DG Abd 2 Views  Result Date: 09/28/2022 CLINICAL DATA:  Decreased bowel sounds. EXAM: ABDOMEN - 2 VIEW COMPARISON:  October 21, 2014 FINDINGS: The lung bases are normal. No free air, portal venous gas, or pneumatosis. No renal or ureteral stones are identified. There is a relative paucity of bowel gas with no dilated loops of small bowel or colon. Scoliotic curvature of the lumbar spine, apex to the right. Degenerative changes in the lumbar spine with osteophytes. No other abnormalities. IMPRESSION: 1. There is a relative paucity of bowel gas with no dilated loops of small bowel or colon. No other acute abnormalities. 2. Scoliotic curvature and degenerative changes in the lumbar spine. Electronically Signed   By: Gerome Sam III M.D.   On: 09/28/2022 14:55    ROS 10 point review of systems is negative except  as listed above in HPI.   Physical Exam Blood pressure 139/63, pulse 91, temperature (!) 97.5 F (36.4 C), temperature source Oral, resp. rate 18, height 5' 8.5" (1.74 m), weight 58.5 kg, SpO2 96 %. Constitutional: well-developed, well-nourished*** HEENT: pupils equal, round, reactive to light, 2***mm b/l, moist conjunctiva, external inspection of ears and nose normal, hearing {Blank single:19197::"intact","diminished","unable to be assessed"} Oropharynx: normal oropharyngeal mucosa, {Blank single:19197::"normal","poor"} dentition Neck: no thyromegaly, trachea midline, {Blank single:19197::"+","no","unable to assess"} midline cervical tenderness to palpation Chest: breath sounds equal bilaterally, {Blank single:19197::"normal","absent","labored"} respiratory effort, {Blank single:19197::"+","no"} midline or lateral chest wall tenderness to palpation/deformity Abdomen: soft, NT, no bruising, no hepatosplenomegaly GU: {Blank single:19197::"no blood at urethral meatus of penis, no scrotal masses or abnormality","normal female genitalia"}  Back: no wounds, {Blank single:19197::"+","no","unable to assess"} thoracic/lumbar spine tenderness to palpation, {Blank single:19197::"+","no"} thoracic/lumbar spine stepoffs Rectal: {Blank single:19197::"good tone, no blood","deferred"} Extremities: 2+ radial and pedal pulses bilaterally, {Blank single:19197::"intact","unable  to assess"} motor and sensation bilateral UE and LE, {Blank single:19197::"+","no"} peripheral edema MSK: {Blank single:19197::"normal","abnormal","unable to assess"} gait/station, no clubbing/cyanosis of fingers/toes, {Blank single:19197::"normal","limited","unable to assess"} ROM of all four extremities Skin: warm, dry, no rashes Psych: {Blank single:19197::"normal memory, normal mood/affect","unable to assess"}     Assessment/Plan: ***   Diamantina Monks, MD General and Trauma Surgery [facility]

## 2022-09-28 NOTE — ED Notes (Signed)
Security to walk with pt outside to her car so she may get her overnight bag since she is to be admitted. Pt reports feels okay ambulating, Pt has IV in arm, security to escort her to car and back to department.

## 2022-09-28 NOTE — ED Triage Notes (Signed)
The pt has had abd pain since Thursday  with nausea and vomiting sl loose bms  she was seen at an urgent care that did an abd xray and lab work

## 2022-09-28 NOTE — ED Triage Notes (Signed)
The pt just had blood work 2 hours ago  at urgent care that sent her here for a c-t

## 2022-09-28 NOTE — Telephone Encounter (Signed)
I called pt, obtained two identifiers, to further discuss her labs and xray results. Elevated T bili, AST, WBC count. Xray with paucity of bowel gas; we discussed further evaluation through ER to possibly include CT abd/pelvis given her xray changes, N/V, lab changes from baseline and her hx of perforation of colon. Additionally, pt had decreased to nearly absent bowel sounds on exam concerning for obstruction. Pt aware of results. Options discussed and pt agrees to head to ER for further evaluation. Pt understands failure to obtain further workup can result in detrimental health outcomes. All questions/ concerns addressed. Total call time: 11 minutes.

## 2022-09-28 NOTE — ED Provider Notes (Signed)
[facility]    CSN: [id number] Arrival date & time: 09/28/22  1056      History   Chief Complaint Chief Complaint  Patient presents with   Abdominal Pain    Possibly diverticulitis?  Had in the past. Vomiting Thursday night. Continuous stomach pain no appetite. - Entered by patient    HPI Linda Richard is a 72 y.o. female.   Pleasant 72 year old female presents today due to concerns of abdominal discomfort, nausea, vomiting, and diarrhea.  Symptoms started on Thursday after eating a salad at [employer].  She had blue cheese dressing.  She and her husband both had the same thing but he is not having any GI symptoms.  Patient does report a history of diverticulitis in the past.  She also reports a history of a perforated colon.  She states she is nauseated at present time, but has not had vomiting since Thursday evening.  She reports yesterday she went to the bathroom more often than normal but did not start having diarrhea until today.  She denies a known fever.  She states the pain started originally in the right and left upper quadrants of her abdomen after vomiting on Thursday, and it is now progressed to generally the entire abdomen.  Yesterday all she ate was a few bites of chicken noodle soup, today all she had was 1 piece of dry toast.  States the abdominal discomfort is not positional, and there are no alleviating or aggravating factors.   Abdominal Pain   Past Medical History:  Diagnosis Date   Anxiety    takes Lexapro daily-just started 01/19/15   BACK PAIN 01/24/2009   Qualifier: Diagnosis of  By: Fabian Sharp MD, Neta Mends Uses inversion therapy and doing very well    Diverticulitis of intestine with perforation 10/06/2014   DVT (deep vein thrombosis) in pregnancy 1983   behind left leg   History of kidney stones 2011   History of shingles    Joint pain    NEPHROLITHIASIS 05/02/2010   Qualifier: Diagnosis of  By: Fabian Sharp MD, Neta Mends    Pneumonia as a child   Urinary  frequency    Urinary urgency    just started taking Myrbetriq   Varicose veins    strip and laser    Patient Active Problem List   Diagnosis Date Noted   Hyperlipidemia 08/06/2017   Osteopenia 08/06/2017   Renal calculus or stone 10/18/2014   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   Visit for preventive health examination 03/02/2013   History of renal stone 02/26/2012   Microscopic hematuria 02/26/2012   Preventative health care 02/04/2011   DVT (deep vein thrombosis) in pregnancy    Varicose veins    VENOUS INSUFFICIENCY, CHRONIC 12/01/2007   DEEP VENOUS THROMBOPHLEBITIS, LEFT, LEG, HX OF 12/01/2007    Past Surgical History:  Procedure Laterality Date   CESAREAN SECTION  1983   CHOLECYSTECTOMY N/A 01/30/2015   Procedure: LAPAROSCOPIC CHOLECYSTECTOMY;  Surgeon: Emelia Loron, MD;  Location: [facility];  Service: General;  Laterality: N/A;   COLONOSCOPY     OPEN REDUCTION INTERNAL FIXATION (ORIF) DISTAL RADIAL FRACTURE Left 12/04/2020   Procedure: OPEN TREATMENT OF DISPLACED LEFT DISTAL RADIUS FRACTURE;  Surgeon: Mack Hook, MD;  Location: [facility];  Service: Orthopedics;  Laterality: Left;   TONSILLECTOMY  as a child   veins stripped  80's   laser RX  OB History   No obstetric history on file.      Home Medications    Prior to Admission medications   Medication Sig Start Date End Date Taking? Authorizing Provider  ondansetron (ZOFRAN-ODT) 4 MG disintegrating tablet Take 1 tablet (4 mg total) by mouth every 8 (eight) hours as needed for nausea or vomiting. 09/28/22  Yes Asante Ritacco L, PA  aspirin 81 MG tablet Take 81 mg by mouth daily.      [provider]  busPIRone (BUSPAR) 7.5 MG tablet Take 7.5 mg twice daily 05/10/16   Olivia Mackie, MD  calcium-vitamin D (OSCAL WITH D) 500-200 MG-UNIT per tablet Take 1 tablet by mouth daily.      [provider]  escitalopram (LEXAPRO) 10 MG tablet Take 10 mg orally daily 05/10/16   Olivia Mackie, MD  estradiol (ESTRACE) 0.1 MG/GM vaginal cream Place 1 g vaginally 2 (two) times a week. Monday and Friday    [provider]  Multiple Vitamins-Minerals (ONE DAILY WOMENS 50+ PO) Take 1 tablet by mouth daily.    [provider]  Probiotic Product (PROBIOTIC DAILY PO) Take by mouth.    [provider]  tretinoin (RETIN-A) 0.025 % cream as needed. 07/05/17   [provider]    Family History Family History  Problem Relation Age of Onset   Hypertension Other    Kidney disease Other        renal stones   Arthritis Other    Cancer Father        bladder   82s    Ovarian cancer Mother        36s   Other Other        son age 79 irreg HB  had cardioversion    Thyroid disease Other    Colon polyps Neg Hx    Colon cancer Neg Hx    Rectal cancer Neg Hx    Stomach cancer Neg Hx     Social History Social History   Tobacco Use   Smoking status: Never   Smokeless tobacco: Never  Vaping Use   Vaping Use: Never used  Substance Use Topics   Alcohol use: Yes    Alcohol/week: 0.0 standard drinks of alcohol    Comment: 3 x a week, mostly wine no t daily]   Drug use: No     Allergies   Patient has no known allergies.   Review of Systems Review of Systems  Gastrointestinal:  Positive for abdominal pain.  As per HPI   Physical Exam Triage Vital Signs ED Triage Vitals  Enc Vitals Group     BP 09/28/22 1111 115/66     Pulse Rate 09/28/22 1111 87     Resp 09/28/22 1111 18     Temp 09/28/22 1111 97.8 F (36.6 C)     Temp src --      SpO2 09/28/22 1111 94 %     Weight --      Height --      Head Circumference --      Peak Flow --      Pain Score 09/28/22 1109 9     Pain Loc --      Pain Edu? --      Excl. in GC? --    No data found.  Updated Vital Signs BP 115/66   Pulse 87   Temp 97.8 F (36.6 C)   Resp 18   SpO2 94%    Visual  Acuity Right Eye Distance:   Left Eye Distance:   Bilateral Distance:    Right Eye Near:   Left Eye Near:    Bilateral Near:     Physical Exam Vitals and nursing note reviewed.  Constitutional:      General: She is not in acute distress.    Appearance: She is well-developed and normal weight. She is not ill-appearing, toxic-appearing or diaphoretic.  HENT:     Head: Normocephalic and atraumatic.     Mouth/Throat:     Mouth: Mucous membranes are moist.     Pharynx: Oropharynx is clear. No pharyngeal swelling or oropharyngeal exudate.  Eyes:     General: No scleral icterus.    Extraocular Movements: Extraocular movements intact.     Pupils: Pupils are equal, round, and reactive to light.  Cardiovascular:     Rate and Rhythm: Normal rate and regular rhythm.     Heart sounds: No murmur heard. Pulmonary:     Effort: Pulmonary effort is normal. No respiratory distress.     Breath sounds: Normal breath sounds. No stridor. No wheezing or rhonchi.  Abdominal:     General: Abdomen is flat. Bowel sounds are decreased. There is no distension.     Palpations: Abdomen is soft. There is no shifting dullness, hepatomegaly or splenomegaly.     Tenderness: There is generalized abdominal tenderness. There is no right CVA tenderness, left CVA tenderness, guarding or rebound. Negative signs include Murphy's sign.     Hernia: No hernia is present.     Comments: Tympanic percussion throughout abdomen  Neurological:     Mental Status: She is alert.      UC Treatments / Results  Labs (all labs ordered are listed, but only abnormal results are displayed) Labs Reviewed - No data to display  EKG   Radiology No results found.  Procedures Procedures (including critical care time)  Medications Ordered in UC Medications  ondansetron (ZOFRAN-ODT) disintegrating tablet 4 mg (4 mg Oral Given 09/28/22 1147)    Initial Impression / Assessment and Plan / UC Course  I have reviewed  the triage vital signs and the nursing notes.  Pertinent labs & imaging results that were available during my care of the patient were reviewed by me and considered in my medical decision making (see chart for details).     Nausea, vomiting and diarrhea -the vomiting appears to be improving, the nausea is still present and the diarrhea just started.  This truly favors it to be a viral gastroenteritis versus a foodborne illness.  We administered Zofran here in clinic, will discharge patient home with more Zofran to take as needed and the brat diet. Generalized abdominal discomfort -given her history of diverticulitis and perforated colon in the past, in combination with her decreased bowel sounds, I would like to obtain a KUB and stat labs.  We unfortunately are not able to perform these at [facility] urgent care, thus I have coordinated this to be performed at the [facility] urgent care.  Patient will drive to [facility] urgent care and check in for a nurse visit, to obtain these labs and imaging.  I will personally contact patient upon receipt of the results.  I did encourage patient however should she develop any worsening symptoms, recurring nausea or vomiting, or development of a fever to present to the emergency room. Diarrhea - as above   Final Clinical Impressions(s) / UC Diagnoses   Final diagnoses:  Nausea and vomiting, unspecified vomiting type  Generalized abdominal pain  Diarrhea, unspecified type     Discharge Instructions      Please head to our [facility] urgent care to obtain abdominal imaging and stat labs. Once we get the results, I will contact you via phone call. For the time being, we gave you a Zofran in our office.  I would encourage you to try and eat bland dry foods like dry toast or saltine crackers, and drink fluids such as Gatorade or Pedialyte.  If you develop worsening symptoms or a fever, please head to the emergency room.  I clinically suspect your symptoms to be  viral gastroenteritis which is treated with anti-emetic medication such as zofran, and BRAT diet..     ED Prescriptions     Medication Sig Dispense Auth. Provider   ondansetron (ZOFRAN-ODT) 4 MG disintegrating tablet Take 1 tablet (4 mg total) by mouth every 8 (eight) hours as needed for nausea or vomiting. 20 tablet Ahnna Dungan L, [place]      PDMP not reviewed this encounter.   Maretta Bees, [place] 09/28/22 1219

## 2022-09-28 NOTE — ED Notes (Signed)
Patient seen at the [facility]. Needing Stat Labs and imaging. Providers were notified and orders were placed.

## 2022-09-28 NOTE — ED Notes (Signed)
Pt took her Buspar 7.5 mg as part of her nighttime meds.

## 2022-09-28 NOTE — ED Triage Notes (Signed)
Pt presents today with abdominal pain that she stated started last Thursday. Pt stated she vomit on Last Thursday as well, No appetite at all.

## 2022-09-28 NOTE — ED Notes (Signed)
ED TO INPATIENT HANDOFF REPORT  ED Nurse Name and Phone #: [phone] [facility]  S Name/Age/Gender Linda Richard 72 y.o. female Room/Bed: H015C/H015C  Code Status   Code Status: Full Code  Home/SNF/Other Home Patient oriented to: self, place, time, and situation Is this baseline? Yes   Triage Complete: Triage complete  Chief Complaint Acute appendicitis [K35.80]  Triage Note The pt has had abd pain since Thursday  with nausea and vomiting sl loose bms  she was seen at an urgent care that did an abd xray and lab work  The pt just had blood work 2 hours ago  at urgent care that sent her here for a c-t   Allergies No Known Allergies  Level of Care/Admitting Diagnosis ED Disposition     ED Disposition  Admit   Condition  --   Comment  Hospital Area: [facility] [[id number]]  Level of Care: Med-Surg [16]  May place patient in observation at [facility] or [facility] if equivalent level of care is available:: No  Covid Evaluation: Asymptomatic - no recent exposure (last 10 days) testing not required  Diagnosis: Acute appendicitis [454098]  Admitting Physician: CCS, MD [[id number]]  Attending Physician: CCS, MD [[id number]]          B Medical/Surgery History Past Medical History:  Diagnosis Date   Anxiety    takes Lexapro daily-just started 01/19/15   BACK PAIN 01/24/2009   Qualifier: Diagnosis of  By: Fabian Sharp MD, Neta Mends Uses inversion therapy and doing very well    Diverticulitis of intestine with perforation 10/06/2014   DVT (deep vein thrombosis) in pregnancy 1983   behind left leg   History of kidney stones 2011   History of shingles    Joint pain    NEPHROLITHIASIS 05/02/2010   Qualifier: Diagnosis of  By: Fabian Sharp MD, Neta Mends    Pneumonia as a child   Urinary frequency    Urinary urgency    just started taking Myrbetriq   Varicose veins    strip and laser   Past Surgical History:  Procedure Laterality Date   CESAREAN SECTION  1983   CHOLECYSTECTOMY  N/A 01/30/2015   Procedure: LAPAROSCOPIC CHOLECYSTECTOMY;  Surgeon: Emelia Loron, MD;  Location: [facility] OR;  Service: General;  Laterality: N/A;   COLONOSCOPY     OPEN REDUCTION INTERNAL FIXATION (ORIF) DISTAL RADIAL FRACTURE Left 12/04/2020   Procedure: OPEN TREATMENT OF DISPLACED LEFT DISTAL RADIUS FRACTURE;  Surgeon: Mack Hook, MD;  Location: [facility];  Service: Orthopedics;  Laterality: Left;   TONSILLECTOMY  as a child   veins stripped  80's   laser RX      A IV Location/Drains/Wounds Patient Lines/Drains/Airways Status     Active Line/Drains/Airways     Name Placement date Placement time Site Days   Peripheral IV 09/28/22 20 G Left Antecubital 09/28/22  1937  Antecubital  less than 1   Incision (Closed) 01/30/15 Abdomen Other (Comment) 01/30/15  0816  -- 2798   Incision (Closed) 12/04/20 Arm Left 12/04/20  0945  -- 663   Incision - 4 Ports Abdomen 1: Umbilicus 2: Mid;Upper 3: Right;Medial 4: Right;Lateral 01/30/15  0757  -- 2798            Intake/Output Last 24 hours  Intake/Output Summary (Last 24 hours) at 09/28/2022 2124 Last data filed at 09/28/2022 2044 Gross per 24 hour  Intake 1000 ml  Output --  Net 1000 ml    Labs/Imaging Results for  orders placed or performed during the hospital encounter of 09/28/22 (from the past 48 hour(s))  Urinalysis, w/ Reflex to Culture (Infection Suspected) -Urine, Clean Catch     Status: Abnormal   Collection Time: 09/28/22  7:16 PM  Result Value Ref Range   Specimen Source URINE, CLEAN CATCH    Color, Urine STRAW (A) YELLOW   APPearance CLEAR CLEAR   Specific Gravity, Urine 1.003 (L) 1.005 - 1.030   pH 6.0 5.0 - 8.0   Glucose, UA NEGATIVE NEGATIVE mg/dL   Hgb urine dipstick MODERATE (A) NEGATIVE   Bilirubin Urine NEGATIVE NEGATIVE   Ketones, ur 20 (A) NEGATIVE mg/dL   Protein, ur NEGATIVE NEGATIVE mg/dL   Nitrite NEGATIVE NEGATIVE   Leukocytes,Ua NEGATIVE NEGATIVE   RBC / HPF 0-5 0 - 5 RBC/hpf    WBC, UA 0-5 0 - 5 WBC/hpf    Comment:        Reflex urine culture not performed if WBC <=10, OR if Squamous epithelial cells >5. If Squamous epithelial cells >5 suggest recollection.    Bacteria, UA RARE (A) NONE SEEN   Squamous Epithelial / HPF 0-5 0 - 5 /HPF    Comment: Performed at [facility], [address], [place], [place] [address]   CT ABDOMEN PELVIS W CONTRAST  Result Date: 09/28/2022 CLINICAL DATA:  Right lower quadrant pain EXAM: CT ABDOMEN AND PELVIS WITH CONTRAST TECHNIQUE: Multidetector CT imaging of the abdomen and pelvis was performed using the standard protocol following bolus administration of intravenous contrast. RADIATION DOSE REDUCTION: This exam was performed according to the departmental dose-optimization program which includes automated exposure control, adjustment of the mA and/or kV according to patient size and/or use of iterative reconstruction technique. CONTRAST:  75mL OMNIPAQUE IOHEXOL 350 MG/ML SOLN COMPARISON:  CT abdomen and pelvis 09/21/2019 FINDINGS: Lower chest: No acute abnormality. Hepatobiliary: No focal liver abnormality is seen. Status post cholecystectomy. No biliary dilatation. Pancreas: Unremarkable. No pancreatic ductal dilatation or surrounding inflammatory changes. Spleen: Normal in size without focal abnormality. Adrenals/Urinary Tract: Adrenal glands are unremarkable. Kidneys are normal, without renal calculi, focal lesion, or hydronephrosis. Bladder is unremarkable. Stomach/Bowel: Appendix is fluid-filled distended measuring up to 9 mm. There is surrounding inflammatory stranding. There is no free air or abscess. There is no bowel obstruction. Stomach is within normal limits. There is sigmoid colon diverticulosis. Vascular/Lymphatic: Aortic atherosclerosis. No enlarged abdominal or pelvic lymph nodes. Reproductive: Uterus and bilateral adnexa are unremarkable. Other: No abdominal wall hernia or abnormality. No abdominopelvic ascites.  Musculoskeletal: Multilevel degenerative changes affect the spine. IMPRESSION: 1. Acute appendicitis. No evidence for perforation or abscess. 2. Sigmoid colon diverticulosis. Aortic Atherosclerosis (ICD10-I70.0). Electronically Signed   By: Darliss Cheney M.D.   On: 09/28/2022 21:07   DG Abd 2 Views  Result Date: 09/28/2022 CLINICAL DATA:  Decreased bowel sounds. EXAM: ABDOMEN - 2 VIEW COMPARISON:  October 21, 2014 FINDINGS: The lung bases are normal. No free air, portal venous gas, or pneumatosis. No renal or ureteral stones are identified. There is a relative paucity of bowel gas with no dilated loops of small bowel or colon. Scoliotic curvature of the lumbar spine, apex to the right. Degenerative changes in the lumbar spine with osteophytes. No other abnormalities. IMPRESSION: 1. There is a relative paucity of bowel gas with no dilated loops of small bowel or colon. No other acute abnormalities. 2. Scoliotic curvature and degenerative changes in the lumbar spine. Electronically Signed   By: Gerome Sam III M.D.  On: 09/28/2022 14:55    Pending Labs Unresulted Labs (From admission, onward)     Start     Ordered   09/29/22 0500  Basic metabolic panel  Tomorrow morning,   R        09/28/22 2119   09/29/22 0500  CBC  Tomorrow morning,   R        09/28/22 2119   09/28/22 2117  HIV Antibody (routine testing w rflx)  (HIV Antibody (Routine testing w reflex) panel)  Once,   R        09/28/22 2119            Vitals/Pain Today's Vitals   09/28/22 1751 09/28/22 1758  BP: 139/63   Pulse: 91   Resp: 18   Temp: (!) 97.5 F (36.4 C)   TempSrc: Oral   SpO2: 96%   Weight:  58.5 kg  Height:  5' 8.5" (1.74 m)  PainSc:  9     Isolation Precautions No active isolations  Medications Medications  enoxaparin (LOVENOX) injection 40 mg (has no administration in time range)  lactated ringers infusion (has no administration in time range)  cefTRIAXone (ROCEPHIN) 2 g in sodium chloride 0.9 % 100  mL IVPB (has no administration in time range)    And  metroNIDAZOLE (FLAGYL) IVPB 500 mg (has no administration in time range)  acetaminophen (TYLENOL) tablet 1,000 mg (has no administration in time range)  docusate sodium (COLACE) capsule 100 mg (has no administration in time range)  ondansetron (ZOFRAN-ODT) disintegrating tablet 4 mg (has no administration in time range)    Or  ondansetron (ZOFRAN) injection 4 mg (has no administration in time range)  ketorolac (TORADOL) 15 MG/ML injection 30 mg (has no administration in time range)  methocarbamol (ROBAXIN) tablet 1,000 mg (has no administration in time range)  oxyCODONE (Oxy IR/ROXICODONE) immediate release tablet 5-10 mg (has no administration in time range)  morphine (PF) 2 MG/ML injection 2-4 mg (has no administration in time range)  lactated ringers bolus 1,000 mL (0 mLs Intravenous Stopped 09/28/22 2044)  iohexol (OMNIPAQUE) 350 MG/ML injection 75 mL (75 mLs Intravenous Contrast Given 09/28/22 2058)    Mobility walks     R Recommendations: See Admitting Provider Note  Report given to:   Additional Notes: A&O x4, ambulatory came from [facility] for CT scan, pt has an appendicitis, current labs are in chart from when pt was at [facility]. NAD noted. NSS. Afebrile

## 2022-09-28 NOTE — ED Notes (Signed)
Okay given by provider for Patient discharge.

## 2022-09-28 NOTE — Discharge Instructions (Addendum)
Please head to our [facility] urgent care to obtain abdominal imaging and stat labs. Once we get the results, I will contact you via phone call. For the time being, we gave you a Zofran in our office.  I would encourage you to try and eat bland dry foods like dry toast or saltine crackers, and drink fluids such as Gatorade or Pedialyte.  If you develop worsening symptoms or a fever, please head to the emergency room.  I clinically suspect your symptoms to be viral gastroenteritis which is treated with anti-emetic medication such as zofran, and BRAT diet.Marland Kitchen

## 2022-09-29 ENCOUNTER — Encounter (HOSPITAL_COMMUNITY): Payer: Self-pay

## 2022-09-29 ENCOUNTER — Observation Stay (HOSPITAL_COMMUNITY): Payer: Medicare Other | Admitting: Anesthesiology

## 2022-09-29 ENCOUNTER — Encounter (HOSPITAL_COMMUNITY): Admission: EM | Disposition: A | Discharge status: Home / Self Care | Payer: Self-pay | Source: Home / Self Care

## 2022-09-29 ENCOUNTER — Other Ambulatory Visit: Payer: Self-pay

## 2022-09-29 DIAGNOSIS — K35891 Other acute appendicitis without perforation, with gangrene: Secondary | ICD-10-CM | POA: Diagnosis present

## 2022-09-29 DIAGNOSIS — Z79890 Hormone replacement therapy (postmenopausal): Secondary | ICD-10-CM | POA: Diagnosis not present

## 2022-09-29 DIAGNOSIS — Z79899 Other long term (current) drug therapy: Secondary | ICD-10-CM | POA: Diagnosis not present

## 2022-09-29 DIAGNOSIS — F419 Anxiety disorder, unspecified: Secondary | ICD-10-CM

## 2022-09-29 DIAGNOSIS — Z86718 Personal history of other venous thrombosis and embolism: Secondary | ICD-10-CM | POA: Diagnosis not present

## 2022-09-29 DIAGNOSIS — Z87442 Personal history of urinary calculi: Secondary | ICD-10-CM | POA: Diagnosis not present

## 2022-09-29 DIAGNOSIS — I82409 Acute embolism and thrombosis of unspecified deep veins of unspecified lower extremity: Secondary | ICD-10-CM | POA: Diagnosis not present

## 2022-09-29 DIAGNOSIS — Z9049 Acquired absence of other specified parts of digestive tract: Secondary | ICD-10-CM | POA: Diagnosis not present

## 2022-09-29 DIAGNOSIS — Z7982 Long term (current) use of aspirin: Secondary | ICD-10-CM | POA: Diagnosis not present

## 2022-09-29 DIAGNOSIS — K3531 Acute appendicitis with localized peritonitis and gangrene, without perforation: Secondary | ICD-10-CM | POA: Diagnosis present

## 2022-09-29 DIAGNOSIS — K37 Unspecified appendicitis: Secondary | ICD-10-CM

## 2022-09-29 DIAGNOSIS — R3915 Urgency of urination: Secondary | ICD-10-CM | POA: Diagnosis present

## 2022-09-29 HISTORY — PX: LAPAROSCOPIC APPENDECTOMY: SHX408

## 2022-09-29 LAB — SURGICAL PCR SCREEN
MRSA, PCR: NEGATIVE
Staphylococcus aureus: NEGATIVE

## 2022-09-29 LAB — CBC
HCT: 39 % (ref 36.0–46.0)
Hemoglobin: 13.3 g/dL (ref 12.0–15.0)
MCH: 31.9 pg (ref 26.0–34.0)
MCHC: 34.1 g/dL (ref 30.0–36.0)
MCV: 93.5 fL (ref 80.0–100.0)
Platelets: 167 10*3/uL (ref 150–400)
RBC: 4.17 MIL/uL (ref 3.87–5.11)
RDW: 12.6 % (ref 11.5–15.5)
WBC: 14.5 10*3/uL — ABNORMAL HIGH (ref 4.0–10.5)
nRBC: 0 % (ref 0.0–0.2)

## 2022-09-29 LAB — BASIC METABOLIC PANEL
Anion gap: 9 (ref 5–15)
BUN: 9 mg/dL (ref 8–23)
CO2: 27 mmol/L (ref 22–32)
Calcium: 8.7 mg/dL — ABNORMAL LOW (ref 8.9–10.3)
Chloride: 100 mmol/L (ref 98–111)
Creatinine, Ser: 0.57 mg/dL (ref 0.44–1.00)
GFR, Estimated: >60 mL/min (ref >60)
Glucose, Bld: 112 mg/dL — ABNORMAL HIGH (ref 70–99)
Potassium: 3.6 mmol/L (ref 3.5–5.1)
Sodium: 136 mmol/L (ref 135–145)

## 2022-09-29 LAB — HIV ANTIBODY (ROUTINE TESTING W REFLEX): HIV Screen 4th Generation wRfx: NONREACTIVE

## 2022-09-29 SURGERY — APPENDECTOMY, LAPAROSCOPIC
Anesthesia: General

## 2022-09-29 MED ORDER — BUPIVACAINE HCL (PF) 0.25 % IJ SOLN
INTRAMUSCULAR | Status: DC | PRN
  Administered 2022-09-29: 18 mL

## 2022-09-29 MED ORDER — ORAL CARE MOUTH RINSE: 15.0000 mL | Freq: Once | OROMUCOSAL | Status: AC

## 2022-09-29 MED ORDER — LIDOCAINE 2% (20 MG/ML) 5 ML SYRINGE
INTRAMUSCULAR | Status: DC | PRN
  Administered 2022-09-29: 60 mg via INTRAVENOUS

## 2022-09-29 MED ORDER — ROCURONIUM BROMIDE 10 MG/ML (PF) SYRINGE
PREFILLED_SYRINGE | INTRAVENOUS | Status: DC | PRN
  Administered 2022-09-29 (×2): 10 mg via INTRAVENOUS
  Administered 2022-09-29: 40 mg via INTRAVENOUS

## 2022-09-29 MED ORDER — PROPOFOL 10 MG/ML IV BOLUS
INTRAVENOUS | Status: AC
  Filled 2022-09-29: qty 20

## 2022-09-29 MED ORDER — FENTANYL CITRATE (PF) 100 MCG/2ML IJ SOLN
25.0000 ug | INTRAMUSCULAR | Status: DC | PRN
Start: 2022-09-29 — End: 2022-09-29

## 2022-09-29 MED ORDER — ONDANSETRON HCL 4 MG/2ML IJ SOLN
4.0000 mg | Freq: Once | INTRAMUSCULAR | Status: DC | PRN
Start: 2022-09-29 — End: 2022-09-29

## 2022-09-29 MED ORDER — 0.9 % SODIUM CHLORIDE (POUR BTL) OPTIME
TOPICAL | Status: DC | PRN
  Administered 2022-09-29: 1000 mL

## 2022-09-29 MED ORDER — BUPIVACAINE HCL (PF) 0.25 % IJ SOLN
INTRAMUSCULAR | Status: AC
  Filled 2022-09-29: qty 30

## 2022-09-29 MED ORDER — SUGAMMADEX SODIUM 200 MG/2ML IV SOLN
INTRAVENOUS | Status: DC | PRN
  Administered 2022-09-29: 150 mg via INTRAVENOUS

## 2022-09-29 MED ORDER — DEXAMETHASONE SODIUM PHOSPHATE 10 MG/ML IJ SOLN
INTRAMUSCULAR | Status: DC | PRN
  Administered 2022-09-29: 4 mg via INTRAVENOUS

## 2022-09-29 MED ORDER — PHENYLEPHRINE 80 MCG/ML (10ML) SYRINGE FOR IV PUSH (FOR BLOOD PRESSURE SUPPORT)
PREFILLED_SYRINGE | INTRAVENOUS | Status: AC
  Filled 2022-09-29: qty 10

## 2022-09-29 MED ORDER — CHLORHEXIDINE GLUCONATE 0.12 % MT SOLN: 15.0000 mL | Freq: Once | OROMUCOSAL | Status: AC

## 2022-09-29 MED ORDER — LACTATED RINGERS IV SOLN: INTRAVENOUS | Status: DC

## 2022-09-29 MED ORDER — ONDANSETRON HCL 4 MG/2ML IJ SOLN
INTRAMUSCULAR | Status: DC | PRN
  Administered 2022-09-29: 4 mg via INTRAVENOUS

## 2022-09-29 MED ORDER — SODIUM CHLORIDE 0.9 % IR SOLN
Status: DC | PRN
  Administered 2022-09-29: 500 mL

## 2022-09-29 MED ORDER — OXYCODONE HCL 5 MG PO TABS
5.0000 mg | ORAL_TABLET | Freq: Four times a day (QID) | ORAL | Status: DC | PRN
  Filled 2022-09-29: qty 1

## 2022-09-29 MED ORDER — FENTANYL CITRATE (PF) 250 MCG/5ML IJ SOLN
INTRAMUSCULAR | Status: DC | PRN
  Administered 2022-09-29 (×2): 50 ug via INTRAVENOUS

## 2022-09-29 MED ORDER — PROPOFOL 10 MG/ML IV BOLUS
INTRAVENOUS | Status: DC | PRN
  Administered 2022-09-29: 120 mg via INTRAVENOUS

## 2022-09-29 MED ORDER — DEXAMETHASONE SODIUM PHOSPHATE 10 MG/ML IJ SOLN
INTRAMUSCULAR | Status: AC
  Filled 2022-09-29: qty 1

## 2022-09-29 MED ORDER — ONDANSETRON HCL 4 MG/2ML IJ SOLN
INTRAMUSCULAR | Status: AC
  Filled 2022-09-29: qty 2

## 2022-09-29 MED ORDER — CHLORHEXIDINE GLUCONATE 0.12 % MT SOLN
OROMUCOSAL | Status: AC
  Administered 2022-09-29: 15 mL via OROMUCOSAL
  Filled 2022-09-29: qty 15

## 2022-09-29 MED ORDER — ROCURONIUM BROMIDE 10 MG/ML (PF) SYRINGE
PREFILLED_SYRINGE | INTRAVENOUS | Status: AC
  Filled 2022-09-29: qty 10

## 2022-09-29 MED ORDER — ACETAMINOPHEN 10 MG/ML IV SOLN
INTRAVENOUS | Status: DC | PRN
  Administered 2022-09-29: 1000 mg via INTRAVENOUS

## 2022-09-29 MED ORDER — MIDAZOLAM HCL 2 MG/2ML IJ SOLN
INTRAMUSCULAR | Status: DC | PRN
  Administered 2022-09-29: 2 mg via INTRAVENOUS

## 2022-09-29 MED ORDER — MIDAZOLAM HCL 2 MG/2ML IJ SOLN
INTRAMUSCULAR | Status: AC
  Filled 2022-09-29: qty 2

## 2022-09-29 MED ORDER — PHENYLEPHRINE 80 MCG/ML (10ML) SYRINGE FOR IV PUSH (FOR BLOOD PRESSURE SUPPORT)
PREFILLED_SYRINGE | INTRAVENOUS | Status: DC | PRN
  Administered 2022-09-29 (×2): 80 ug via INTRAVENOUS

## 2022-09-29 MED ORDER — ACETAMINOPHEN 10 MG/ML IV SOLN
INTRAVENOUS | Status: AC
  Filled 2022-09-29: qty 100

## 2022-09-29 MED ORDER — FENTANYL CITRATE (PF) 250 MCG/5ML IJ SOLN
INTRAMUSCULAR | Status: AC
  Filled 2022-09-29: qty 5

## 2022-09-29 MED ORDER — LIDOCAINE 2% (20 MG/ML) 5 ML SYRINGE
INTRAMUSCULAR | Status: AC
  Filled 2022-09-29: qty 5

## 2022-09-29 MED ORDER — KETOROLAC TROMETHAMINE 15 MG/ML IJ SOLN
15.0000 mg | Freq: Four times a day (QID) | INTRAMUSCULAR | Status: DC | PRN
  Administered 2022-09-29 – 2022-09-30 (×2): 15 mg via INTRAVENOUS
  Filled 2022-09-29 (×2): qty 1

## 2022-09-29 MED ORDER — HYDROMORPHONE HCL 1 MG/ML IJ SOLN
0.5000 mg | INTRAMUSCULAR | Status: DC | PRN
  Administered 2022-09-29: 0.5 mg via INTRAVENOUS
  Filled 2022-09-29: qty 0.5

## 2022-09-29 SURGICAL SUPPLY — 36 items
BAG COUNTER SPONGE SURGICOUNT (BAG) ×1 IMPLANT
CANISTER SUCT 3000ML PPV (MISCELLANEOUS) ×1 IMPLANT
CHLORAPREP W/TINT 26 (MISCELLANEOUS) ×1 IMPLANT
COVER SURGICAL LIGHT HANDLE (MISCELLANEOUS) ×1 IMPLANT
CUTTER FLEX LINEAR 45M (STAPLE) ×1 IMPLANT
DERMABOND ADVANCED .7 DNX12 (GAUZE/BANDAGES/DRESSINGS) ×1 IMPLANT
ELECT REM PT RETURN 9FT ADLT (ELECTROSURGICAL) ×1
ELECTRODE REM PT RTRN 9FT ADLT (ELECTROSURGICAL) ×1 IMPLANT
GLOVE BIO SURGEON STRL SZ 6 (GLOVE) ×1 IMPLANT
GLOVE INDICATOR 6.5 STRL GRN (GLOVE) ×1 IMPLANT
GOWN STRL REUS W/ TWL LRG LVL3 (GOWN DISPOSABLE) ×3 IMPLANT
GOWN STRL REUS W/TWL LRG LVL3 (GOWN DISPOSABLE) ×2
GRASPER SUT TROCAR 14GX15 (MISCELLANEOUS) ×1 IMPLANT
IRRIG SUCT STRYKERFLOW 2 WTIP (MISCELLANEOUS) ×1
IRRIGATION SUCT STRKRFLW 2 WTP (MISCELLANEOUS) ×1 IMPLANT
KIT BASIN OR (CUSTOM PROCEDURE TRAY) ×1 IMPLANT
KIT TURNOVER KIT B (KITS) ×1 IMPLANT
NDL INSUFFLATION 14GA 120MM (NEEDLE) ×1 IMPLANT
NEEDLE INSUFFLATION 14GA 120MM (NEEDLE) ×1 IMPLANT
NS IRRIG 1000ML POUR BTL (IV SOLUTION) ×1 IMPLANT
PAD ARMBOARD 7.5X6 YLW CONV (MISCELLANEOUS) ×2 IMPLANT
RELOAD STAPLE 45 3.5 BLU ETS (ENDOMECHANICALS) IMPLANT
RELOAD STAPLE TA45 3.5 REG BLU (ENDOMECHANICALS) ×1 IMPLANT
SCISSORS LAP 5X35 DISP (ENDOMECHANICALS) IMPLANT
SET TUBE SMOKE EVAC HIGH FLOW (TUBING) ×1 IMPLANT
SHEARS HARMONIC ACE PLUS 36CM (ENDOMECHANICALS) ×1 IMPLANT
SLEEVE Z-THREAD 5X100MM (TROCAR) ×1 IMPLANT
SPECIMEN JAR SMALL (MISCELLANEOUS) ×1 IMPLANT
SUT MNCRL AB 4-0 PS2 18 (SUTURE) ×1 IMPLANT
SYS BAG RETRIEVAL 10MM (BASKET) ×1
SYSTEM BAG RETRIEVAL 10MM (BASKET) ×1 IMPLANT
TRAY LAPAROSCOPIC MC (CUSTOM PROCEDURE TRAY) ×1 IMPLANT
TROCAR Z THREAD OPTICAL 12X100 (TROCAR) ×1 IMPLANT
TROCAR Z-THREAD OPTICAL 5X100M (TROCAR) ×1 IMPLANT
WARMER LAPAROSCOPE (MISCELLANEOUS) ×1 IMPLANT
WATER STERILE IRR 1000ML POUR (IV SOLUTION) ×1 IMPLANT

## 2022-09-29 NOTE — Transfer of Care (Signed)
Immediate Anesthesia Transfer of Care Note  Patient: Linda Richard  Procedure(s) Performed: APPENDECTOMY LAPAROSCOPIC  Patient Location: PACU  Anesthesia Type:General  Level of Consciousness: awake, alert , and oriented  Airway & Oxygen Therapy: Patient Spontanous Breathing  Post-op Assessment: Report given to RN and Post -op Vital signs reviewed and stable  Post vital signs: Reviewed and stable  Last Vitals:  Vitals Value Taken Time  BP 117/49 09/29/22 0945  Temp    Pulse 86 09/29/22 0946  Resp 15 09/29/22 0946  SpO2 96 % 09/29/22 0946  Vitals shown include unvalidated device data.  Last Pain:  Vitals:   09/29/22 0807  TempSrc: Oral  PainSc:          Complications: No notable events documented.

## 2022-09-29 NOTE — Plan of Care (Signed)

## 2022-09-29 NOTE — Op Note (Signed)
Operative Note  Linda Richard  [id number]  [id number]  09/29/2022   Surgeon: Phylliss Blakes MD FACS   Procedure performed: Laparoscopic appendectomy   Preop diagnosis: Acute appendicitis Post-op diagnosis/intraop findings: Acute gangrenous appendicitis without perforation with localized purulent peritonitis extending onto multiple loops of adjacent small bowel and abdominal wall   Specimens: Appendix Retained items: No EBL: Minimal cc Complications: none   Description of procedure: After obtaining informed consent the patient was taken to the operating room and placed supine on operating room table where general endotracheal anesthesia was initiated, preoperative antibiotics were administered, SCDs applied, and a formal timeout was performed.  The abdomen was prepped and draped in the usual sterile fashion.  Given patient's history of a prior low midline incision, peritoneal access was attempted with a left subcostal Veress needle followed by optical entry in the left upper quadrant with a 5 mm Optiview trocar.  There was a small rent in the capsule of the left lobe of the liver; her liver is somewhat enlarged and extends well below the costal margin on the left side.  The liver was inspected on the undersurface and there was not a through and through injury and no injury to the underlying stomach or bowel.  Hemostasis on the liver capsule was achieved with direct pressure using a Ray-Tec which was removed at the end of the case.  The abdominal cavity was surveyed and there were no adhesions to the anterior abdominal wall with the exception of the cecum which was tethered to the right lower quadrant with inflammatory adhesions.  Under direct visualization, and avoiding the very large collateral subcutaneous veins which were easily visible with transillumination, a low midline and left lower quadrant trocar were placed after infiltration with local.  Few loops of mid small bowel were adherent to  the appendix with purulent rind in inflammatory adhesions.  These were gently bluntly separated.  The small bowel was inspected and confirmed to be free of injury.  The appendix itself was also adherent to the lower right abdominal wall with pus and inflammatory adhesions.  This was carefully gently peeled away.  The appendix did have gangrenous changes extending almost to the base but no evidence of perforation.  Small amount of pus surrounding the appendix was evacuated with the suction irrigator.  The lateral attachments of the cecum were carefully divided with the harmonic scalpel, mobilizing the cecum and terminal ileum slightly so that we could clearly identify the base of the appendix.  The mesoappendix was then divided with the harmonic scalpel.  Hemostasis was excellent.  The appendix was transected from the cecum with a blue load 45 mm Endo GIA stapler, placed in a Endo Catch bag and removed through the left lower quadrant 12 mm trocar site.  On completion the staple line along the cecum appears intact, hemostatic and viable.  The right lower quadrant and pelvis were sparingly irrigated; the effluent was clear.  Omentum was brought down over the staple line.  The Ray-Tec that have been placed over the liver capsule was removed and this area was confirmed to be hemostatic.  The 12 mm trocar site in the left lower quadrant was closed with 2 simple interrupted 0 Vicryl's using laparoscopic suture passer under direct visualization.  Additional local was infiltrated in each incision.  The abdomen was then desufflated and the trocars removed.  Skin incisions were closed with subcuticular 4 Monocryl and Dermabond.  The patient was then awakened, extubated and taken to PACU in  stable condition.    All counts were correct at the completion of the case.

## 2022-09-29 NOTE — Interval H&P Note (Signed)
History and Physical Interval Note:  09/29/2022 7:47 AM  Linda Richard  has presented today for surgery, with the diagnosis of appy.  The various methods of treatment have been discussed with the patient and family. After consideration of risks, benefits and other options for treatment, the patient has consented to  Procedure(s): APPENDECTOMY LAPAROSCOPIC (N/A) as a surgical intervention.  The patient's history has been reviewed, patient examined, no change in status, stable for surgery.  I have reviewed the patient's chart and labs.  Questions were answered to the patient's satisfaction.     Ailani Governale Lollie Sails

## 2022-09-29 NOTE — Plan of Care (Signed)
  Problem: Education: Goal: Knowledge of General Education information will improve Description: Including pain rating scale, medication(s)/side effects and non-pharmacologic comfort measures 09/29/2022 1240 by Doyce Para, RN Outcome: Progressing 09/29/2022 1240 by Doyce Para, RN Outcome: Progressing   Problem: Health Behavior/Discharge Planning: Goal: Ability to manage health-related needs will improve 09/29/2022 1240 by Doyce Para, RN Outcome: Progressing 09/29/2022 1240 by Doyce Para, RN Outcome: Progressing   Problem: Clinical Measurements: Goal: Ability to maintain clinical measurements within normal limits will improve 09/29/2022 1240 by Doyce Para, RN Outcome: Progressing 09/29/2022 1240 by Doyce Para, RN Outcome: Progressing Goal: Will remain free from infection 09/29/2022 1240 by Doyce Para, RN Outcome: Progressing 09/29/2022 1240 by Doyce Para, RN Outcome: Progressing Goal: Diagnostic test results will improve 09/29/2022 1240 by Doyce Para, RN Outcome: Progressing 09/29/2022 1240 by Doyce Para, RN Outcome: Progressing Goal: Respiratory complications will improve 09/29/2022 1240 by Doyce Para, RN Outcome: Progressing 09/29/2022 1240 by Doyce Para, RN Outcome: Progressing Goal: Cardiovascular complication will be avoided 09/29/2022 1240 by Doyce Para, RN Outcome: Progressing 09/29/2022 1240 by Doyce Para, RN Outcome: Progressing   Problem: Activity: Goal: Risk for activity intolerance will decrease 09/29/2022 1240 by Doyce Para, RN Outcome: Progressing 09/29/2022 1240 by Doyce Para, RN Outcome: Progressing   Problem: Nutrition: Goal: Adequate nutrition will be maintained 09/29/2022 1240 by Doyce Para, RN Outcome: Progressing 09/29/2022 1240 by Doyce Para, RN Outcome: Progressing   Problem: Coping: Goal: Level  of anxiety will decrease 09/29/2022 1240 by Doyce Para, RN Outcome: Progressing 09/29/2022 1240 by Doyce Para, RN Outcome: Progressing   Problem: Elimination: Goal: Will not experience complications related to bowel motility 09/29/2022 1240 by Doyce Para, RN Outcome: Progressing 09/29/2022 1240 by Doyce Para, RN Outcome: Progressing Goal: Will not experience complications related to urinary retention 09/29/2022 1240 by Doyce Para, RN Outcome: Progressing 09/29/2022 1240 by Doyce Para, RN Outcome: Progressing   Problem: Pain Managment: Goal: General experience of comfort will improve 09/29/2022 1240 by Doyce Para, RN Outcome: Progressing 09/29/2022 1240 by Doyce Para, RN Outcome: Progressing   Problem: Safety: Goal: Ability to remain free from injury will improve 09/29/2022 1240 by Doyce Para, RN Outcome: Progressing 09/29/2022 1240 by Doyce Para, RN Outcome: Progressing   Problem: Skin Integrity: Goal: Risk for impaired skin integrity will decrease 09/29/2022 1240 by Doyce Para, RN Outcome: Progressing 09/29/2022 1240 by Doyce Para, RN Outcome: Progressing

## 2022-09-29 NOTE — Anesthesia Procedure Notes (Signed)
Procedure Name: Intubation Date/Time: 09/29/2022 8:23 AM  Performed by: Garfield Cornea, CRNAPre-anesthesia Checklist: Patient identified, Emergency Drugs available, Suction available and Patient being monitored Patient Re-evaluated:Patient Re-evaluated prior to induction Oxygen Delivery Method: Circle System Utilized Preoxygenation: Pre-oxygenation with 100% oxygen Induction Type: IV induction Ventilation: Mask ventilation without difficulty Laryngoscope Size: Mac and 3 Grade View: Grade II Tube type: Oral Tube size: 7.0 mm Number of attempts: 1 Airway Equipment and Method: Stylet Placement Confirmation: ETT inserted through vocal cords under direct vision, positive ETCO2 and breath sounds checked- equal and bilateral Secured at: 22 cm Tube secured with: Tape Dental Injury: Teeth and Oropharynx as per pre-operative assessment

## 2022-09-29 NOTE — Anesthesia Preprocedure Evaluation (Addendum)
Anesthesia Evaluation  Patient identified by MRN, date of birth, ID band Patient awake    Reviewed: Allergy & Precautions, NPO status , Patient's Chart, lab work & pertinent test results  Airway Mallampati: I  TM Distance: >3 FB Neck ROM: Full    Dental  (+) Teeth Intact, Dental Advisory Given   Pulmonary neg pulmonary ROS   Pulmonary exam normal breath sounds clear to auscultation       Cardiovascular + DVT  Normal cardiovascular exam Rhythm:Regular Rate:Normal     Neuro/Psych  PSYCHIATRIC DISORDERS Anxiety     negative neurological ROS     GI/Hepatic Neg liver ROS,,,Appendicitis    Endo/Other  negative endocrine ROS    Renal/GU negative Renal ROS     Musculoskeletal negative musculoskeletal ROS (+)    Abdominal   Peds  Hematology negative hematology ROS (+)   Anesthesia Other Findings Day of surgery medications reviewed with the patient.  Reproductive/Obstetrics                              Anesthesia Physical Anesthesia Plan  ASA: 2  Anesthesia Plan: General   Post-op Pain Management: Ofirmev IV (intra-op)*   Induction: Intravenous  PONV Risk Score and Plan: 3 and Dexamethasone and Ondansetron  Airway Management Planned: Oral ETT  Additional Equipment:   Intra-op Plan:   Post-operative Plan: Extubation in OR  Informed Consent: I have reviewed the patients History and Physical, chart, labs and discussed the procedure including the risks, benefits and alternatives for the proposed anesthesia with the patient or authorized representative who has indicated his/her understanding and acceptance.     Dental advisory given  Plan Discussed with: CRNA  Anesthesia Plan Comments:         Anesthesia Quick Evaluation

## 2022-09-30 ENCOUNTER — Other Ambulatory Visit (HOSPITAL_COMMUNITY): Payer: Self-pay

## 2022-09-30 ENCOUNTER — Encounter (HOSPITAL_COMMUNITY): Payer: Self-pay | Admitting: Surgery

## 2022-09-30 LAB — BASIC METABOLIC PANEL
Anion gap: 9 (ref 5–15)
BUN: 7 mg/dL — ABNORMAL LOW (ref 8–23)
CO2: 25 mmol/L (ref 22–32)
Calcium: 8.3 mg/dL — ABNORMAL LOW (ref 8.9–10.3)
Chloride: 101 mmol/L (ref 98–111)
Creatinine, Ser: 0.45 mg/dL (ref 0.44–1.00)
GFR, Estimated: >60 mL/min (ref >60)
Glucose, Bld: 117 mg/dL — ABNORMAL HIGH (ref 70–99)
Potassium: 3.3 mmol/L — ABNORMAL LOW (ref 3.5–5.1)
Sodium: 135 mmol/L (ref 135–145)

## 2022-09-30 LAB — CBC
HCT: 35.4 % — ABNORMAL LOW (ref 36.0–46.0)
Hemoglobin: 11.9 g/dL — ABNORMAL LOW (ref 12.0–15.0)
MCH: 31.4 pg (ref 26.0–34.0)
MCHC: 33.6 g/dL (ref 30.0–36.0)
MCV: 93.4 fL (ref 80.0–100.0)
Platelets: 175 10*3/uL (ref 150–400)
RBC: 3.79 MIL/uL — ABNORMAL LOW (ref 3.87–5.11)
RDW: 12.3 % (ref 11.5–15.5)
WBC: 10.4 10*3/uL (ref 4.0–10.5)
nRBC: 0 % (ref 0.0–0.2)

## 2022-09-30 LAB — MAGNESIUM: Magnesium: 1.8 mg/dL (ref 1.7–2.4)

## 2022-09-30 MED ORDER — TRAMADOL HCL 50 MG PO TABS
50.0000 mg | ORAL_TABLET | Freq: Four times a day (QID) | ORAL | 0 refills | Status: DC | PRN
  Filled 2022-09-30: qty 15, 4d supply, fill #0

## 2022-09-30 MED ORDER — ACETAMINOPHEN 500 MG PO TABS: 1000.0000 mg | ORAL_TABLET | Freq: Three times a day (TID) | ORAL | 0 refills | Status: AC | PRN

## 2022-09-30 MED ORDER — METHOCARBAMOL 500 MG PO TABS
500.0000 mg | ORAL_TABLET | Freq: Four times a day (QID) | ORAL | 0 refills | Status: DC | PRN
  Filled 2022-09-30: qty 30, 8d supply, fill #0

## 2022-09-30 MED ORDER — TRAMADOL HCL 50 MG PO TABS: 50.0000 mg | ORAL_TABLET | Freq: Four times a day (QID) | ORAL | Status: DC | PRN

## 2022-09-30 MED ORDER — POTASSIUM CHLORIDE 20 MEQ PO PACK
60.0000 meq | PACK | Freq: Once | ORAL | Status: AC
  Administered 2022-09-30: 60 meq via ORAL
  Filled 2022-09-30: qty 3

## 2022-09-30 NOTE — TOC Initial Note (Signed)
Transition of Care [facility]) - Initial/Assessment Note    Patient Details  Name: Linda Richard MRN: [id number] Date of Birth: 02/06/1951  Transition of [facility]) CM/SW Contact:    Yovanni Frenette A [place], Theresia Majors Phone Number: 09/30/2022, 10:18 AM  Clinical Narrative:                  TOC met with pt at bedside. She said that she is very active, "I walk 6 to 8 miles a day "at home and has support from her husband. She said that she is ready to go home and start walking again. She said that she has a walk-in sit down shower and a bedroom on the lower level of her home. No equipment requested or recommended.   No TOC needs identified.   TOC will sign off. TOC can be consulted if any other needs arise.    Expected Discharge Plan: Home/Self Care Barriers to Discharge: No Barriers Identified   Patient Goals and CMS Choice Patient states their goals for this hospitalization and ongoing recovery are:: go home          Expected Discharge Plan and Services       Living arrangements for the past 2 months: Single Family Home                                      Prior Living Arrangements/Services Living arrangements for the past 2 months: Single Family Home Lives with:: Domestic Partner          Need for Family Participation in Patient Care: No (Comment) Care giver support system in place?: Yes (comment)   Criminal Activity/Legal Involvement Pertinent to Current Situation/Hospitalization: No - Comment as needed  Activities of Daily Living      Permission Sought/Granted                  Emotional Assessment Appearance:: Appears younger than stated age Attitude/Demeanor/Rapport: Engaged Affect (typically observed): Pleasant Orientation: : Oriented to Self, Oriented to Place, Oriented to  Time, Oriented to Situation Alcohol / Substance Use: Not Applicable Psych Involvement: No (comment)  Admission diagnosis:  Acute appendicitis [K35.80] Patient Active Problem List    Diagnosis Date Noted   Acute appendicitis 09/28/2022   Hyperlipidemia 08/06/2017   Osteopenia 08/06/2017   Renal calculus or stone 10/18/2014   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   Visit for preventive health examination 03/02/2013   History of renal stone 02/26/2012   Microscopic hematuria 02/26/2012   Preventative health care 02/04/2011   DVT (deep vein thrombosis) in pregnancy    Varicose veins    VENOUS INSUFFICIENCY, CHRONIC 12/01/2007   DEEP VENOUS THROMBOPHLEBITIS, LEFT, LEG, HX OF 12/01/2007   PCP:  Madelin Headings, MD Pharmacy:   [facility] - [place], [place] - [address] [place]e Round [place] [address] Phone: [phone] Fax: [phone]     Social Determinants of Health (SDOH) Social History: SDOH Screenings   Food Insecurity: No Food Insecurity (07/03/2022)  Housing: Low Risk  (07/03/2022)  Transportation Needs: No Transportation Needs (07/03/2022)  Alcohol Screen: Low Risk  (07/03/2022)  Depression (PHQ2-9): Low Risk  (12/19/2021)  Financial Resource Strain: Low Risk  (07/03/2022)  Physical Activity: Sufficiently Active (07/03/2022)  Social Connections: Unknown (07/03/2022)  Stress: No Stress Concern Present (07/03/2022)  Tobacco Use: Low  Risk  (09/30/2022)   SDOH Interventions:     Readmission Risk Interventions     No data to display

## 2022-09-30 NOTE — Anesthesia Postprocedure Evaluation (Signed)
Anesthesia Post Note  Patient: Linda Richard  Procedure(s) Performed: APPENDECTOMY LAPAROSCOPIC     Patient location during evaluation: PACU Anesthesia Type: General Level of consciousness: awake and alert Pain management: pain level controlled Vital Signs Assessment: post-procedure vital signs reviewed and stable Respiratory status: spontaneous breathing, nonlabored ventilation, respiratory function stable and patient connected to nasal cannula oxygen Cardiovascular status: blood pressure returned to baseline and stable Postop Assessment: no apparent nausea or vomiting Anesthetic complications: no   No notable events documented.  Last Vitals:  Vitals:   09/30/22 0402 09/30/22 0903  BP: (!) 105/46 (!) 118/58  Pulse: 70 69  Resp: 18 18  Temp: 36.9 C (!) 36.4 C  SpO2: 96% 99%    Last Pain:  Vitals:   09/30/22 0903  TempSrc: Oral  PainSc:                  Collene Schlichter

## 2022-09-30 NOTE — Progress Notes (Addendum)
1 Day Post-Op  Subjective: CC: Doing well. Feeling much better since surgery. Just having soreness around her incisions. Wants to try something less strong than oxy. Agreeable to try Ultram (denies hx seizures). Tolerating cld without n/v. Passing flatus. BM this am. Voiding. Mobilizing in the room.   Objective: Vital signs in last 24 hours: Temp:  [97.5 F (36.4 C)-99 F (37.2 C)] 97.5 F (36.4 C) (05/13 0903) Pulse Rate:  [69-80] 69 (05/13 0903) Resp:  [17-18] 18 (05/13 0903) BP: (105-125)/(46-58) 118/58 (05/13 0903) SpO2:  [96 %-99 %] 99 % (05/13 0903) Last BM Date : 09/27/22  Intake/Output from previous day: 05/12 0701 - 05/13 0700 In: 600 [I.V.:600] Out: 5 [Blood:5] Intake/Output this shift: No intake/output data recorded.  PE: Gen:  Alert, NAD, pleasant Card:  RRR Pulm:  CTAB, no W/R/R, effort normal Abd: Soft, no distension, appropriately tender around laparoscopic incisions, no rigidity or guarding and otherwise NT, +BS. Incisions with glue intact appears well and are without drainage, bleeding, or signs of infection  Ext:  No LE edema  Psych: A&Ox3   Lab Results:  Recent Labs    09/29/22 0434 09/30/22 0443  WBC 14.5* 10.4  HGB 13.3 11.9*  HCT 39.0 35.4*  PLT 167 175   BMET Recent Labs    09/29/22 0434 09/30/22 0443  NA 136 135  K 3.6 3.3*  CL 100 101  CO2 27 25  GLUCOSE 112* 117*  BUN 9 7*  CREATININE 0.57 0.45  CALCIUM 8.7* 8.3*   PT/INR No results for input(s): "LABPROT", "INR" in the last 72 hours. CMP     Component Value Date/Time   NA 135 09/30/2022 0443   K 3.3 (L) 09/30/2022 0443   CL 101 09/30/2022 0443   CO2 25 09/30/2022 0443   GLUCOSE 117 (H) 09/30/2022 0443   BUN 7 (L) 09/30/2022 0443   CREATININE 0.45 09/30/2022 0443   CALCIUM 8.3 (L) 09/30/2022 0443   CALCIUM 10.5 02/26/2012 1151   PROT 7.8 09/28/2022 1349   ALBUMIN 4.0 09/28/2022 1349   AST 54 (H) 09/28/2022 1349   ALT 43 09/28/2022 1349   ALKPHOS 68 09/28/2022  1349   BILITOT 1.5 (H) 09/28/2022 1349   GFRNONAA >60 09/30/2022 0443   GFRAA >60 01/20/2015 0926   Lipase     Component Value Date/Time   LIPASE 37 09/28/2022 1349    Studies/Results: CT ABDOMEN PELVIS W CONTRAST  Result Date: 09/28/2022 CLINICAL DATA:  Right lower quadrant pain EXAM: CT ABDOMEN AND PELVIS WITH CONTRAST TECHNIQUE: Multidetector CT imaging of the abdomen and pelvis was performed using the standard protocol following bolus administration of intravenous contrast. RADIATION DOSE REDUCTION: This exam was performed according to the departmental dose-optimization program which includes automated exposure control, adjustment of the mA and/or kV according to patient size and/or use of iterative reconstruction technique. CONTRAST:  75mL OMNIPAQUE IOHEXOL 350 MG/ML SOLN COMPARISON:  CT abdomen and pelvis 09/21/2019 FINDINGS: Lower chest: No acute abnormality. Hepatobiliary: No focal liver abnormality is seen. Status post cholecystectomy. No biliary dilatation. Pancreas: Unremarkable. No pancreatic ductal dilatation or surrounding inflammatory changes. Spleen: Normal in size without focal abnormality. Adrenals/Urinary Tract: Adrenal glands are unremarkable. Kidneys are normal, without renal calculi, focal lesion, or hydronephrosis. Bladder is unremarkable. Stomach/Bowel: Appendix is fluid-filled distended measuring up to 9 mm. There is surrounding inflammatory stranding. There is no free air or abscess. There is no bowel obstruction. Stomach is within normal limits. There is sigmoid colon diverticulosis. Vascular/Lymphatic: Aortic atherosclerosis.  No enlarged abdominal or pelvic lymph nodes. Reproductive: Uterus and bilateral adnexa are unremarkable. Other: No abdominal wall hernia or abnormality. No abdominopelvic ascites. Musculoskeletal: Multilevel degenerative changes affect the spine. IMPRESSION: 1. Acute appendicitis. No evidence for perforation or abscess. 2. Sigmoid colon diverticulosis.  Aortic Atherosclerosis (ICD10-I70.0). Electronically Signed   By: Darliss Cheney M.D.   On: 09/28/2022 21:07   DG Abd 2 Views  Result Date: 09/28/2022 CLINICAL DATA:  Decreased bowel sounds. EXAM: ABDOMEN - 2 VIEW COMPARISON:  October 21, 2014 FINDINGS: The lung bases are normal. No free air, portal venous gas, or pneumatosis. No renal or ureteral stones are identified. There is a relative paucity of bowel gas with no dilated loops of small bowel or colon. Scoliotic curvature of the lumbar spine, apex to the right. Degenerative changes in the lumbar spine with osteophytes. No other abnormalities. IMPRESSION: 1. There is a relative paucity of bowel gas with no dilated loops of small bowel or colon. No other acute abnormalities. 2. Scoliotic curvature and degenerative changes in the lumbar spine. Electronically Signed   By: Gerome Sam III M.D.   On: 09/28/2022 14:55    Anti-infectives: Anti-infectives (From admission, onward)    Start     Dose/Rate Route Frequency Ordered Stop   09/29/22 0500  piperacillin-tazobactam (ZOSYN) IVPB 3.375 g  Status:  Discontinued       See Hyperspace for full Linked Orders Report.   3.375 g 12.5 mL/hr over 240 Minutes Intravenous Every 8 hours 09/28/22 2117 09/28/22 2119   09/28/22 2130  piperacillin-tazobactam (ZOSYN) IVPB 3.375 g  Status:  Discontinued       See Hyperspace for full Linked Orders Report.   3.375 g 100 mL/hr over 30 Minutes Intravenous  Once 09/28/22 2117 09/28/22 2119   09/28/22 2130  cefTRIAXone (ROCEPHIN) 2 g in sodium chloride 0.9 % 100 mL IVPB       See Hyperspace for full Linked Orders Report.   2 g 200 mL/hr over 30 Minutes Intravenous Every 24 hours 09/28/22 2119 10/05/22 2129   09/28/22 2130  metroNIDAZOLE (FLAGYL) IVPB 500 mg       See Hyperspace for full Linked Orders Report.   500 mg 100 mL/hr over 60 Minutes Intravenous Every 12 hours 09/28/22 2119 10/05/22 2129        Assessment/Plan POD 1 s/p laparoscopic appendectomy for  acute gangrenous appendicitis without perforation with localized purulent peritonitis extending onto multiple loops of adjacent small bowel and abdominal wall by Dr. Fredricka Bonine on 09/29/22 - Doing well post op. Tolerating cld without n/v, appropriately ttp, pain well controlled, voiding well, ambulating well, vital signs stable, incisions c/d/I. Will adv to reg diet. If tolerates diet, plan d/c today. Discussed discharge instructions, restrictions and return/call back precautions. Will arrange f/u.   FEN - Adv to Reg diet. SLIV VTE - SCDs, Lovenox ID - Rocephin/Flagyl while here. Afebrile. WBC normalized. No further abx at d/c   LOS: 1 day    Jacinto Halim , [facility] 09/30/2022, 10:43 AM Please see Amion for pager number during day hours 7:00am-4:30pm

## 2022-09-30 NOTE — Discharge Instructions (Signed)
[facility]  Please arrive at least 30 min before your appointment to complete your check in paperwork.  If you are unable to arrive 30 min prior to your appointment time we may have to cancel or reschedule you. LAPAROSCOPIC SURGERY: POST OP INSTRUCTIONS Always review your discharge instruction sheet given to you by the facility where your surgery was performed. IF YOU HAVE DISABILITY OR FAMILY LEAVE FORMS, YOU MUST BRING THEM TO THE OFFICE FOR PROCESSING.   DO NOT GIVE THEM TO YOUR DOCTOR.  PAIN CONTROL  First take acetaminophen (Tylenol) AND/or ibuprofen (Advil) to control your pain after surgery.  Follow directions on package.  Taking acetaminophen (Tylenol) and/or ibuprofen (Advil) regularly after surgery will help to control your pain and lower the amount of prescription pain medication you may need.  You should not take more than 4,000 mg (4 grams) of acetaminophen (Tylenol) in 24 hours.  You should not take ibuprofen (Advil), aleve, motrin, naprosyn or other NSAIDS if you have a history of stomach ulcers or chronic kidney disease.  A prescription for pain medication may be given to you upon discharge.  Take your pain medication as prescribed, if you still have uncontrolled pain after taking acetaminophen (Tylenol) or ibuprofen (Advil). Use ice packs to help control pain. If you need a refill on your pain medication, please contact your pharmacy.  They will contact our office to request authorization. Prescriptions will not be filled after 5pm or on week-ends.  HOME MEDICATIONS Take your usually prescribed medications unless otherwise directed.  DIET You should follow a light diet the first few days after arrival home.  Be sure to include lots of fluids daily. Avoid fatty, fried foods.   CONSTIPATION It is common to experience some constipation after surgery and if you are taking pain medication.  Increasing fluid intake and taking a stool softener (such as Colace)  will usually help or prevent this problem from occurring.  A mild laxative (Milk of Magnesia or Miralax) should be taken according to package instructions if there are no bowel movements after 48 hours.  WOUND/INCISION CARE Most patients will experience some swelling and bruising in the area of the incisions.  Ice packs will help.  Swelling and bruising can take several days to resolve.  Unless discharge instructions indicate otherwise, follow guidelines below  STERI-STRIPS - you may remove your outer bandages 48 hours after surgery, and you may shower at that time.  You have steri-strips (small skin tapes) in place directly over the incision.  These strips should be left on the skin for 7-10 days.   DERMABOND/SKIN GLUE - you may shower in 24 hours.  The glue will flake off over the next 2-3 weeks. Any sutures or staples will be removed at the office during your follow-up visit.  ACTIVITIES You may resume regular (light) daily activities beginning the next day--such as daily self-care, walking, climbing stairs--gradually increasing activities as tolerated.  You may have sexual intercourse when it is comfortable.  Refrain from any heavy lifting or straining until approved by your doctor. You may drive when you are no longer taking prescription pain medication, you can comfortably wear a seatbelt, and you can safely maneuver your car and apply brakes.  FOLLOW-UP You should see your doctor in the office for a follow-up appointment approximately 2-3 weeks after your surgery.  You should have been given your post-op/follow-up appointment when your surgery was scheduled.  If you did not receive a post-op/follow-up appointment, make sure   that you call for this appointment within a day or two after you arrive home to insure a convenient appointment time.   WHEN TO CALL YOUR DOCTOR: Fever over 101.0 Inability to urinate Continued bleeding from incision. Increased pain, redness, or drainage from the  incision. Increasing abdominal pain  The clinic staff is available to answer your questions during regular business hours.  Please don't hesitate to call and ask to speak to one of the nurses for clinical concerns.  If you have a medical emergency, go to the nearest emergency room or call 911.  A surgeon from [facility] is always on call at the hospital. [address], [address], [place], [place]  [address] ? [address] [address], [place], [place]   [address] [phone] ? [phone] ? FAX [phone]  

## 2022-10-01 ENCOUNTER — Telehealth: Payer: Self-pay

## 2022-10-01 LAB — SURGICAL PATHOLOGY

## 2022-10-01 NOTE — Transitions of Care (Post Inpatient/ED Visit) (Unsigned)
   10/01/2022  Name: Linda Richard MRN: [id number] DOB: February 05, 1951  Today's TOC FU Call Status: Today's TOC FU Call Status:: Unsuccessul Call (1st Attempt) Unsuccessful Call (1st Attempt) Date: 10/01/22  Attempted to reach the patient regarding the most recent Inpatient/ED visit.  Follow Up Plan: Additional outreach attempts will be made to reach the patient to complete the Transitions of Care (Post Inpatient/ED visit) call.   Signature   Agnes Lawrence, CMA (AAMA)  [facility] [phone]

## 2022-10-02 NOTE — Transitions of Care (Post Inpatient/ED Visit) (Unsigned)
   10/02/2022  Name: Linda Richard MRN: [id number] DOB: 02-27-1951  Today's TOC FU Call Status: Today's TOC FU Call Status:: Unsuccessful Call (2nd Attempt) Unsuccessful Call (1st Attempt) Date: 10/01/22 Unsuccessful Call (2nd Attempt) Date: 10/02/22  Attempted to reach the patient regarding the most recent Inpatient/ED visit.  Follow Up Plan: Additional outreach attempts will be made to reach the patient to complete the Transitions of Care (Post Inpatient/ED visit) call.   Signature Agnes Lawrence, CMA (AAMA)  [facility] [phone]

## 2022-10-03 NOTE — Transitions of Care (Post Inpatient/ED Visit) (Signed)
10/03/2022  Name: Linda Richard MRN: [id number] DOB: 23-Nov-1950  Today's TOC FU Call Status: Today's TOC FU Call Status:: Successful TOC FU Call Competed Unsuccessful Call (1st Attempt) Date: 10/01/22 Unsuccessful Call (2nd Attempt) Date: 10/02/22 [facility] FU Call Complete Date: 10/03/22  Transition Care Management Follow-up Telephone Call Date of Discharge: 09/30/22 Discharge Facility: [facility]) Type of Discharge: Inpatient Admission Primary Inpatient Discharge Diagnosis:: Acute appendicitis How have you been since you were released from the hospital?: Better Any questions or concerns?: No  Items Reviewed: Did you receive and understand the discharge instructions provided?: Yes Medications obtained,verified, and reconciled?: Yes (Medications Reviewed) Any new allergies since your discharge?: No Dietary orders reviewed?: NA Do you have support at home?: Yes People in Home: significant other  Medications Reviewed Today: Medications Reviewed Today     Reviewed by Leigh Aurora, CMA (Certified Medical Assistant) on 10/03/22 at 1653  Med List Status: <None>   Medication Order Taking? Sig Documenting Provider Last Dose Status Informant  acetaminophen (TYLENOL) 500 MG tablet [id number]  Take 2 tablets (1,000 mg total) by mouth every 8 (eight) hours as needed. Princella Pellegrini  Active   aspirin 81 MG tablet [id number] No Take 81 mg by mouth daily.   [provider] Past Week Active Self, Pharmacy Records  busPIRone (BUSPAR) 7.5 MG tablet [id number] No Take 7.5 mg by mouth 2 (two) times daily. Olivia Mackie, MD 09/28/2022 Active Self, Pharmacy Records           Med Note (CRUTHIS, Seth Bake Sep 29, 2022 10:48 AM)    calcium-vitamin D (OSCAL WITH D) 500-200 MG-UNIT per tablet [id number] No Take 1 tablet by mouth daily.   [provider] Past Week Active Self  escitalopram (LEXAPRO) 10 MG tablet [id number] No Take 10 mg by mouth daily. Olivia Mackie, MD  09/28/2022 Active Self, Pharmacy Records           Med Note (CRUTHIS, Seth Bake Sep 29, 2022 10:48 AM)    estradiol (ESTRACE) 0.1 MG/GM vaginal cream [id number] No Place 1 g vaginally 2 (two) times a week. [provider] Past Week Active Self, Pharmacy Records  methocarbamol (ROBAXIN) 500 MG tablet [id number]  Take 1 tablet (500 mg total) by mouth every 6 (six) hours as needed for muscle spasms. Maczis, Elmer Sow, PA-C  Active   Multiple Vitamins-Minerals (ONE DAILY WOMENS 50+ PO) [id number] No Take 1 tablet by mouth daily. [provider] Past Week Active Self  ondansetron (ZOFRAN-ODT) 4 MG disintegrating tablet [id number] No Take 1 tablet (4 mg total) by mouth every 8 (eight) hours as needed for nausea or vomiting.  Patient taking differently: Take 4 mg by mouth as needed for nausea or vomiting.   Maretta Bees, [place] 09/28/2022 Active Self, Pharmacy Records  Polyvinyl Alcohol-Povidone [facility] OP) [id number] No Place 1 drop into both eyes 2 (two) times daily as needed (dry eye). [provider] Past Week Active Self  Probiotic Product (PROBIOTIC DAILY PO) [id number] No Take 1 capsule by mouth daily. [provider] Past Week Active Self  traMADol (ULTRAM) 50 MG tablet [id number]  Take 1 tablet (50 mg total) by mouth every 6 (six) hours as needed for severe pain. Maczis, Elmer Sow, PA-C  Active   tretinoin (RETIN-A) 0.025 % cream [id number] No Apply 1 Application topically at bedtime. [provider] 09/27/2022 Active Self, Pharmacy Records  Home Care and Equipment/Supplies: Were Home Health Services Ordered?: NA Any new equipment or medical supplies ordered?: NA  Functional Questionnaire: Do you need assistance with bathing/showering or dressing?: No Do you need assistance with meal preparation?: No Do you need assistance with eating?: No Do you have difficulty maintaining continence: No Do you need assistance with getting out of  bed/getting out of a chair/moving?: No Do you have difficulty managing or taking your medications?: No  Follow up appointments reviewed: PCP Follow-up appointment confirmed?: NA (declined) Specialist Hospital Follow-up appointment confirmed?: Yes Date of Specialist follow-up appointment?: 10/24/22 Follow-Up Specialty Provider:: Dr. Fredricka Bonine Do you need transportation to your follow-up appointment?: No Do you understand care options if your condition(s) worsen?: Yes-patient verbalized understanding    SIGNATURE Agnes Lawrence, CMA (AAMA)  [facility] [phone]

## 2022-10-03 NOTE — Discharge Summary (Signed)
Patient ID: ABIE GROH [id number] December 19, 1950 72 y.o.  Admit date: 09/28/2022 Discharge date: 09/30/22   Discharge Diagnosis S/p laparoscopic appendectomy for acute gangrenous appendicitis without perforation with localized purulent peritonitis extending onto multiple loops of adjacent small bowel and abdominal wall by Dr. Fredricka Bonine on 09/29/22   H&P: 89F with acute onset of abdominal pain that began 5/9 associated with nausea, vomiting, and anorexia. Pain described as diffuse, worse in BLQ. Denies fever. Last c-scope 2016.   Procedures Dr. Fredricka Bonine - Laparoscopic appendectomy - 09/29/22  Hospital Course:  Patient presented as above and was found to have acute appendicitis. Patient underwent laparoscopic appendectomy by Dr. Fredricka Bonine on 5/12 and tolerated the procedure well. Remained on IV abx post op while in the hospital. On POD 1, the patient was voiding well, tolerating diet, ambulating well, pain well controlled, vital signs stable, incisions c/d/i and felt stable for discharge home. No further abx prescribed after d/c. Discussed discharge instructions, restrictions and return/call back precautions. Follow up as noted below.   Physical Exam: See progress note from earlier today.   Allergies as of 09/30/2022   No Known Allergies      Medication List     STOP taking these medications    acetaminophen 650 MG CR tablet Commonly known as: TYLENOL Replaced by: acetaminophen 500 MG tablet       TAKE these medications    acetaminophen 500 MG tablet Commonly known as: TYLENOL Take 2 tablets (1,000 mg total) by mouth every 8 (eight) hours as needed. Replaces: acetaminophen 650 MG CR tablet   aspirin 81 MG tablet Take 81 mg by mouth daily.   busPIRone 7.5 MG tablet Commonly known as: BUSPAR Take 7.5 mg by mouth 2 (two) times daily.   calcium-vitamin D 500-200 MG-UNIT tablet Commonly known as: OSCAL WITH D Take 1 tablet by mouth daily.   escitalopram 10 MG  tablet Commonly known as: LEXAPRO Take 10 mg by mouth daily.   estradiol 0.1 MG/GM vaginal cream Commonly known as: ESTRACE Place 1 g vaginally 2 (two) times a week.   methocarbamol 500 MG tablet Commonly known as: ROBAXIN Take 1 tablet (500 mg total) by mouth every 6 (six) hours as needed for muscle spasms.   ondansetron 4 MG disintegrating tablet Commonly known as: ZOFRAN-ODT Take 1 tablet (4 mg total) by mouth every 8 (eight) hours as needed for nausea or vomiting. What changed: when to take this   ONE DAILY WOMENS 50+ PO Take 1 tablet by mouth daily.   PROBIOTIC DAILY PO Take 1 capsule by mouth daily.   REFRESH OP Place 1 drop into both eyes 2 (two) times daily as needed (dry eye).   traMADol 50 MG tablet Commonly known as: ULTRAM Take 1 tablet (50 mg total) by mouth every 6 (six) hours as needed for severe pain.   tretinoin 0.025 % cream Commonly known as: RETIN-A Apply 1 Application topically at bedtime.          Follow-up Information     Berna Bue, MD Follow up on 10/24/2022.   Specialty: General Surgery Why: 950am. Please arrive 30 minutes prior to your appointment for paperwork. Please bring a copy of your photo ID and insurance card. Contact information: [address] [place] [address] [phone]                 Signed: Leary Roca, [facility] 10/03/2022, 7:51 AM Please see Amion for pager number during  day hours 7:00am-4:30pm

## 2022-10-09 ENCOUNTER — Ambulatory Visit: Payer: Self-pay | Admitting: Surgery

## 2022-10-09 NOTE — H&P (View-Only) (Signed)
   Linda Richard [id number]  DATE OF ENCOUNTER: 10/09/2022 Interval History:   Presents for scheduled follow-up after emergent appendectomy for acute gangrenous appendicitis localized peritonitis 10 days ago.  She was discharged home on postop day 1.  She had been doing fairly well at home.  Reports she has not quite gotten her appetite back, has had some nausea/hunger sensations.  Her bowels are moving regularly.  Notes pain from her left inguinal hernia in her left lower quadrant port site but otherwise no abdominal pain.  No fevers.  Physical Examination:   There were no vitals filed for this visit.  Alert, well-appearing Unlabored respirations Abdomen is soft, nontender.  Incisions healing well.  Expected ecchymosis surrounding lower incisions.  No incisional hernia.  Her left inguinal hernia has protruded which she states began a few days after she got home and has not been reducible.  It is uncomfortable but manageable. Pathology demonstrates acute appendicitis with possible areas of transmural necrosis  Assessment and Plan:   Recovering appropriately.  Discussed expectations for ongoing recovery, activity limitations if applicable, and reasons to call.  She has known left inguinal hernia which was notable IntraOp.  This has subsequently become incarcerated.  I was not able to reduce this today.  It is relatively nontender and her abdominal exam is benign.  I recommend proceeding with repair and I recommend a robotic approach which I went over with her in detail.  We discussed risks of bleeding, infection, pain, scarring, injury to intra-abdominal retroperitoneal structures including bowel, blood vessels, nerves, bladder, risk of hernia recurrence, seroma/hematoma or need to convert to open surgery.  I also discussed with her signs and symptoms that should prompt her to seek emergency treatment including worsening pain, abdominal distention, vomiting, fever.  She expressed understanding.   We will plan to proceed with robotic left, possible bilateral inguinal hernia repair in the next month or so.  09/29/22 Procedure performed: Laparoscopic appendectomy   Preop diagnosis: Acute appendicitis Post-op diagnosis/intraop findings: Acute gangrenous appendicitis without perforation with localized purulent peritonitis extending onto multiple loops of adjacent small bowel and abdominal wall.   Linda Janus [name], MD  

## 2022-10-09 NOTE — H&P (Signed)
   CARROLE MCMOORE [id number]  DATE OF ENCOUNTER: 10/09/2022 Interval History:   Presents for scheduled follow-up after emergent appendectomy for acute gangrenous appendicitis localized peritonitis 10 days ago.  She was discharged home on postop day 1.  She had been doing fairly well at home.  Reports she has not quite gotten her appetite back, has had some nausea/hunger sensations.  Her bowels are moving regularly.  Notes pain from her left inguinal hernia in her left lower quadrant port site but otherwise no abdominal pain.  No fevers.  Physical Examination:   There were no vitals filed for this visit.  Alert, well-appearing Unlabored respirations Abdomen is soft, nontender.  Incisions healing well.  Expected ecchymosis surrounding lower incisions.  No incisional hernia.  Her left inguinal hernia has protruded which she states began a few days after she got home and has not been reducible.  It is uncomfortable but manageable. Pathology demonstrates acute appendicitis with possible areas of transmural necrosis  Assessment and Plan:   Recovering appropriately.  Discussed expectations for ongoing recovery, activity limitations if applicable, and reasons to call.  She has known left inguinal hernia which was notable IntraOp.  This has subsequently become incarcerated.  I was not able to reduce this today.  It is relatively nontender and her abdominal exam is benign.  I recommend proceeding with repair and I recommend a robotic approach which I went over with her in detail.  We discussed risks of bleeding, infection, pain, scarring, injury to intra-abdominal retroperitoneal structures including bowel, blood vessels, nerves, bladder, risk of hernia recurrence, seroma/hematoma or need to convert to open surgery.  I also discussed with her signs and symptoms that should prompt her to seek emergency treatment including worsening pain, abdominal distention, vomiting, fever.  She expressed understanding.   We will plan to proceed with robotic left, possible bilateral inguinal hernia repair in the next month or so.  09/29/22 Procedure performed: Laparoscopic appendectomy   Preop diagnosis: Acute appendicitis Post-op diagnosis/intraop findings: Acute gangrenous appendicitis without perforation with localized purulent peritonitis extending onto multiple loops of adjacent small bowel and abdominal wall.   Khaliq Turay Carlye Grippe, MD

## 2022-10-16 ENCOUNTER — Encounter (HOSPITAL_COMMUNITY): Payer: Self-pay

## 2022-10-18 NOTE — Patient Instructions (Addendum)
DUE TO COVID-19 ONLY TWO VISITORS  (aged 70 and older)  ARE ALLOWED TO COME WITH YOU AND STAY IN THE WAITING ROOM ONLY DURING PRE OP AND PROCEDURE.   **NO VISITORS ARE ALLOWED IN THE SHORT STAY AREA OR RECOVERY ROOM!!**  IF YOU WILL BE ADMITTED INTO THE HOSPITAL YOU ARE ALLOWED ONLY FOUR SUPPORT PEOPLE DURING VISITATION HOURS ONLY (7 AM -8PM)   The support person(s) must pass our screening, gel in and out, and wear a mask at all times, including in the patient's room. Patients must also wear a mask when staff or their support person are in the room. Visitors GUEST BADGE MUST BE WORN VISIBLY  One adult visitor may remain with you overnight and MUST be in the room by 8 P.M.     Your procedure is scheduled on: 10/28/22   Report to [facility] Main Entrance    Report to admitting at: 6:45 AM   Call this number if you have problems the morning of surgery [phone]   Do not eat food :After Midnight.   After Midnight you may have the following liquids until : 6:00 AM DAY OF SURGERY  Water Black Coffee (sugar ok, NO MILK/CREAM OR CREAMERS)  Tea (sugar ok, NO MILK/CREAM OR CREAMERS) regular and decaf                             Plain Jell-O (NO RED)                                           Fruit ices (not with fruit pulp, NO RED)                                     Popsicles (NO RED)                                                                  Juice: apple, WHITE grape, WHITE cranberry Sports drinks like Gatorade (NO RED)               Oral Hygiene is also important to reduce your risk of infection.                                    Remember - BRUSH YOUR TEETH THE MORNING OF SURGERY WITH YOUR REGULAR TOOTHPASTE  DENTURES WILL BE REMOVED PRIOR TO SURGERY PLEASE DO NOT APPLY "Poly grip" OR ADHESIVES!!!   Do NOT smoke after Midnight   Take these medicines the morning of surgery with A SIP OF WATER: buspirone,escitalopram.Tylenol as needed.                              You  may not have any metal on your body including hair pins, jewelry, and body piercing             Do not wear make-up, lotions, powders, perfumes/cologne, or deodorant  Do not wear nail polish including gel and S&S, artificial/acrylic nails, or any other type of covering on natural nails including finger and toenails. If you have artificial nails, gel coating, etc. that needs to be removed by a nail salon please have this removed prior to surgery or surgery may need to be canceled/ delayed if the surgeon/ anesthesia feels like they are unable to be safely monitored.   Do not shave  48 hours prior to surgery.    Do not bring valuables to the hospital. [facility] IS NOT             RESPONSIBLE   FOR VALUABLES.   Contacts, glasses, or bridgework may not be worn into surgery.   Bring small overnight bag day of surgery.   DO NOT BRING YOUR HOME MEDICATIONS TO THE HOSPITAL. PHARMACY WILL DISPENSE MEDICATIONS LISTED ON YOUR MEDICATION LIST TO YOU DURING YOUR ADMISSION IN THE HOSPITAL!    Patients discharged on the day of surgery will not be allowed to drive home.  Someone NEEDS to stay with you for the first 24 hours after anesthesia.   Special Instructions: Bring a copy of your healthcare power of attorney and living will documents         the day of surgery if you haven't scanned them before.              Please read over the following fact sheets you were given: IF YOU HAVE QUESTIONS ABOUT YOUR PRE-OP INSTRUCTIONS PLEASE CALL [phone]    [facility] - Preparing for Surgery Before surgery, you can play an important role.  Because skin is not sterile, your skin needs to be as free of germs as possible.  You can reduce the number of germs on your skin by washing with CHG (chlorahexidine gluconate) soap before surgery.  CHG is an antiseptic cleaner which kills germs and bonds with the skin to continue killing germs even after washing. Please DO NOT use if you have an allergy to CHG or  antibacterial soaps.  If your skin becomes reddened/irritated stop using the CHG and inform your nurse when you arrive at Short Stay. Do not shave (including legs and underarms) for at least 48 hours prior to the first CHG shower.  You may shave your face/neck. Please follow these instructions carefully:  1.  Shower with CHG Soap the night before surgery and the  morning of Surgery.  2.  If you choose to wash your hair, wash your hair first as usual with your  normal  shampoo.  3.  After you shampoo, rinse your hair and body thoroughly to remove the  shampoo.                           4.  Use CHG as you would any other liquid soap.  You can apply chg directly  to the skin and wash                       Gently with a scrungie or clean washcloth.  5.  Apply the CHG Soap to your body ONLY FROM THE NECK DOWN.   Do not use on face/ open                           Wound or open sores. Avoid contact with eyes, ears mouth and genitals (private parts).  Wash face,  Genitals (private parts) with your normal soap.             6.  Wash thoroughly, paying special attention to the area where your surgery  will be performed.  7.  Thoroughly rinse your body with warm water from the neck down.  8.  DO NOT shower/wash with your normal soap after using and rinsing off  the CHG Soap.                9.  Pat yourself dry with a clean towel.            10.  Wear clean pajamas.            11.  Place clean sheets on your bed the night of your first shower and do not  sleep with pets. Day of Surgery : Do not apply any lotions/deodorants the morning of surgery.  Please wear clean clothes to the hospital/surgery center.  FAILURE TO FOLLOW THESE INSTRUCTIONS MAY RESULT IN THE CANCELLATION OF YOUR SURGERY PATIENT SIGNATURE_________________________________  NURSE SIGNATURE__________________________________  ________________________________________________________________________

## 2022-10-22 ENCOUNTER — Encounter (HOSPITAL_COMMUNITY)
Admission: RE | Admit: 2022-10-22 | Discharge: 2022-10-22 | Disposition: A | Discharge status: Home / Self Care | Payer: Medicare Other | Source: Ambulatory Visit | Attending: Surgery | Admitting: Surgery

## 2022-10-22 ENCOUNTER — Encounter (HOSPITAL_COMMUNITY): Payer: Self-pay

## 2022-10-22 ENCOUNTER — Other Ambulatory Visit: Payer: Self-pay

## 2022-10-22 VITALS — BP 137/75 | HR 98 | Temp 98.1°F | Ht 68.5 in | Wt 125.0 lb

## 2022-10-22 DIAGNOSIS — Z01818 Encounter for other preprocedural examination: Secondary | ICD-10-CM | POA: Insufficient documentation

## 2022-10-22 LAB — CBC
HCT: 43 % (ref 36.0–46.0)
Hemoglobin: 14 g/dL (ref 12.0–15.0)
MCH: 30.6 pg (ref 26.0–34.0)
MCHC: 32.6 g/dL (ref 30.0–36.0)
MCV: 94.1 fL (ref 80.0–100.0)
Platelets: 237 10*3/uL (ref 150–400)
RBC: 4.57 MIL/uL (ref 3.87–5.11)
RDW: 12 % (ref 11.5–15.5)
WBC: 7 10*3/uL (ref 4.0–10.5)
nRBC: 0 % (ref 0.0–0.2)

## 2022-10-22 LAB — BASIC METABOLIC PANEL
Anion gap: 9 (ref 5–15)
BUN: 14 mg/dL (ref 8–23)
CO2: 27 mmol/L (ref 22–32)
Calcium: 9.4 mg/dL (ref 8.9–10.3)
Chloride: 101 mmol/L (ref 98–111)
Creatinine, Ser: 0.41 mg/dL — ABNORMAL LOW (ref 0.44–1.00)
GFR, Estimated: >60 mL/min (ref >60)
Glucose, Bld: 101 mg/dL — ABNORMAL HIGH (ref 70–99)
Potassium: 4.7 mmol/L (ref 3.5–5.1)
Sodium: 137 mmol/L (ref 135–145)

## 2022-10-22 NOTE — Progress Notes (Signed)
For Short Stay: COVID SWAB appointment date:  Bowel Prep reminder:   For Anesthesia: PCP - Dr. Berniece Andreas Cardiologist - N/A  Chest x-ray -  EKG -  Stress Test -  ECHO -  Cardiac Cath -  Pacemaker/ICD device last checked: Pacemaker orders received: Device Rep notified:  Spinal Cord Stimulator: N/A  Sleep Study - N/A CPAP -   Fasting Blood Sugar - N/A Checks Blood Sugar _____ times a day Date and result of last Hgb A1c-  Last dose of GLP1 agonist-  GLP1 instructions:   Last dose of SGLT-2 inhibitors- N/A SGLT-2 instructions:   Blood Thinner Instructions: Aspirin Instructions: No instructions,but pt. Is planing to hold it 5 days before surgery. Last Dose:  Activity level: Can go up a flight of stairs and activities of daily living without stopping and without chest pain and/or shortness of breath   Able to exercise without chest pain and/or shortness of breath  Anesthesia review: Hx: DVT.  Patient denies shortness of breath, fever, cough and chest pain at PAT appointment   Patient verbalized understanding of instructions that were given to them at the PAT appointment. Patient was also instructed that they will need to review over the PAT instructions again at home before surgery.

## 2022-10-27 NOTE — Anesthesia Preprocedure Evaluation (Signed)
Anesthesia Evaluation    Reviewed: Allergy & Precautions, Patient's Chart, lab work & pertinent test results  Airway Mallampati: I  TM Distance: >3 FB Neck ROM: Full    Dental  (+) Teeth Intact, Dental Advisory Given   Pulmonary neg pulmonary ROS, pneumonia   Pulmonary exam normal breath sounds clear to auscultation       Cardiovascular + DVT  Normal cardiovascular exam Rhythm:Regular Rate:Normal     Neuro/Psych  PSYCHIATRIC DISORDERS Anxiety     negative neurological ROS     GI/Hepatic Neg liver ROS,,,Appendicitis    Endo/Other  negative endocrine ROS    Renal/GU Renal diseasenegative Renal ROS     Musculoskeletal negative musculoskeletal ROS (+)    Abdominal   Peds  Hematology negative hematology ROS (+)   Anesthesia Other Findings Day of surgery medications reviewed with the patient.  Reproductive/Obstetrics                             Anesthesia Physical Anesthesia Plan  ASA: 2  Anesthesia Plan: General   Post-op Pain Management: Tylenol PO (pre-op)* and Celebrex PO (pre-op)*   Induction: Intravenous  PONV Risk Score and Plan: 3 and Dexamethasone and Ondansetron  Airway Management Planned: Oral ETT  Additional Equipment:   Intra-op Plan:   Post-operative Plan: Extubation in OR  Informed Consent: I have reviewed the patients History and Physical, chart, labs and discussed the procedure including the risks, benefits and alternatives for the proposed anesthesia with the patient or authorized representative who has indicated his/her understanding and acceptance.     Dental advisory given  Plan Discussed with: CRNA  Anesthesia Plan Comments:        Anesthesia Quick Evaluation

## 2022-10-28 ENCOUNTER — Ambulatory Visit (HOSPITAL_BASED_OUTPATIENT_CLINIC_OR_DEPARTMENT_OTHER): Payer: Medicare Other | Admitting: Anesthesiology

## 2022-10-28 ENCOUNTER — Other Ambulatory Visit: Payer: Self-pay

## 2022-10-28 ENCOUNTER — Ambulatory Visit (HOSPITAL_COMMUNITY)
Admission: RE | Admit: 2022-10-28 | Discharge: 2022-10-28 | Disposition: A | Discharge status: Home / Self Care | Payer: Medicare Other | Attending: Surgery | Admitting: Surgery

## 2022-10-28 ENCOUNTER — Ambulatory Visit (HOSPITAL_COMMUNITY): Payer: Medicare Other | Admitting: Anesthesiology

## 2022-10-28 ENCOUNTER — Encounter (HOSPITAL_COMMUNITY): Payer: Self-pay | Admitting: Surgery

## 2022-10-28 ENCOUNTER — Encounter (HOSPITAL_COMMUNITY)
Admission: RE | Disposition: A | Discharge status: Home / Self Care | Payer: Self-pay | Source: Home / Self Care | Attending: Surgery

## 2022-10-28 DIAGNOSIS — K4090 Unilateral inguinal hernia, without obstruction or gangrene, not specified as recurrent: Secondary | ICD-10-CM | POA: Diagnosis present

## 2022-10-28 DIAGNOSIS — I82409 Acute embolism and thrombosis of unspecified deep veins of unspecified lower extremity: Secondary | ICD-10-CM | POA: Diagnosis not present

## 2022-10-28 DIAGNOSIS — K4030 Unilateral inguinal hernia, with obstruction, without gangrene, not specified as recurrent: Secondary | ICD-10-CM | POA: Diagnosis not present

## 2022-10-28 DIAGNOSIS — Z86718 Personal history of other venous thrombosis and embolism: Secondary | ICD-10-CM | POA: Insufficient documentation

## 2022-10-28 DIAGNOSIS — F419 Anxiety disorder, unspecified: Secondary | ICD-10-CM

## 2022-10-28 DIAGNOSIS — J189 Pneumonia, unspecified organism: Secondary | ICD-10-CM | POA: Diagnosis not present

## 2022-10-28 SURGERY — REPAIR, HERNIA, INGUINAL, BILATERAL, ROBOT-ASSISTED
Anesthesia: General | Laterality: Left

## 2022-10-28 MED ORDER — SUGAMMADEX SODIUM 200 MG/2ML IV SOLN
INTRAVENOUS | Status: DC | PRN
  Administered 2022-10-28: 130 mg via INTRAVENOUS

## 2022-10-28 MED ORDER — CHLORHEXIDINE GLUCONATE 0.12 % MT SOLN
15.0000 mL | Freq: Once | OROMUCOSAL | Status: AC
  Administered 2022-10-28: 15 mL via OROMUCOSAL

## 2022-10-28 MED ORDER — LIDOCAINE HCL (PF) 2 % IJ SOLN
INTRAMUSCULAR | Status: AC
  Filled 2022-10-28: qty 5

## 2022-10-28 MED ORDER — ACETAMINOPHEN 500 MG PO TABS: 1000.0000 mg | ORAL_TABLET | Freq: Once | ORAL | Status: DC

## 2022-10-28 MED ORDER — BUPIVACAINE LIPOSOME 1.3 % IJ SUSP: 20.0000 mL | Freq: Once | INTRAMUSCULAR | Status: DC

## 2022-10-28 MED ORDER — ACETAMINOPHEN 325 MG PO TABS: 325.0000 mg | ORAL_TABLET | ORAL | Status: DC | PRN

## 2022-10-28 MED ORDER — BUPIVACAINE LIPOSOME 1.3 % IJ SUSP
INTRAMUSCULAR | Status: DC | PRN
  Administered 2022-10-28: 20 mL

## 2022-10-28 MED ORDER — FENTANYL CITRATE PF 50 MCG/ML IJ SOSY: 25.0000 ug | PREFILLED_SYRINGE | INTRAMUSCULAR | Status: DC | PRN

## 2022-10-28 MED ORDER — MEPERIDINE HCL 50 MG/ML IJ SOLN: 6.2500 mg | INTRAMUSCULAR | Status: DC | PRN

## 2022-10-28 MED ORDER — PROPOFOL 10 MG/ML IV BOLUS
INTRAVENOUS | Status: AC
  Filled 2022-10-28: qty 20

## 2022-10-28 MED ORDER — OXYCODONE HCL 5 MG PO TABS: 5.0000 mg | ORAL_TABLET | Freq: Once | ORAL | Status: DC | PRN

## 2022-10-28 MED ORDER — DEXAMETHASONE SODIUM PHOSPHATE 10 MG/ML IJ SOLN
INTRAMUSCULAR | Status: DC | PRN
  Administered 2022-10-28: 8 mg via INTRAVENOUS

## 2022-10-28 MED ORDER — ORAL CARE MOUTH RINSE: 15.0000 mL | Freq: Once | OROMUCOSAL | Status: AC

## 2022-10-28 MED ORDER — BUPIVACAINE HCL 0.25 % IJ SOLN
INTRAMUSCULAR | Status: AC
  Filled 2022-10-28: qty 1

## 2022-10-28 MED ORDER — METHYLENE BLUE 1 % INJ SOLN
INTRAVENOUS | Status: DC | PRN
  Administered 2022-10-28: 1 mL

## 2022-10-28 MED ORDER — ACETAMINOPHEN 160 MG/5ML PO SOLN: 325.0000 mg | ORAL | Status: DC | PRN

## 2022-10-28 MED ORDER — FENTANYL CITRATE (PF) 100 MCG/2ML IJ SOLN
INTRAMUSCULAR | Status: AC
  Filled 2022-10-28: qty 2

## 2022-10-28 MED ORDER — MIDAZOLAM HCL 5 MG/5ML IJ SOLN
INTRAMUSCULAR | Status: DC | PRN
  Administered 2022-10-28: 2 mg via INTRAVENOUS

## 2022-10-28 MED ORDER — ACETAMINOPHEN 325 MG PO TABS
650.0000 mg | ORAL_TABLET | ORAL | Status: DC | PRN
  Administered 2022-10-28: 650 mg via ORAL

## 2022-10-28 MED ORDER — MIDAZOLAM HCL 2 MG/2ML IJ SOLN
INTRAMUSCULAR | Status: AC
  Filled 2022-10-28: qty 2

## 2022-10-28 MED ORDER — LIDOCAINE 2% (20 MG/ML) 5 ML SYRINGE
INTRAMUSCULAR | Status: DC | PRN
  Administered 2022-10-28: 60 mg via INTRAVENOUS

## 2022-10-28 MED ORDER — ONDANSETRON HCL 4 MG/2ML IJ SOLN: 4.0000 mg | Freq: Once | INTRAMUSCULAR | Status: DC | PRN

## 2022-10-28 MED ORDER — ROCURONIUM BROMIDE 10 MG/ML (PF) SYRINGE
PREFILLED_SYRINGE | INTRAVENOUS | Status: DC | PRN
  Administered 2022-10-28: 15 mg via INTRAVENOUS
  Administered 2022-10-28: 60 mg via INTRAVENOUS
  Administered 2022-10-28: 10 mg via INTRAVENOUS
  Administered 2022-10-28: 15 mg via INTRAVENOUS

## 2022-10-28 MED ORDER — BUPIVACAINE LIPOSOME 1.3 % IJ SUSP
INTRAMUSCULAR | Status: AC
  Filled 2022-10-28: qty 20

## 2022-10-28 MED ORDER — EPHEDRINE 5 MG/ML INJ
INTRAVENOUS | Status: AC
  Filled 2022-10-28: qty 5

## 2022-10-28 MED ORDER — ACETAMINOPHEN 500 MG PO TABS
1000.0000 mg | ORAL_TABLET | ORAL | Status: AC
  Administered 2022-10-28: 1000 mg via ORAL
  Filled 2022-10-28: qty 2

## 2022-10-28 MED ORDER — DEXAMETHASONE SODIUM PHOSPHATE 10 MG/ML IJ SOLN
INTRAMUSCULAR | Status: AC
  Filled 2022-10-28: qty 1

## 2022-10-28 MED ORDER — CEFAZOLIN SODIUM-DEXTROSE 2-4 GM/100ML-% IV SOLN
2.0000 g | INTRAVENOUS | Status: AC
  Administered 2022-10-28: 2 g via INTRAVENOUS
  Filled 2022-10-28: qty 100

## 2022-10-28 MED ORDER — EPHEDRINE SULFATE-NACL 50-0.9 MG/10ML-% IV SOSY
PREFILLED_SYRINGE | INTRAVENOUS | Status: DC | PRN
  Administered 2022-10-28: 5 mg via INTRAVENOUS

## 2022-10-28 MED ORDER — OXYCODONE HCL 5 MG/5ML PO SOLN: 5.0000 mg | Freq: Once | ORAL | Status: DC | PRN

## 2022-10-28 MED ORDER — CELECOXIB 200 MG PO CAPS
200.0000 mg | ORAL_CAPSULE | Freq: Once | ORAL | Status: AC
  Administered 2022-10-28: 200 mg via ORAL
  Filled 2022-10-28: qty 1

## 2022-10-28 MED ORDER — METHYLENE BLUE 1 % INJ SOLN
INTRAVENOUS | Status: AC
  Filled 2022-10-28: qty 10

## 2022-10-28 MED ORDER — ACETAMINOPHEN 650 MG RE SUPP: 650.0000 mg | RECTAL | Status: DC | PRN

## 2022-10-28 MED ORDER — CHLORHEXIDINE GLUCONATE 4 % EX SOLN: 60.0000 mL | Freq: Once | CUTANEOUS | Status: DC

## 2022-10-28 MED ORDER — ONDANSETRON HCL 4 MG/2ML IJ SOLN
INTRAMUSCULAR | Status: DC | PRN
  Administered 2022-10-28: 4 mg via INTRAVENOUS

## 2022-10-28 MED ORDER — FENTANYL CITRATE (PF) 100 MCG/2ML IJ SOLN
INTRAMUSCULAR | Status: DC | PRN
  Administered 2022-10-28: 100 ug via INTRAVENOUS
  Administered 2022-10-28 (×4): 25 ug via INTRAVENOUS

## 2022-10-28 MED ORDER — BUPIVACAINE HCL (PF) 0.25 % IJ SOLN
INTRAMUSCULAR | Status: DC | PRN
  Administered 2022-10-28: 30 mL

## 2022-10-28 MED ORDER — ROCURONIUM BROMIDE 10 MG/ML (PF) SYRINGE
PREFILLED_SYRINGE | INTRAVENOUS | Status: AC
  Filled 2022-10-28: qty 10

## 2022-10-28 MED ORDER — LACTATED RINGERS IV SOLN: INTRAVENOUS | Status: DC

## 2022-10-28 MED ORDER — SODIUM CHLORIDE 0.9 % IR SOLN
Status: DC | PRN
  Administered 2022-10-28: 1000 mL

## 2022-10-28 MED ORDER — OXYCODONE HCL 5 MG PO TABS: 5.0000 mg | ORAL_TABLET | ORAL | Status: DC | PRN

## 2022-10-28 MED ORDER — ACETAMINOPHEN 325 MG PO TABS
ORAL_TABLET | ORAL | Status: AC
  Filled 2022-10-28: qty 2

## 2022-10-28 MED ORDER — ONDANSETRON HCL 4 MG/2ML IJ SOLN
INTRAMUSCULAR | Status: AC
  Filled 2022-10-28: qty 2

## 2022-10-28 MED ORDER — PROPOFOL 10 MG/ML IV BOLUS
INTRAVENOUS | Status: DC | PRN
  Administered 2022-10-28: 140 mg via INTRAVENOUS
  Administered 2022-10-28: 60 mg via INTRAVENOUS

## 2022-10-28 SURGICAL SUPPLY — 55 items
ANTIFOG SOL W/FOAM PAD STRL (MISCELLANEOUS) ×1
APL PRP STRL LF DISP 70% ISPRP (MISCELLANEOUS) ×1
APL SWBSTK 6 STRL LF DISP (MISCELLANEOUS)
APPLICATOR COTTON TIP 6 STRL (MISCELLANEOUS) ×2 IMPLANT
APPLICATOR COTTON TIP 6IN STRL (MISCELLANEOUS)
BAG COUNTER SPONGE SURGICOUNT (BAG) IMPLANT
BAG SPNG CNTER NS LX DISP (BAG)
BLADE SURG SZ11 CARB STEEL (BLADE) ×1 IMPLANT
CHLORAPREP W/TINT 26 (MISCELLANEOUS) ×1 IMPLANT
COVER SURGICAL LIGHT HANDLE (MISCELLANEOUS) ×1 IMPLANT
COVER TIP SHEARS 8 DVNC (MISCELLANEOUS) ×1 IMPLANT
DRAPE ARM DVNC X/XI (DISPOSABLE) ×4 IMPLANT
DRAPE COLUMN DVNC XI (DISPOSABLE) ×1 IMPLANT
DRIVER NDL LRG 8 DVNC XI (INSTRUMENTS) ×1 IMPLANT
DRIVER NDL MEGA SUTCUT DVNCXI (INSTRUMENTS) ×2 IMPLANT
DRIVER NDLE LRG 8 DVNC XI (INSTRUMENTS) ×1 IMPLANT
DRIVER NDLE MEGA SUTCUT DVNCXI (INSTRUMENTS) ×2 IMPLANT
ELECT REM PT RETURN 15FT ADLT (MISCELLANEOUS) ×1 IMPLANT
FORCEPS CADIERE DVNC XI (FORCEP) IMPLANT
GLOVE BIO SURGEON STRL SZ 6 (GLOVE) ×2 IMPLANT
GLOVE INDICATOR 6.5 STRL GRN (GLOVE) ×2 IMPLANT
GLOVE SS BIOGEL STRL SZ 6 (GLOVE) ×1 IMPLANT
GOWN STRL REUS W/ TWL LRG LVL3 (GOWN DISPOSABLE) ×2 IMPLANT
GOWN STRL REUS W/ TWL XL LVL3 (GOWN DISPOSABLE) IMPLANT
GOWN STRL REUS W/TWL LRG LVL3 (GOWN DISPOSABLE) ×2
GOWN STRL REUS W/TWL XL LVL3 (GOWN DISPOSABLE)
GRASPER TIP-UP FEN DVNC XI (INSTRUMENTS) ×1 IMPLANT
IRRIG SUCT STRYKERFLOW 2 WTIP (MISCELLANEOUS)
IRRIGATION SUCT STRKRFLW 2 WTP (MISCELLANEOUS) IMPLANT
KIT BASIN OR (CUSTOM PROCEDURE TRAY) ×1 IMPLANT
KIT TURNOVER KIT A (KITS) IMPLANT
MESH 3DMAX MID 4X6 LT LRG (Mesh General) IMPLANT
NDL HYPO 22X1.5 SAFETY MO (MISCELLANEOUS) ×1 IMPLANT
NDL INSUFFLATION 14GA 120MM (NEEDLE) ×1 IMPLANT
NEEDLE HYPO 22X1.5 SAFETY MO (MISCELLANEOUS) ×1 IMPLANT
NEEDLE INSUFFLATION 14GA 120MM (NEEDLE) ×1 IMPLANT
PACK CARDIOVASCULAR III (CUSTOM PROCEDURE TRAY) ×1 IMPLANT
PAD POSITIONING PINK XL (MISCELLANEOUS) ×1 IMPLANT
SCISSORS MNPLR CVD DVNC XI (INSTRUMENTS) ×1 IMPLANT
SEAL UNIV 5-12 XI (MISCELLANEOUS) ×3 IMPLANT
SET CYSTO W/LG BORE CLAMP LF (SET/KITS/TRAYS/PACK) IMPLANT
SOL ELECTROSURG ANTI STICK (MISCELLANEOUS) ×1
SOLUTION ANTFG W/FOAM PAD STRL (MISCELLANEOUS) ×1 IMPLANT
SOLUTION ELECTROSURG ANTI STCK (MISCELLANEOUS) ×1 IMPLANT
SPIKE FLUID TRANSFER (MISCELLANEOUS) ×1 IMPLANT
SUT MNCRL AB 4-0 PS2 18 (SUTURE) ×1 IMPLANT
SUT VIC AB 3-0 SH 27 (SUTURE) ×1
SUT VIC AB 3-0 SH 27XBRD (SUTURE) ×1 IMPLANT
SUT VLOC 180 2-0 6IN GS21 (SUTURE) ×1 IMPLANT
SUT VLOC 3-0 9IN GRN (SUTURE) IMPLANT
SYR 10ML LL (SYRINGE) ×1 IMPLANT
SYR 20ML LL LF (SYRINGE) ×1 IMPLANT
TOWEL OR 17X26 10 PK STRL BLUE (TOWEL DISPOSABLE) ×1 IMPLANT
TOWEL OR NON WOVEN STRL DISP B (DISPOSABLE) ×1 IMPLANT
TUBING INSUFFLATION 10FT LAP (TUBING) ×1 IMPLANT

## 2022-10-28 NOTE — Discharge Instructions (Signed)
HERNIA REPAIR: POST OP INSTRUCTIONS   EAT Gradually transition to a high fiber diet with a fiber supplement over the next few weeks after discharge.   WALK Walk an hour a day (cumulative- not all at once).  Control your pain to do that.    CONTROL PAIN Control pain so that you can walk, sleep, tolerate sneezing/coughing, and go up/down stairs.  HAVE A BOWEL MOVEMENT DAILY Keep your bowels regular to avoid problems.  OK to try a laxative to override constipation.  OK to use an antidiarrheal to slow down diarrhea.  Call if not better after 2 tries  CALL IF YOU HAVE PROBLEMS/CONCERNS Call if you are still struggling despite following these instructions. Call if you have concerns not answered by these instructions  ######################################################################    DIET: Follow a light bland diet & liquids the first 24 hours after arrival home, such as soup, liquids, starches, etc.  Be sure to drink plenty of fluids.  Quickly advance to a usual solid diet within a few days.  Avoid fast food or heavy meals initially as you are more likely to get nauseated or have irregular bowels.    Take your usually prescribed home medications unless otherwise directed.  PAIN CONTROL: Pain is best controlled by a usual combination of three different methods TOGETHER: Ice/Heat Over the counter pain medication Prescription pain medication Most patients will experience some swelling and bruising around the hernia(s) such as the bellybutton, groins, or old incisions.  Ice packs or heating pads (30-60 minutes up to 6 times a day) will help. Use ice for the first few days to help decrease swelling and bruising, then switch to heat to help relax tight/sore spots and speed recovery.  Some people prefer to use ice alone, heat alone, alternating between ice & heat.  Experiment to what works for you.  Swelling and bruising can take several weeks to resolve.   It is helpful to take an  over-the-counter pain medication regularly for the first days: Naproxen (Aleve, etc)  Two 220mg  tabs twice a day OR Ibuprofen (Advil, etc) Three 200mg  tabs four times a day (every meal & bedtime) AND Acetaminophen (Tylenol, etc) 325-650mg  four times a day (every meal & bedtime) You may use the prescription pain medications you have on hand from your last surgery, IF NEEDED.  If you are having problems/concerns with the prescription medicine (does not control pain, nausea, vomiting, rash, itching, etc), please call us [phone] to see if we need to switch you to a different pain medicine that will work better for you and/or control your side effect better. If you need a refill on your pain medication, please contact your pharmacy.  They will contact our office to request authorization. Prescriptions will not be filled after 5 pm or on week-ends.  Avoid getting constipated.  Between the surgery and the pain medications, it is common to experience some constipation.  Increasing fluid intake and taking a fiber supplement (such as Metamucil, Citrucel, FiberCon, MiraLax, etc) 1-2 times a day regularly will usually help prevent this problem from occurring.  A mild laxative (prune juice, Milk of Magnesia, MiraLax, etc) should be taken according to package directions if there are no bowel movements after 48 hours.    Wash / shower every day, starting 2 days after surgery.  You may shower over the skin glue which is waterproof.  No rubbing, scrubbing, lotions or ointments to incision(s). Do not soak or submerge.   Glue will flake off after  about 2 weeks.  You may leave the incision open to air.  You may replace a dressing/Band-Aid to cover an incision for comfort if you wish.  Continue to shower over incision(s) after the dressing is off.  ACTIVITIES as tolerated:   You may resume regular (light) daily activities beginning the next day--such as daily self-care, walking, climbing stairs--gradually increasing  activities as tolerated.  Control your pain so that you can walk an hour a day.  If you can walk 30 minutes without difficulty, it is safe to try more intense activity such as jogging, treadmill, bicycling, low-impact aerobics, swimming, etc. Refrain from the most intensive and strenuous activity such as sit-ups, heavy lifting, contact sports, etc  Refrain from any heavy lifting or straining until 6 weeks after surgery.   DO NOT PUSH THROUGH PAIN.  Let pain be your guide: If it hurts to do something, don't do it.  Pain is your body warning you to avoid that activity for another week until the pain goes down. You may drive when you are no longer taking prescription pain medication, you can comfortably wear a seatbelt, and you can safely maneuver your car and apply brakes. You may have sexual intercourse when it is comfortable.   FOLLOW UP in our office Please call [facility] at [phone] to set up an appointment to see your surgeon in the office for a follow-up appointment approximately 2-3 weeks after your surgery. Make sure that you call for this appointment the day you arrive home to insure a convenient appointment time.  9.  If you have disability of FMLA / Family leave forms, please bring the forms to the office for processing.  (do not give to your surgeon).  WHEN TO CALL us [phone]: Poor pain control Reactions / problems with new medications (rash/itching, nausea, etc)  Fever over 101.5 F (38.5 C) Inability to urinate Nausea and/or vomiting Worsening swelling or bruising Continued bleeding from incision. Increased pain, redness, or drainage from the incision   The clinic staff is available to answer your questions during regular business hours (8:30am-5pm).  Please don't hesitate to call and ask to speak to one of our nurses for clinical concerns.   If you have a medical emergency, go to the nearest emergency room or call 911.  A surgeon from [facility] is always on  call at the hospitals in [facility], [place] [address], [address], [place], [place]  [address] ?  [address] [address], [place], [place]   [address] MAIN: [phone] ? TOLL FREE: [phone] ? FAX: [phone] [email or web address]

## 2022-10-28 NOTE — Transfer of Care (Signed)
Immediate Anesthesia Transfer of Care Note  Patient: Linda Richard  Procedure(s) Performed: XI ROBOTIC LEFT INGUINAL HERNIA REPAIR (Left)  Patient Location: PACU  Anesthesia Type:General  Level of Consciousness: awake, alert , oriented, and patient cooperative  Airway & Oxygen Therapy: Patient Spontanous Breathing and Patient connected to face mask oxygen  Post-op Assessment: Report given to RN, Post -op Vital signs reviewed and stable, and Patient moving all extremities  Post vital signs: Reviewed and stable  Last Vitals:  Vitals Value Taken Time  BP 137/68 10/28/22 1002  Temp    Pulse 78 10/28/22 1004  Resp 19 10/28/22 1004  SpO2 96 % 10/28/22 1004  Vitals shown include unvalidated device data.  Last Pain:  Vitals:   10/28/22 0628  TempSrc: Oral         Complications: No notable events documented.

## 2022-10-28 NOTE — Op Note (Signed)
Operative Note  Linda Richard  [id number]  [id number]  10/28/2022   Surgeon: Phylliss Blakes MD   Procedure performed: robotic (transabdominal preperitoneal) left inguinal hernia repair   Preop diagnosis: incarcerated left inguinal hernia Post-op diagnosis/intraop findings: same, direct containing medial umbilical ligament and preperitoneal fat   Specimens: no Retained items: no   EBL: 15cc Complications: none   Description of procedure: After confirming informed consent the patient was taken to the operating room and placed supine on the operating room table where general endotracheal anesthesia was initiated, preoperative antibiotics were administered, SCDs applied, foley inserted and a formal timeout was performed. The abdomen was prepped and draped in usual sterile fashion. Peritoneal access was gained with a periumbilical Veress needle and insufflation to 15 mmHg ensued without incident. 8mm robotic trocar and camera were inserted. The abdomen was inspected and confirmed to be free of any injury from our entry or gross abnormalities.  No significant intra-abdominal adhesions aside from a small omental band to her left lower quadrant port site from her recent appendectomy which was ultimately taken down.  The patient was then placed in Trendelenburg and the pelvis inspected.  There is a narrow necked direct inguinal hernia and a palpable mass in the patient's left groin consistent with a chronically incarcerated hernia containing preperitoneal fat.  Inspection of the right side is negative for any inguinal hernia or femoral hernia.  Under direct visualization, bilateral laparoscopic assisted tap blocks were performed with Exparel mixed with quarter percent Marcaine.  Bilateral 8 mm robotic trocars were inserted after infiltration with local taking care to avoid the large collateral subcutaneous veins within the patient's abdominal wall.  The robot was then docked and robotic instruments  inserted under direct visualization.   The peritoneal flap was developed using sharp and cautery dissection spanning from the anterior superior iliac spine to the medial umbilical ligament.  Her peritoneum is quite thin and attenuated.  Several very small collateral veins in the preperitoneal space were carefully addressed with cautery and hemostasis preserved as much as possible.  Lateral compartment developed without difficulty.  This patient Retzius was bluntly developed and the pubic symphysis exposed along with Cooper's ligament and about 2 cm inferior to this to expose the myopectineal orifice.  I carefully dissected the peritoneum off of the round ligament, electing not to divide the round ligament given that this structure was quite broad and in the history of pelvic venous congestion was wary of potentially recanalized vein within the structure.  The hernia sac was very difficult to reduce given the chronically incarcerated moderate amount of preperitoneal fat and medial umbilical ligament but after continued traction and sparing use of sharp dissection we were ultimately able to reduce the incarcerated structures and peritoneum.  This was somewhat thickened and so I elected to fill the bladder to perform a leak test.  The bladder was instilled with about 250 mL of methylene blue mixed with saline and confirmed to be intact with no evidence of leak or injury.  The bladder was then decompressed. At this juncture a Bard 3D max mid weight large mesh was inserted with excellent overlap of the direct defect as well as the indirect and femoral spaces.  This was secured to the Cooper's ligament as well as superiorly on either side of the inferior epigastric vessels with simple interrupted 3-0 Vicryls.  The peritoneal flap was then brought back up to cover the mesh, ensuring that the mesh remained flush against the abdominal wall and did  not fold or buckle inferiorly while doing so.  Peritoneum was closed with a  running imbricating 2 OV lock.  There were 2 small rents in the peritoneum which were closed with 2 OV lock.  Omentum was brought down over the bowel and the abdominal cavity was inspected once more and confirmed to be free of injury.  Hemostasis was confirmed.  All needles and instruments were removed and the robot was undocked. The skin incisions were closed with subcuticular 4-0 Monocryl and Dermabond.  The Foley catheter is removed.  The patient was then awakened, extubated and taken to PACU in stable condition.  All counts were correct at the completion of the case.

## 2022-10-28 NOTE — Anesthesia Postprocedure Evaluation (Signed)
Anesthesia Post Note  Patient: Linda Richard  Procedure(s) Performed: XI ROBOTIC LEFT INGUINAL HERNIA REPAIR (Left)     Patient location during evaluation: PACU Anesthesia Type: General Level of consciousness: awake and alert Pain management: pain level controlled Vital Signs Assessment: post-procedure vital signs reviewed and stable Respiratory status: spontaneous breathing, nonlabored ventilation, respiratory function stable and patient connected to nasal cannula oxygen Cardiovascular status: blood pressure returned to baseline and stable Postop Assessment: no apparent nausea or vomiting Anesthetic complications: no   No notable events documented.  Last Vitals:  Vitals:   10/28/22 1030 10/28/22 1045  BP: 133/75 130/77  Pulse: 70 77  Resp: 13 14  Temp:  36.6 C  SpO2: 95% 96%    Last Pain:  Vitals:   10/28/22 1045  TempSrc:   PainSc: 2                  Lovell Nuttall

## 2022-10-28 NOTE — Interval H&P Note (Signed)
History and Physical Interval Note:  10/28/2022 7:05 AM  Linda Richard  has presented today for surgery, with the diagnosis of INGUINAL HERNIA.  The various methods of treatment have been discussed with the patient and family. After consideration of risks, benefits and other options for treatment, the patient has consented to  Procedure(s): XI ROBOTIC LEFT POSSIBLE BILATERAL INGUINAL HERNIA (Left) as a surgical intervention.  The patient's history has been reviewed, patient examined, no change in status, stable for surgery.  I have reviewed the patient's chart and labs.  Questions were answered to the patient's satisfaction.     Casimir Barcellos Lollie Sails

## 2022-10-28 NOTE — Anesthesia Procedure Notes (Signed)
Procedure Name: Intubation Date/Time: 10/28/2022 7:33 AM  Performed by: Elisabeth Cara, CRNAPre-anesthesia Checklist: Patient identified, Emergency Drugs available, Suction available, Patient being monitored and Timeout performed Patient Re-evaluated:Patient Re-evaluated prior to induction Oxygen Delivery Method: Circle system utilized Preoxygenation: Pre-oxygenation with 100% oxygen Induction Type: IV induction Ventilation: Mask ventilation without difficulty Laryngoscope Size: Mac and 3 Grade View: Grade II Tube type: Oral Tube size: 7.5 mm Number of attempts: 2 Airway Equipment and Method: Stylet Placement Confirmation: ETT inserted through vocal cords under direct vision, positive ETCO2 and breath sounds checked- equal and bilateral Secured at: 24 cm Tube secured with: Tape Dental Injury: Teeth and Oropharynx as per pre-operative assessment  Comments: Smooth IV induction. Easy mask. DL X 2 by Reita Cliche, EMT student. Grade 2 view. ATOI. BBS=

## 2022-12-23 ENCOUNTER — Encounter: Payer: Self-pay | Admitting: Internal Medicine

## 2022-12-23 NOTE — Progress Notes (Unsigned)
No chief complaint on file.   HPI: Patient  Linda Richard  72 y.o. comes in today for Preventive Health Care visit   Left inginal repair  June Chornic  venous sinsufficienecy  HLD     Health Maintenance  Topic Date Due   Medicare Annual Wellness (AWV)  Never done   Hepatitis C Screening  Never done   COVID-19 Vaccine (4 - 2023-24 season) 01/18/2022   INFLUENZA VACCINE  12/19/2022   MAMMOGRAM  02/14/2023   Colonoscopy  09/29/2024   DTaP/Tdap/Td (3 - Td or Tdap) 04/03/2025   Pneumonia Vaccine 50+ Years old  Completed   DEXA SCAN  Completed   Zoster Vaccines- Shingrix  Completed   HPV VACCINES  Aged Out   Health Maintenance Review LIFESTYLE:  Exercise:   Tobacco/ETS: Alcohol:  Sugar beverages: Sleep: Drug use: no HH of  Work:    ROS:  GEN/ HEENT: No fever, significant weight changes sweats headaches vision problems hearing changes, CV/ PULM; No chest pain shortness of breath cough, syncope,edema  change in exercise tolerance. GI /GU: No adominal pain, vomiting, change in bowel habits. No blood in the stool. No significant GU symptoms. SKIN/HEME: ,no acute skin rashes suspicious lesions or bleeding. No lymphadenopathy, nodules, masses.  NEURO/ PSYCH:  No neurologic signs such as weakness numbness. No depression anxiety. IMM/ Allergy: No unusual infections.  Allergy .   REST of 12 system review negative except as per HPI   Past Medical History:  Diagnosis Date   Acute appendicitis 09/28/2022   Anxiety    takes Lexapro daily-just started 01/19/15   BACK PAIN 01/24/2009   Qualifier: Diagnosis of  By: Fabian Sharp MD, Neta Mends Uses inversion therapy and doing very well    Diverticulitis of intestine with perforation 10/06/2014   DVT (deep vein thrombosis) in pregnancy 1983   behind left leg   History of kidney stones 2011   History of shingles    Joint pain    Pneumonia as a child   Urinary frequency    Urinary urgency    just started taking Myrbetriq    Varicose veins    strip and laser    Past Surgical History:  Procedure Laterality Date   CESAREAN SECTION  1983   CHOLECYSTECTOMY N/A 01/30/2015   Procedure: LAPAROSCOPIC CHOLECYSTECTOMY;  Surgeon: Emelia Loron, MD;  Location: [facility];  Service: General;  Laterality: N/A;   COLONOSCOPY     LAPAROSCOPIC APPENDECTOMY N/A 09/29/2022   Procedure: APPENDECTOMY LAPAROSCOPIC;  Surgeon: Berna Bue, MD;  Location: [facility] OR;  Service: General;  Laterality: N/A;   OPEN REDUCTION INTERNAL FIXATION (ORIF) DISTAL RADIAL FRACTURE Left 12/04/2020   Procedure: OPEN TREATMENT OF DISPLACED LEFT DISTAL RADIUS FRACTURE;  Surgeon: Mack Hook, MD;  Location: [facility];  Service: Orthopedics;  Laterality: Left;   TONSILLECTOMY  as a child   veins stripped  80's   laser RX     Family History  Problem Relation Age of Onset   Hypertension Other    Kidney disease Other        renal stones   Arthritis Other    Cancer Father        bladder   57s    Ovarian cancer Mother        93s   Other Other        son age 25 irreg HB  had cardioversion    Thyroid disease Other    Colon polyps Neg Hx  Colon cancer Neg Hx    Rectal cancer Neg Hx    Stomach cancer Neg Hx     Social History   Socioeconomic History   Marital status: Married    Spouse name: Not on file   Number of children: Not on file   Years of education: Not on file   Highest education level: Bachelor's degree (e.g., BA, AB, BS)  Occupational History   Not on file  Tobacco Use   Smoking status: Never   Smokeless tobacco: Never  Vaping Use   Vaping status: Never Used  Substance and Sexual Activity   Alcohol use: Yes    Alcohol/week: 0.0 standard drinks of alcohol    Comment: 3 x a week, mostly wine no t daily]   Drug use: No   Sexual activity: Yes    Birth control/protection: Post-menopausal  Other Topics Concern   Not on file  Social History Narrative   HH of 2    Grandchild  Sons    reitred [employer] Child  Support   Married.    No  ets tobacco No  Exercise restriction   etoh 2 x per week.    Exercise good.       G3P2   Social Determinants of Health   Financial Resource Strain: Low Risk  (07/03/2022)   Overall Financial Resource Strain (CARDIA)    Difficulty of Paying Living Expenses: Not hard at all  Food Insecurity: No Food Insecurity (07/03/2022)   Hunger Vital Sign    Worried About Running Out of Food in the Last Year: Never true    Ran Out of Food in the Last Year: Never true  Transportation Needs: No Transportation Needs (07/03/2022)   PRAPARE - Administrator, Civil Service (Medical): No    Lack of Transportation (Non-Medical): No  Physical Activity: Sufficiently Active (07/03/2022)   Exercise Vital Sign    Days of Exercise per Week: 6 days    Minutes of Exercise per Session: 120 min  Stress: No Stress Concern Present (07/03/2022)   [employer] of Occupational Health - Occupational Stress Questionnaire    Feeling of Stress : Not at all  Social Connections: Unknown (07/03/2022)   Social Connection and Isolation Panel [NHANES]    Frequency of Communication with Friends and Family: More than three times a week    Frequency of Social Gatherings with Friends and Family: Once a week    Attends Religious Services: Patient declined    Database administrator or Organizations: No    Attends Engineer, structural: Not on file    Marital Status: Married    Outpatient Medications Prior to Visit  Medication Sig Dispense Refill   acetaminophen (TYLENOL) 500 MG tablet Take 2 tablets (1,000 mg total) by mouth every 8 (eight) hours as needed. 30 tablet 0   aspirin 81 MG tablet Take 81 mg by mouth daily.       busPIRone (BUSPAR) 7.5 MG tablet Take 7.5 mg by mouth 2 (two) times daily.     Calcium Carb-Cholecalciferol (CALTRATE 600+D3 PO) Take 1 tablet by mouth in the morning.     escitalopram (LEXAPRO) 10 MG tablet Take 10 mg by mouth in the morning.     estradiol  (ESTRACE) 0.1 MG/GM vaginal cream Place 1 g vaginally 2 (two) times a week.     methocarbamol (ROBAXIN) 500 MG tablet Take 1 tablet (500 mg total) by mouth every 6 (six) hours as needed for muscle spasms. (Patient  not taking: Reported on 10/17/2022) 30 tablet 0   Multiple Vitamins-Minerals (ONE DAILY WOMENS 50+ PO) Take 1 tablet by mouth in the morning.     ondansetron (ZOFRAN-ODT) 4 MG disintegrating tablet Take 1 tablet (4 mg total) by mouth every 8 (eight) hours as needed for nausea or vomiting. (Patient not taking: Reported on 10/17/2022) 20 tablet 0   Polyvinyl Alcohol-Povidone (REFRESH OP) Place 1 drop into both eyes 2 (two) times daily as needed (dry eye).     Probiotic Product (PROBIOTIC DAILY PO) Take 1 capsule by mouth in the morning. align     traMADol (ULTRAM) 50 MG tablet Take 1 tablet (50 mg total) by mouth every 6 (six) hours as needed for severe pain. (Patient not taking: Reported on 10/17/2022) 15 tablet 0   tretinoin (RETIN-A) 0.025 % cream Apply 1 Application topically at bedtime.  99   No facility-administered medications prior to visit.     EXAM:  There were no vitals taken for this visit.  There is no height or weight on file to calculate BMI. Wt Readings from Last 3 Encounters:  10/28/22 125 lb (56.7 kg)  10/22/22 125 lb (56.7 kg)  09/28/22 128 lb 15.5 oz (58.5 kg)    Physical Exam: Vital signs reviewed WGN:FAOZ is a well-developed well-nourished alert cooperative    who appearsr stated age in no acute distress.  HEENT: normocephalic atraumatic , Eyes: PERRL EOM's full, conjunctiva clear, Nares: paten,t no deformity discharge or tenderness., Ears: no deformity EAC's clear TMs with normal landmarks. Mouth: clear OP, no lesions, edema.  Moist mucous membranes. Dentition in adequate repair. NECK: supple without masses, thyromegaly or bruits. CHEST/PULM:  Clear to auscultation and percussion breath sounds equal no wheeze , rales or rhonchi. No chest wall deformities or  tenderness. Breast: normal by inspection . No dimpling, discharge, masses, tenderness or discharge . CV: PMI is nondisplaced, S1 S2 no gallops, murmurs, rubs. Peripheral pulses are full without delay.No JVD .  ABDOMEN: Bowel sounds normal nontender  No guard or rebound, no hepato splenomegal no CVA tenderness.  No hernia. Extremtities:  No clubbing cyanosis or edema, no acute joint swelling or redness no focal atrophy NEURO:  Oriented x3, cranial nerves 3-12 appear to be intact, no obvious focal weakness,gait within normal limits no abnormal reflexes or asymmetrical SKIN: No acute rashes normal turgor, color, no bruising or petechiae. PSYCH: Oriented, good eye contact, no obvious depression anxiety, cognition and judgment appear normal. LN: no cervical axillary inguinal adenopathy  Lab Results  Component Value Date   WBC 7.0 10/22/2022   HGB 14.0 10/22/2022   HCT 43.0 10/22/2022   PLT 237 10/22/2022   GLUCOSE 101 (H) 10/22/2022   CHOL 251 (H) 12/19/2021   TRIG 62.0 12/19/2021   HDL 100.30 12/19/2021   LDLDIRECT 128.8 02/23/2013   LDLCALC 139 (H) 12/19/2021   ALT 43 09/28/2022   AST 54 (H) 09/28/2022   NA 137 10/22/2022   K 4.7 10/22/2022   CL 101 10/22/2022   CREATININE 0.41 (L) 10/22/2022   BUN 14 10/22/2022   CO2 27 10/22/2022   TSH 0.87 12/13/2020   HGBA1C 5.3 11/13/2018   MICROALBUR <0.7 11/16/2019    BP Readings from Last 3 Encounters:  10/28/22 130/77  10/22/22 137/75  09/30/22 (!) 118/58    Lab results reviewed with patient   ASSESSMENT AND PLAN:  Discussed the following assessment and plan:    ICD-10-CM   1. Visit for preventive health examination  Z00.00  2. Elevated SGOT (AST)  R74.01     3. Hyperlipidemia, unspecified hyperlipidemia type  E78.5      No follow-ups on file.  Patient Care Team: Roderick Calo, Neta Mends, MD as PCP - General Olivia Mackie, MD (Obstetrics and Gynecology) Loletha Carrow, MD as Consulting Physician  (Ophthalmology) Elesa Hacker, MD as Referring Physician (Dermatology) Lynden Ang, NP as Nurse Practitioner (Obstetrics and Gynecology) There are no Patient Instructions on file for this visit.  Neta Mends. Tara Wich M.D.

## 2022-12-24 ENCOUNTER — Ambulatory Visit: Payer: Medicare Other | Admitting: Internal Medicine

## 2022-12-24 ENCOUNTER — Encounter: Payer: Self-pay | Admitting: Internal Medicine

## 2022-12-24 VITALS — BP 130/80 | HR 71 | Temp 98.1°F | Ht 67.8 in | Wt 127.6 lb

## 2022-12-24 DIAGNOSIS — Z8249 Family history of ischemic heart disease and other diseases of the circulatory system: Secondary | ICD-10-CM | POA: Diagnosis not present

## 2022-12-24 DIAGNOSIS — Z0000 Encounter for general adult medical examination without abnormal findings: Secondary | ICD-10-CM

## 2022-12-24 DIAGNOSIS — R7401 Elevation of levels of liver transaminase levels: Secondary | ICD-10-CM | POA: Diagnosis not present

## 2022-12-24 DIAGNOSIS — E785 Hyperlipidemia, unspecified: Secondary | ICD-10-CM

## 2022-12-24 DIAGNOSIS — Z79899 Other long term (current) drug therapy: Secondary | ICD-10-CM

## 2022-12-24 NOTE — Patient Instructions (Signed)
Good to see you today . Plan fasting lab appt  Continue lifestyle intervention healthy eating and exercise .

## 2023-01-02 ENCOUNTER — Other Ambulatory Visit (INDEPENDENT_AMBULATORY_CARE_PROVIDER_SITE_OTHER): Payer: Medicare Other

## 2023-01-02 DIAGNOSIS — Z79899 Other long term (current) drug therapy: Secondary | ICD-10-CM

## 2023-01-02 DIAGNOSIS — E785 Hyperlipidemia, unspecified: Secondary | ICD-10-CM | POA: Diagnosis not present

## 2023-01-02 DIAGNOSIS — R7401 Elevation of levels of liver transaminase levels: Secondary | ICD-10-CM

## 2023-01-02 LAB — CBC WITH DIFFERENTIAL/PLATELET
Basophils Absolute: 0 10*3/uL (ref 0.0–0.1)
Basophils Relative: 0.7 % (ref 0.0–3.0)
Eosinophils Absolute: 0.3 10*3/uL (ref 0.0–0.7)
Eosinophils Relative: 5.7 % — ABNORMAL HIGH (ref 0.0–5.0)
HCT: 42.3 % (ref 36.0–46.0)
Hemoglobin: 14 g/dL (ref 12.0–15.0)
Lymphocytes Relative: 27.1 % (ref 12.0–46.0)
Lymphs Abs: 1.3 10*3/uL (ref 0.7–4.0)
MCHC: 33 g/dL (ref 30.0–36.0)
MCV: 93.1 fl (ref 78.0–100.0)
Monocytes Absolute: 0.4 10*3/uL (ref 0.1–1.0)
Monocytes Relative: 9.3 % (ref 3.0–12.0)
Neutro Abs: 2.7 10*3/uL (ref 1.4–7.7)
Neutrophils Relative %: 57.2 % (ref 43.0–77.0)
Platelets: 189 10*3/uL (ref 150.0–400.0)
RBC: 4.54 Mil/uL (ref 3.87–5.11)
RDW: 13.2 % (ref 11.5–15.5)
WBC: 4.6 10*3/uL (ref 4.0–10.5)

## 2023-01-02 LAB — HEPATIC FUNCTION PANEL
ALT: 18 U/L (ref 0–35)
AST: 21 U/L (ref 0–37)
Albumin: 4 g/dL (ref 3.5–5.2)
Alkaline Phosphatase: 53 U/L (ref 39–117)
Bilirubin, Direct: 0.2 mg/dL (ref 0.0–0.3)
Total Bilirubin: 0.9 mg/dL (ref 0.2–1.2)
Total Protein: 7 g/dL (ref 6.0–8.3)

## 2023-01-02 LAB — LIPID PANEL
Cholesterol: 214 mg/dL — ABNORMAL HIGH (ref 0–200)
HDL: 86.3 mg/dL (ref >39.00)
LDL Cholesterol: 115 mg/dL — ABNORMAL HIGH (ref 0–99)
NonHDL: 127.68
Total CHOL/HDL Ratio: 2
Triglycerides: 61 mg/dL (ref 0.0–149.0)
VLDL: 12.2 mg/dL (ref 0.0–40.0)

## 2023-01-02 LAB — BASIC METABOLIC PANEL
BUN: 13 mg/dL (ref 6–23)
CO2: 31 mEq/L (ref 19–32)
Calcium: 9.4 mg/dL (ref 8.4–10.5)
Chloride: 101 mEq/L (ref 96–112)
Creatinine, Ser: 0.48 mg/dL (ref 0.40–1.20)
GFR: 96.07 mL/min (ref >60.00)
Glucose, Bld: 91 mg/dL (ref 70–99)
Potassium: 3.6 mEq/L (ref 3.5–5.1)
Sodium: 137 mEq/L (ref 135–145)

## 2023-01-02 LAB — TSH: TSH: 1.33 u[IU]/mL (ref 0.35–5.50)

## 2023-01-08 LAB — LIPOPROTEIN A (LPA): Lipoprotein (a): 176 nmol/L — ABNORMAL HIGH (ref <75)

## 2023-01-08 LAB — HEPATITIS C ANTIBODY: Hepatitis C Ab: NONREACTIVE

## 2023-01-08 LAB — HEPATITIS B SURFACE ANTIGEN: Hepatitis B Surface Ag: NONREACTIVE

## 2023-01-08 LAB — HEPATITIS B CORE ANTIBODY, TOTAL: Hep B Core Total Ab: NONREACTIVE

## 2023-01-08 LAB — HEPATITIS B SURFACE ANTIBODY,QUALITATIVE: Hep B S Ab: NONREACTIVE

## 2023-02-07 IMAGING — XA DG WRIST COMPLETE 3+V*L*
1 series · 3 of 3 positions shown · non-contrast
Comparison: 11/25/2020

CLINICAL DATA: Left wrist ORIF

EXAM:
LEFT WRIST - COMPLETE 3+ VIEW; DG C-ARM 1-60 MIN

[Series 1: unknown protocol · 0.30mm/px · 3 of 3 slices shown]
[im 1/3]
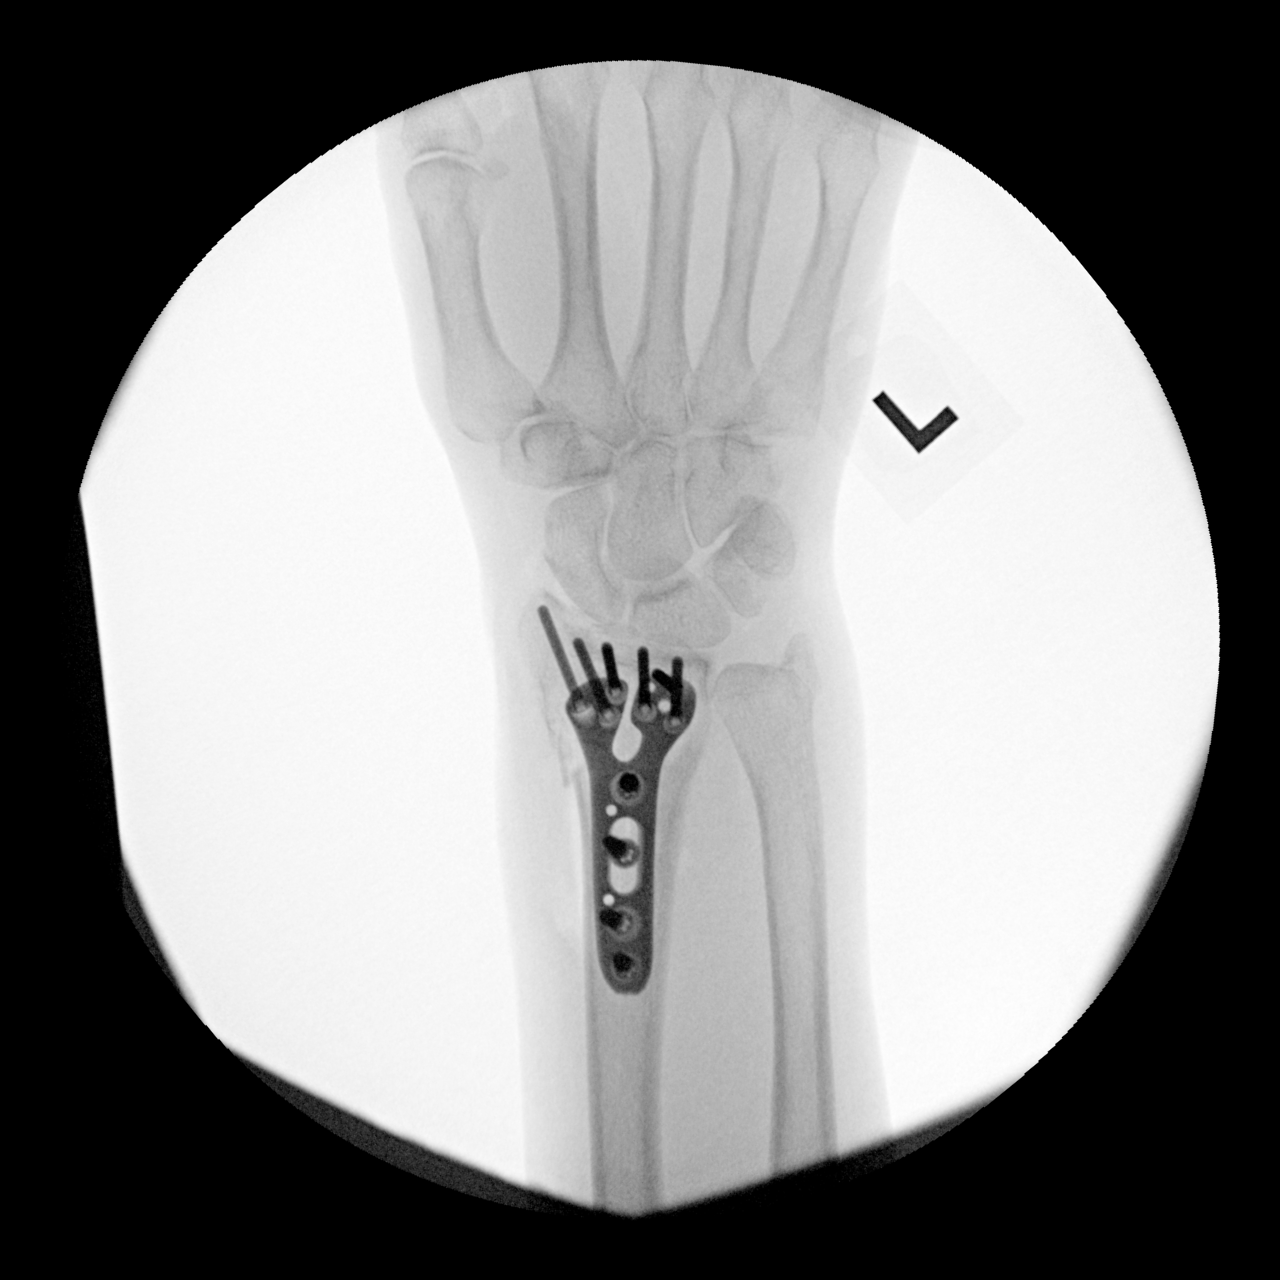
[im 2/3]
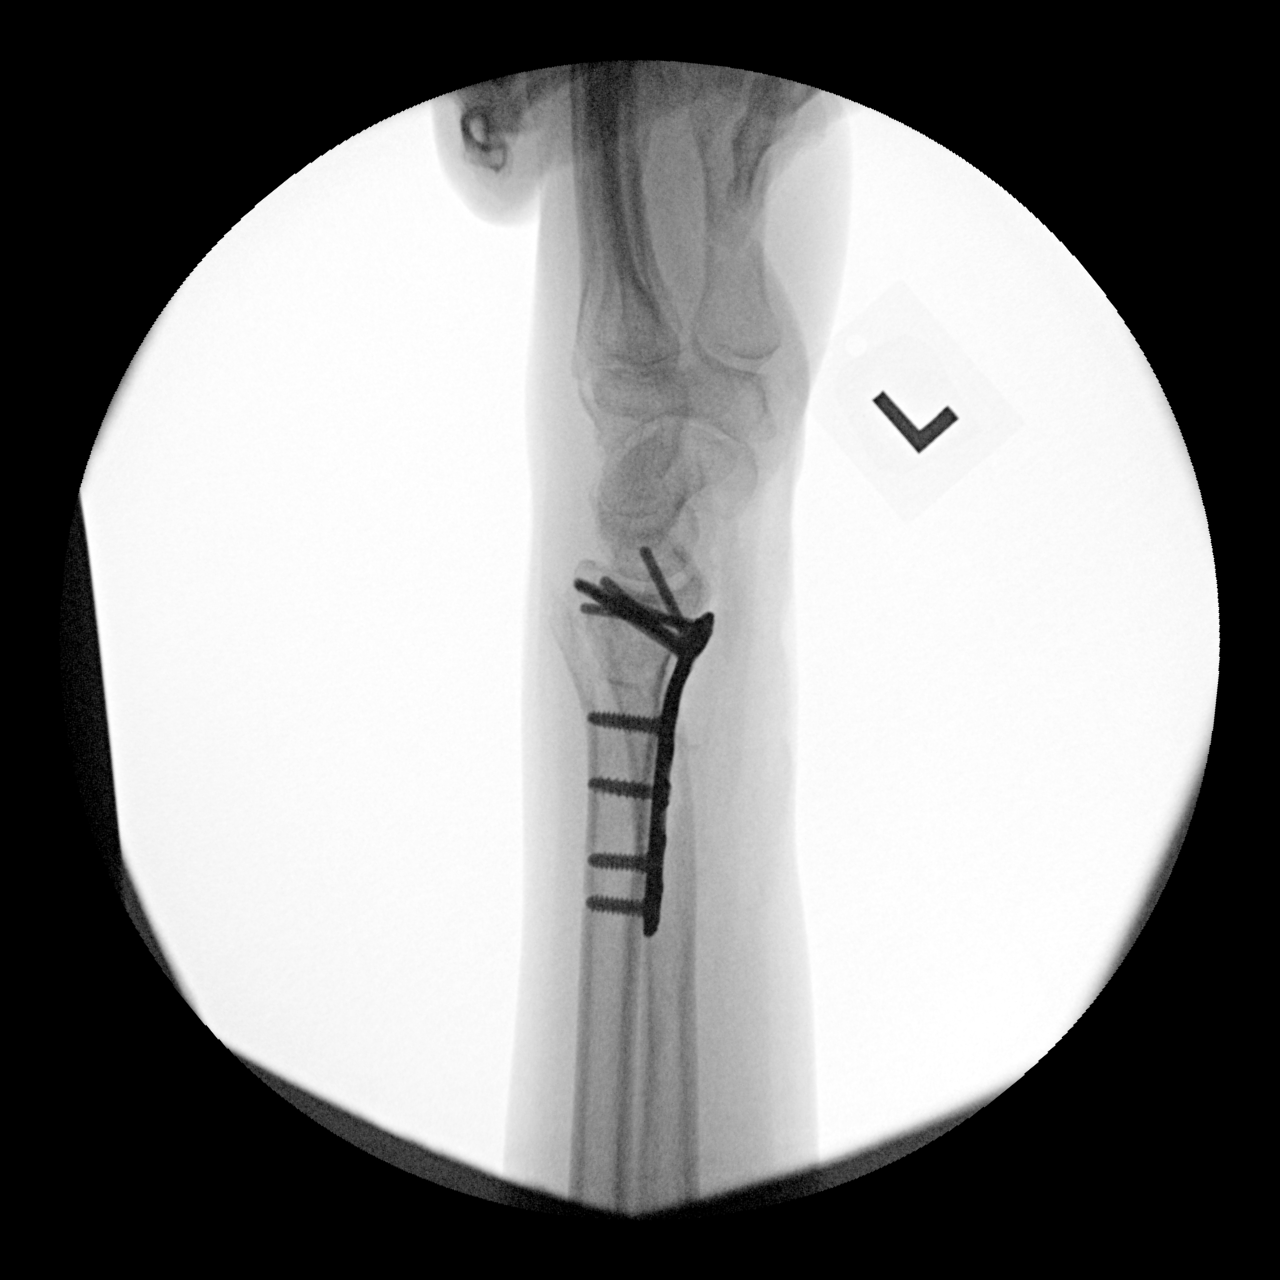
[im 3/3]
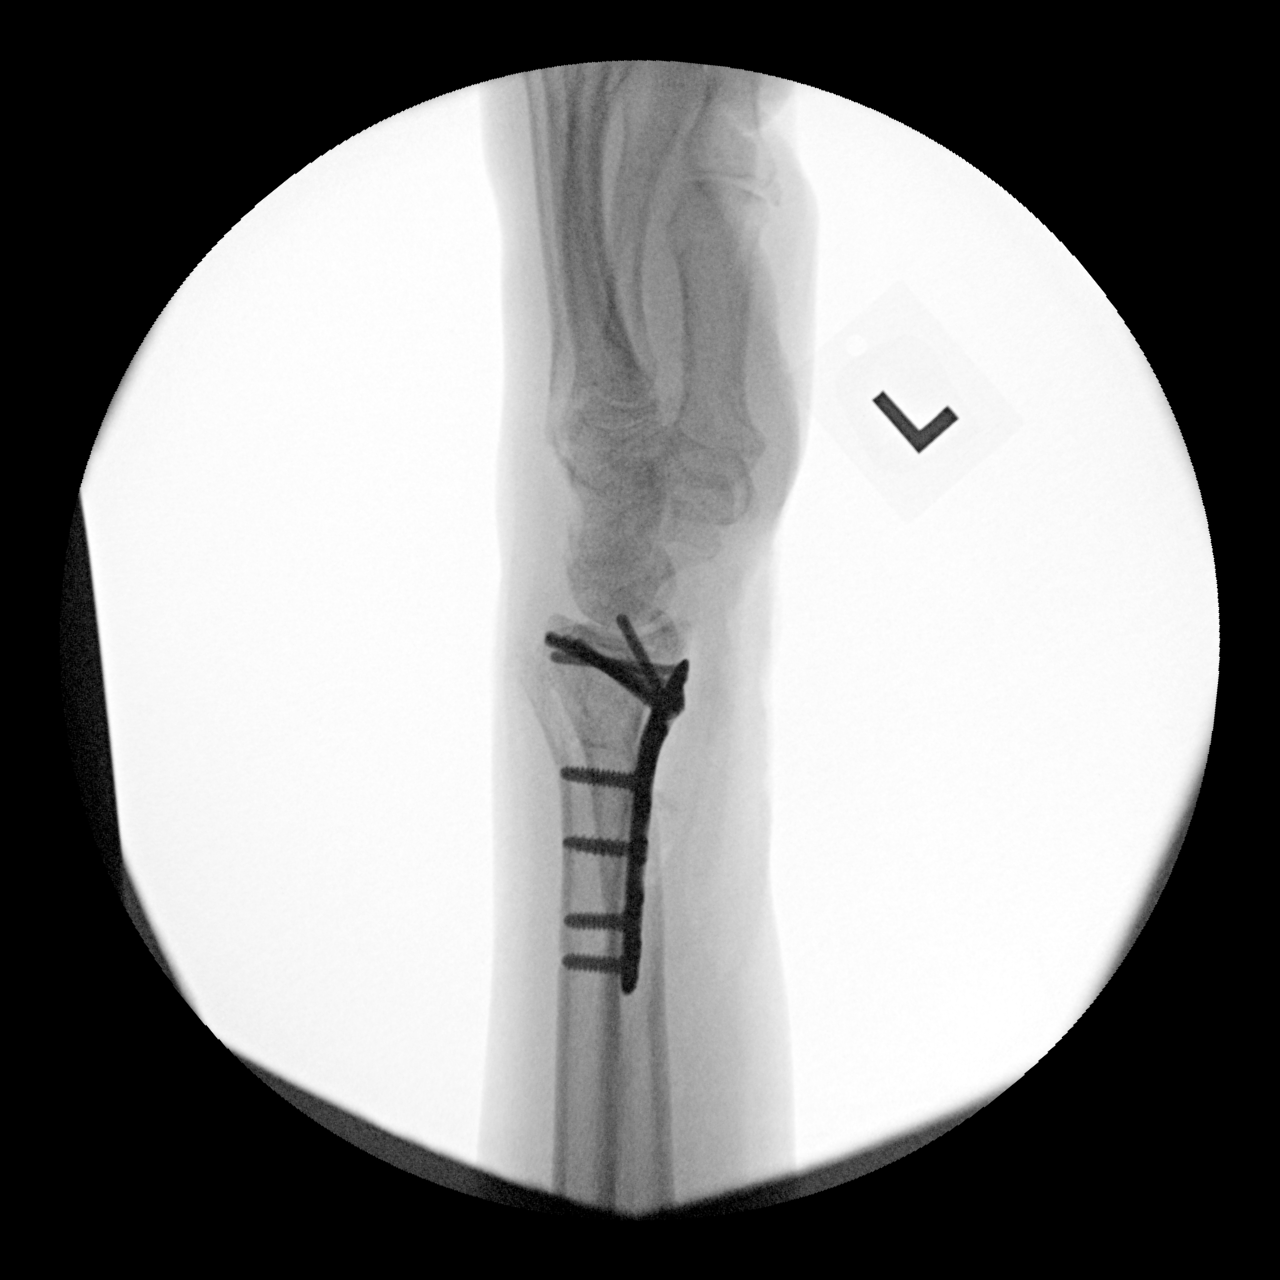

[3 of 3 positions shown; findings below may reference images not displayed]

FINDINGS: Three fluoroscopic intraoperative radiographs of the left wrist
demonstrate ORIF of a comminuted distal left radial fracture with a
volar plate and multiple screws. Fracture fragments are in grossly
anatomic alignment on this limited examination. No unexpected
fracture or dislocation identified.

Fluoroscopy time: 27 seconds

Fluoroscopic dose: 0.62 mGy

Images: 3
IMPRESSION: Left wrist ORIF as described above.

## 2023-02-10 NOTE — Progress Notes (Signed)
Blood results are pretty good  except your lipo a is in the high range which is a hereditary risk for vascular  disease  ie future heart and stroke.   You could consider beginning statin medication   or get a ct calcium score ( 99$ self pay)  which identifies plaque build up that show risk If these abnormal clinical findings persist, appropriate workup will be completed.    If  Zero    continue  but if elevated  consider adding  medication . Let me know thoughts.   (Apologies for late review  was waiting for the lipo a score  )

## 2023-02-18 ENCOUNTER — Encounter: Payer: Self-pay | Admitting: Internal Medicine

## 2023-02-18 DIAGNOSIS — E785 Hyperlipidemia, unspecified: Secondary | ICD-10-CM

## 2023-02-18 DIAGNOSIS — Z79899 Other long term (current) drug therapy: Secondary | ICD-10-CM

## 2023-02-19 NOTE — Telephone Encounter (Signed)
No rush ..lipoprotein A is an hereditary factor and NOT related to  lifestyle  . So doesn't really change that much  . Please order her ct calcium score and have her be  involved in dates

## 2023-02-20 NOTE — Telephone Encounter (Signed)
Spoke to Linda Richard and inform her of message below.    Order is placed and advised Linda Richard someone will give her a call about scheduling an appt. Verbalized understanding.

## 2023-02-25 ENCOUNTER — Ambulatory Visit (HOSPITAL_COMMUNITY)
Admission: RE | Admit: 2023-02-25 | Discharge: 2023-02-25 | Disposition: A | Discharge status: Home / Self Care | Payer: Self-pay | Source: Ambulatory Visit | Attending: Internal Medicine | Admitting: Internal Medicine

## 2023-02-25 DIAGNOSIS — E785 Hyperlipidemia, unspecified: Secondary | ICD-10-CM | POA: Insufficient documentation

## 2023-03-13 NOTE — Progress Notes (Signed)
Ct calcium score is zero which is lower risk future cardiac events . Reassuring  favorable . So Continue lifestyle intervention healthy eating and exercise .   There is a system delay in getting radiology over read but will review when available.

## 2023-03-17 NOTE — Progress Notes (Signed)
X ray over read   some changes in right middle  lobe felt to be "mild"  uncertain significance . Can be seen in infection or  recovering  respiratory infection.   If you have a cough or problem with breathing then make Appt . If not then I would  have you gt a plain chest x ray in 3-4 weeks.

## 2023-05-18 ENCOUNTER — Encounter (HOSPITAL_COMMUNITY): Payer: Self-pay

## 2023-05-18 ENCOUNTER — Other Ambulatory Visit: Payer: Self-pay

## 2023-05-18 ENCOUNTER — Emergency Department (HOSPITAL_COMMUNITY)
Admission: EM | Admit: 2023-05-18 | Discharge: 2023-05-18 | Disposition: A | Discharge status: Home / Self Care | Payer: Medicare Other | Attending: Emergency Medicine | Admitting: Emergency Medicine

## 2023-05-18 ENCOUNTER — Emergency Department (HOSPITAL_COMMUNITY): Payer: Medicare Other

## 2023-05-18 DIAGNOSIS — W19XXXA Unspecified fall, initial encounter: Principal | ICD-10-CM

## 2023-05-18 DIAGNOSIS — Z79899 Other long term (current) drug therapy: Secondary | ICD-10-CM | POA: Insufficient documentation

## 2023-05-18 DIAGNOSIS — Y929 Unspecified place or not applicable: Secondary | ICD-10-CM | POA: Insufficient documentation

## 2023-05-18 DIAGNOSIS — S52181A Other fracture of upper end of right radius, initial encounter for closed fracture: Secondary | ICD-10-CM | POA: Diagnosis not present

## 2023-05-18 DIAGNOSIS — Y9301 Activity, walking, marching and hiking: Secondary | ICD-10-CM | POA: Insufficient documentation

## 2023-05-18 DIAGNOSIS — S52301A Unspecified fracture of shaft of right radius, initial encounter for closed fracture: Secondary | ICD-10-CM

## 2023-05-18 DIAGNOSIS — S50811A Abrasion of right forearm, initial encounter: Secondary | ICD-10-CM | POA: Insufficient documentation

## 2023-05-18 DIAGNOSIS — W1800XA Striking against unspecified object with subsequent fall, initial encounter: Secondary | ICD-10-CM | POA: Insufficient documentation

## 2023-05-18 DIAGNOSIS — Y999 Unspecified external cause status: Secondary | ICD-10-CM | POA: Insufficient documentation

## 2023-05-18 DIAGNOSIS — Z7982 Long term (current) use of aspirin: Secondary | ICD-10-CM | POA: Insufficient documentation

## 2023-05-18 DIAGNOSIS — S5290XA Unspecified fracture of unspecified forearm, initial encounter for closed fracture: Secondary | ICD-10-CM | POA: Diagnosis present

## 2023-05-18 DIAGNOSIS — M79601 Pain in right arm: Secondary | ICD-10-CM | POA: Diagnosis present

## 2023-05-18 DIAGNOSIS — Z23 Encounter for immunization: Secondary | ICD-10-CM | POA: Diagnosis not present

## 2023-05-18 LAB — BASIC METABOLIC PANEL
Anion gap: 7 (ref 5–15)
BUN: 12 mg/dL (ref 8–23)
CO2: 26 mmol/L (ref 22–32)
Calcium: 9.2 mg/dL (ref 8.9–10.3)
Chloride: 102 mmol/L (ref 98–111)
Creatinine, Ser: 0.53 mg/dL (ref 0.44–1.00)
GFR, Estimated: >60 mL/min (ref >60)
Glucose, Bld: 101 mg/dL — ABNORMAL HIGH (ref 70–99)
Potassium: 3.8 mmol/L (ref 3.5–5.1)
Sodium: 135 mmol/L (ref 135–145)

## 2023-05-18 LAB — CBC WITH DIFFERENTIAL/PLATELET
Abs Immature Granulocytes: 0.04 10*3/uL (ref 0.00–0.07)
Basophils Absolute: 0 10*3/uL (ref 0.0–0.1)
Basophils Relative: 0 %
Eosinophils Absolute: 0 10*3/uL (ref 0.0–0.5)
Eosinophils Relative: 0 %
HCT: 41.2 % (ref 36.0–46.0)
Hemoglobin: 13.8 g/dL (ref 12.0–15.0)
Immature Granulocytes: 0 %
Lymphocytes Relative: 20 %
Lymphs Abs: 1.9 10*3/uL (ref 0.7–4.0)
MCH: 31.1 pg (ref 26.0–34.0)
MCHC: 33.5 g/dL (ref 30.0–36.0)
MCV: 92.8 fL (ref 80.0–100.0)
Monocytes Absolute: 0.8 10*3/uL (ref 0.1–1.0)
Monocytes Relative: 9 %
Neutro Abs: 6.8 10*3/uL (ref 1.7–7.7)
Neutrophils Relative %: 71 %
Platelets: 186 10*3/uL (ref 150–400)
RBC: 4.44 MIL/uL (ref 3.87–5.11)
RDW: 12.6 % (ref 11.5–15.5)
WBC: 9.7 10*3/uL (ref 4.0–10.5)
nRBC: 0 % (ref 0.0–0.2)

## 2023-05-18 MED ORDER — OXYCODONE-ACETAMINOPHEN 5-325 MG PO TABS
1.0000 | ORAL_TABLET | Freq: Once | ORAL | Status: AC
  Administered 2023-05-18: 1 via ORAL
  Filled 2023-05-18: qty 1

## 2023-05-18 MED ORDER — CEPHALEXIN 250 MG PO CAPS
500.0000 mg | ORAL_CAPSULE | Freq: Once | ORAL | Status: AC
  Administered 2023-05-18: 500 mg via ORAL
  Filled 2023-05-18: qty 2

## 2023-05-18 MED ORDER — OXYCODONE HCL 5 MG PO TABS
5.0000 mg | ORAL_TABLET | Freq: Once | ORAL | Status: DC
  Filled 2023-05-18: qty 1

## 2023-05-18 MED ORDER — OXYCODONE-ACETAMINOPHEN 5-325 MG PO TABS: 1.0000 | ORAL_TABLET | ORAL | 0 refills | Status: DC | PRN

## 2023-05-18 MED ORDER — IBUPROFEN 800 MG PO TABS
800.0000 mg | ORAL_TABLET | Freq: Once | ORAL | Status: AC
  Administered 2023-05-18: 800 mg via ORAL
  Filled 2023-05-18: qty 1

## 2023-05-18 MED ORDER — TETANUS-DIPHTH-ACELL PERTUSSIS 5-2.5-18.5 LF-MCG/0.5 IM SUSY
0.5000 mL | PREFILLED_SYRINGE | Freq: Once | INTRAMUSCULAR | Status: AC
  Administered 2023-05-18: 0.5 mL via INTRAMUSCULAR
  Filled 2023-05-18: qty 0.5

## 2023-05-18 NOTE — Discharge Instructions (Addendum)
You have been seen here in the emergency department for arm fracture. We have obtained a full history, performed a physical exam, in addition to other diagnostic tests and treatments. Right now, we feel that you are safe for discharge from a medical perspective, and do not have an acute life threatening illness.   To do: 1.) Take all medications as prescribed.   2.) If anything changes, or you develop fevers, chills, inability to eat or drink, severe pain, new symptoms, return of symptoms, worsening of symptoms, or any other concerns, please call 911 or come back to the emergency department as soon as possible.   3.) Please make an appointment with your primary care doctor for a follow-up visit after being seen here in the emergency department.    Thank you for allowing me to take care of you today. We hope that you feel better soon.

## 2023-05-18 NOTE — Consult Note (Signed)
ORTHOPAEDIC CONSULTATION  REQUESTING PHYSICIAN: No att. providers found  Chief Complaint: Right both bone forearm fracture  HPI: Linda Richard is a 72 y.o. female with right forearm pain and deformity after a tree branch fell on her arm while hiking. She had immediate pain and deformity and presented to the ED. She denies other injuries. She denies numbness or tingling.  Past Medical History:  Diagnosis Date   Acute appendicitis 09/28/2022   Anxiety    takes Lexapro daily-just started 01/19/15   BACK PAIN 01/24/2009   Qualifier: Diagnosis of  By: Fabian Sharp MD, Neta Mends Uses inversion therapy and doing very well    Diverticulitis of intestine with perforation 10/06/2014   DVT (deep vein thrombosis) in pregnancy 1983   behind left leg   History of kidney stones 2011   History of shingles    Joint pain    Pneumonia as a child   Urinary frequency    Urinary urgency    just started taking Myrbetriq   Varicose veins    strip and laser   Past Surgical History:  Procedure Laterality Date   CESAREAN SECTION  1983   CHOLECYSTECTOMY N/A 01/30/2015   Procedure: LAPAROSCOPIC CHOLECYSTECTOMY;  Surgeon: Emelia Loron, MD;  Location: [facility];  Service: General;  Laterality: N/A;   COLONOSCOPY     LAPAROSCOPIC APPENDECTOMY N/A 09/29/2022   Procedure: APPENDECTOMY LAPAROSCOPIC;  Surgeon: Berna Bue, MD;  Location: [facility];  Service: General;  Laterality: N/A;   OPEN REDUCTION INTERNAL FIXATION (ORIF) DISTAL RADIAL FRACTURE Left 12/04/2020   Procedure: OPEN TREATMENT OF DISPLACED LEFT DISTAL RADIUS FRACTURE;  Surgeon: Mack Hook, MD;  Location: [facility];  Service: Orthopedics;  Laterality: Left;   TONSILLECTOMY  as a child   veins stripped  80's   laser RX    Social History   Socioeconomic History   Marital status: Married    Spouse name: Not on file   Number of children: Not on file   Years of education: Not on file   Highest education level: Bachelor's degree  (e.g., BA, AB, BS)  Occupational History   Not on file  Tobacco Use   Smoking status: Never   Smokeless tobacco: Never  Vaping Use   Vaping status: Never Used  Substance and Sexual Activity   Alcohol use: Yes    Alcohol/week: 0.0 standard drinks of alcohol    Comment: 3 x a week, mostly wine no t daily]   Drug use: No   Sexual activity: Yes    Birth control/protection: Post-menopausal  Other Topics Concern   Not on file  Social History Narrative   HH of 2    Grandchild  Sons    reitred [employer] Child Support   Married.    No  ets tobacco No  Exercise restriction   etoh 2 x per week.    Exercise good.       G3P2   Social Drivers of Corporate investment banker Strain: Low Risk  (07/03/2022)   Overall Financial Resource Strain (CARDIA)    Difficulty of Paying Living Expenses: Not hard at all  Food Insecurity: No Food Insecurity (07/03/2022)   Hunger Vital Sign    Worried About Running Out of Food in the Last Year: Never true    Ran Out of Food in the Last Year: Never true  Transportation Needs: No Transportation Needs (07/03/2022)   PRAPARE - Administrator, Civil Service (Medical): No  Lack of Transportation (Non-Medical): No  Physical Activity: Sufficiently Active (12/24/2022)   Exercise Vital Sign    Days of Exercise per Week: 6 days    Minutes of Exercise per Session: 90 min  Stress: No Stress Concern Present (12/24/2022)   [employer] of Occupational Health - Occupational Stress Questionnaire    Feeling of Stress : Not at all  Social Connections: Moderately Isolated (12/24/2022)   Social Connection and Isolation Panel [NHANES]    Frequency of Communication with Friends and Family: More than three times a week    Frequency of Social Gatherings with Friends and Family: More than three times a week    Attends Religious Services: Never    Database administrator or Organizations: No    Attends Engineer, structural: Never    Marital Status: Married    Family History  Problem Relation Age of Onset   Hypertension Other    Kidney disease Other        renal stones   Arthritis Other    Cancer Father        bladder   46s    Ovarian cancer Mother        24s   Other Other        son age 64 irreg HB  had cardioversion    Thyroid disease Other    Colon polyps Neg Hx    Colon cancer Neg Hx    Rectal cancer Neg Hx    Stomach cancer Neg Hx    No Known Allergies Prior to Admission medications   Medication Sig Start Date End Date Taking? Authorizing Provider  oxyCODONE-acetaminophen (PERCOCET) 5-325 MG tablet Take 1 tablet by mouth every 4 (four) hours as needed. 05/18/23  Yes Charlynne Pander, MD  acetaminophen (TYLENOL) 500 MG tablet Take 2 tablets (1,000 mg total) by mouth every 8 (eight) hours as needed. 09/30/22   Maczis, Elmer Sow, PA-C  aspirin 81 MG tablet Take 81 mg by mouth daily.      [provider]  busPIRone (BUSPAR) 7.5 MG tablet Take 7.5 mg by mouth 2 (two) times daily. 05/10/16   Olivia Mackie, MD  Calcium Carb-Cholecalciferol (CALTRATE 600+D3 PO) Take 1 tablet by mouth in the morning.    [provider]  escitalopram (LEXAPRO) 10 MG tablet Take 10 mg by mouth in the morning. 05/10/16   Olivia Mackie, MD  estradiol (ESTRACE) 0.1 MG/GM vaginal cream Place 1 g vaginally 2 (two) times a week.    [provider]  Multiple Vitamins-Minerals (ONE DAILY WOMENS 50+ PO) Take 1 tablet by mouth in the morning.    [provider]  Polyvinyl Alcohol-Povidone (REFRESH OP) Place 1 drop into both eyes 2 (two) times daily as needed (dry eye).    [provider]  Probiotic Product (PROBIOTIC DAILY PO) Take 1 capsule by mouth in the morning. align    [provider]  tretinoin (RETIN-A) 0.025 % cream Apply 1 Application topically at bedtime. 07/05/17   [provider]    Family History Reviewed and non-contributory, no pertinent history of problems with bleeding or  anesthesia      Review of Systems 14 system ROS conducted and negative except for that noted in HPI   OBJECTIVE  Vitals:Patient Vitals for the past 8 hrs:  BP Pulse Resp SpO2  05/18/23 2230 138/76 82 18 99 %  05/18/23 2045 (!) 134/58 67 17 100 %  05/18/23 2030 118/61 64 -- 99 %  05/18/23 1702 (!) 141/69 71 16 97 %   General: Alert, no acute distress Cardiovascular: Warm extremities noted Respiratory: No cyanosis, no use of accessory musculature GI: No organomegaly, abdomen is soft and non-tender Skin: No lesions in the area of chief complaint other than those listed below in MSK exam.  Neurologic: Sensation intact distally save for the below mentioned MSK exam Psychiatric: Patient is competent for consent with normal mood and affect Lymphatic: No swelling obvious and reported other than the area involved in the exam below Extremities  RUE: Abrasion over dorsal forearm, no active bleeding or open wounds Swelling and obvious deformity of mid forearm TTP forearm with palpable crepitus Motor intact AIN/PIN/U Sensation intact M/R/U Compartments soft and compressible No pain with passive movement of digits 2+ radial pulse  Test Results Imaging DG Forearm Right Result Date: 05/18/2023 CLINICAL DATA:  Right forearm deformity after tree branch fell on it. EXAM: RIGHT FOREARM - 2 VIEW COMPARISON:  None Available. FINDINGS: Severely displaced fractures are seen involving the mid to distal shafts of the right radius and ulna with overlying fracture fragments. IMPRESSION: Severely displaced right radial and ulnar fractures as noted above. Electronically Signed   By: Lupita Raider M.D.   On: 05/18/2023 15:15   Labs cbc Recent Labs    05/18/23 2100  WBC 9.7  HGB 13.8  HCT 41.2  PLT 186    Labs inflam No results for input(s): "CRP" in the last 72 hours.  Invalid input(s): "ESR"  Labs coag No results for input(s): "INR", "PTT" in the last 72 hours.  Invalid input(s):  "PT"  Recent Labs    05/18/23 2100  NA 135  K 3.8  CL 102  CO2 26  GLUCOSE 101*  BUN 12  CREATININE 0.53  CALCIUM 9.2     ASSESSMENT AND PLAN: 72 y.o. female with the following: Right both bone forearm fracture  -Patient will require surgical intervention with ORIF of ulna and radius -Sugar tong splint applied - Weight Bearing Status/Activity: NWB RUE - Pain control with OTC pain medications and PO narcotics if needed -Aggressive ice and elevation - Additional recommended labs/tests: None - Follow-up plan: Outpatient follow up in the office tomorrow for surgical planning. Strict return precautions discussed with patient including pain out of proportion, pain with passive movement of fingers, numbness/tingling/paraesthesias, cold hand/fingers  .Luci Bank 05/18/2023, 11:20 PM Orthopaedic Surgery

## 2023-05-18 NOTE — ED Provider Notes (Addendum)
[place] EMERGENCY DEPARTMENT AT [facility] Provider Note  HPI   Linda Richard is a 72 y.o. female patient with a PMHx of aspirin use daily baby dose, who is here today with concern for right arm.  Patient was walking, when blowing tree branch, fell, landed on her right arm, she had immediate pain, applied a splint, this happened earlier this morning, she has been in the emergency room waiting room for multiple hours, she is here today with complaint of pain in that area   ROS Negative except as per HPI   Medical Decision Making   Upon presentation, the patient is afebrile hemodynamically   Patient has 2+ radial pulse, grip strength intact, able to move all fingers appropriately, sensation diffusely intact, there is swelling over the middle of the forearm on the right side, there is a small abrasion on the posterior aspect of the forearm, does not appear to be any open bone exposed, she has severe tenderness and instability of the arm   Severely displaced right radial and ulnar fractures as noted above   Reach out to Ortho, ordered a long-arm sugar-tong splint, orthopedics is going to come and evaluate this patient   Labs look good, no anemia no leukocytosis no platelet abnormality glucose 101 no major metabolic derangement.  Patient was given ibuprofen tetanus and antibiotics.  We will admit this patient to the orthopedic service at this time   This patient actually will be discharged now per the Ortho attending.  Just he is going to see her tomorrow in clinic, for planned surgery in the next day or 2. Will send home with some pain medicines.   1. Fall, initial encounter   2. Closed fracture of shaft of right radius, unspecified fracture morphology, initial encounter     @DISPOSITION @  Rx / DC Orders ED Discharge Orders     None        Past Medical History:  Diagnosis Date   Acute appendicitis 09/28/2022   Anxiety    takes Lexapro daily-just started  01/19/15   BACK PAIN 01/24/2009   Qualifier: Diagnosis of  By: Fabian Sharp MD, Neta Mends Uses inversion therapy and doing very well    Diverticulitis of intestine with perforation 10/06/2014   DVT (deep vein thrombosis) in pregnancy 1983   behind left leg   History of kidney stones 2011   History of shingles    Joint pain    Pneumonia as a child   Urinary frequency    Urinary urgency    just started taking Myrbetriq   Varicose veins    strip and laser   Past Surgical History:  Procedure Laterality Date   CESAREAN SECTION  1983   CHOLECYSTECTOMY N/A 01/30/2015   Procedure: LAPAROSCOPIC CHOLECYSTECTOMY;  Surgeon: Emelia Loron, MD;  Location: [facility] OR;  Service: General;  Laterality: N/A;   COLONOSCOPY     LAPAROSCOPIC APPENDECTOMY N/A 09/29/2022   Procedure: APPENDECTOMY LAPAROSCOPIC;  Surgeon: Berna Bue, MD;  Location: [facility] OR;  Service: General;  Laterality: N/A;   OPEN REDUCTION INTERNAL FIXATION (ORIF) DISTAL RADIAL FRACTURE Left 12/04/2020   Procedure: OPEN TREATMENT OF DISPLACED LEFT DISTAL RADIUS FRACTURE;  Surgeon: Mack Hook, MD;  Location: [facility];  Service: Orthopedics;  Laterality: Left;   TONSILLECTOMY  as a child   veins stripped  80's   laser RX    Family History  Problem Relation Age of Onset   Hypertension Other    Kidney disease Other  renal stones   Arthritis Other    Cancer Father        bladder   56s    Ovarian cancer Mother        86s   Other Other        son age 50 irreg HB  had cardioversion    Thyroid disease Other    Colon polyps Neg Hx    Colon cancer Neg Hx    Rectal cancer Neg Hx    Stomach cancer Neg Hx    Social History   Socioeconomic History   Marital status: Married    Spouse name: Not on file   Number of children: Not on file   Years of education: Not on file   Highest education level: Bachelor's degree (e.g., BA, AB, BS)  Occupational History   Not on file  Tobacco Use   Smoking status: Never    Smokeless tobacco: Never  Vaping Use   Vaping status: Never Used  Substance and Sexual Activity   Alcohol use: Yes    Alcohol/week: 0.0 standard drinks of alcohol    Comment: 3 x a week, mostly wine no t daily]   Drug use: No   Sexual activity: Yes    Birth control/protection: Post-menopausal  Other Topics Concern   Not on file  Social History Narrative   HH of 2    Grandchild  Sons    reitred [employer] Child Support   Married.    No  ets tobacco No  Exercise restriction   etoh 2 x per week.    Exercise good.       G3P2   Social Drivers of Corporate investment banker Strain: Low Risk  (07/03/2022)   Overall Financial Resource Strain (CARDIA)    Difficulty of Paying Living Expenses: Not hard at all  Food Insecurity: No Food Insecurity (07/03/2022)   Hunger Vital Sign    Worried About Running Out of Food in the Last Year: Never true    Ran Out of Food in the Last Year: Never true  Transportation Needs: No Transportation Needs (07/03/2022)   PRAPARE - Administrator, Civil Service (Medical): No    Lack of Transportation (Non-Medical): No  Physical Activity: Sufficiently Active (12/24/2022)   Exercise Vital Sign    Days of Exercise per Week: 6 days    Minutes of Exercise per Session: 90 min  Stress: No Stress Concern Present (12/24/2022)   [employer] of Occupational Health - Occupational Stress Questionnaire    Feeling of Stress : Not at all  Social Connections: Moderately Isolated (12/24/2022)   Social Connection and Isolation Panel [NHANES]    Frequency of Communication with Friends and Family: More than three times a week    Frequency of Social Gatherings with Friends and Family: More than three times a week    Attends Religious Services: Never    Database administrator or Organizations: No    Attends Banker Meetings: Never    Marital Status: Married  Catering manager Violence: Not At Risk (12/24/2022)   Humiliation, Afraid, Rape, and Kick  questionnaire    Fear of Current or Ex-Partner: No    Emotionally Abused: No    Physically Abused: No    Sexually Abused: No     Physical Exam   Vitals:   05/18/23 1353 05/18/23 1702 05/18/23 2030 05/18/23 2045  BP:  (!) 141/69 118/61 (!) 134/58  Pulse:  71 64 67  Resp:  16  17  Temp:      SpO2:  97% 99% 100%  Weight: 57.6 kg     Height: 5\' 8"  (1.727 m)       Physical Exam Vitals and nursing note reviewed.  Constitutional:      General: She is not in acute distress.    Appearance: She is well-developed.  HENT:     Head: Normocephalic and atraumatic.  Eyes:     Conjunctiva/sclera: Conjunctivae normal.  Neck:     Comments: No cervical thoracic or lumbar midline Cardiovascular:     Rate and Rhythm: Normal rate and regular rhythm.     Heart sounds: No murmur heard. Pulmonary:     Effort: Pulmonary effort is normal. No respiratory distress.     Breath sounds: Normal breath sounds.  Abdominal:     Palpations: Abdomen is soft.     Tenderness: There is no abdominal tenderness.  Musculoskeletal:     Cervical back: Neck supple.     Comments: Patient has 2+ radial pulse, grip strength intact, able to move all fingers appropriately, sensation diffusely intact, there is swelling over the middle of the forearm on the right side, there is a small abrasion on the posterior aspect of the forearm, does not appear to be any open bone exposed, she has severe tenderness and instability of the arm  Skin:    General: Skin is warm and dry.     Capillary Refill: Capillary refill takes less than 2 seconds.  Neurological:     General: No focal deficit present.     Mental Status: She is alert and oriented to person, place, and time.  Psychiatric:        Mood and Affect: Mood normal.     Procedures   If procedures were preformed on this patient, they are listed below:  Procedures  The patient was seen, evaluated, and treated in conjunction with the attending physician, who voiced  agreement in the care provided.  Note generated using Dragon voice dictation software and may contain dictation errors. Please contact me for any clarification or with any questions.   Electronically signed by:  Osvaldo Shipper, M.D. (PGY-2)    Gunnar Bulla, MD 05/18/23 2209    Charlynne Pander, MD 05/18/23 2226    Gunnar Bulla, MD 05/18/23 2250    Gunnar Bulla, MD 05/18/23 2251    Gunnar Bulla, MD 05/18/23 2251    Charlynne Pander, MD 05/18/23 2253

## 2023-05-18 NOTE — ED Triage Notes (Signed)
Patient was hiking when a tree branch fell on it with deformity to right forearm.  Patient has a wound where the deformity is but refused to allow provider to removed make shift splint to assess.

## 2023-05-18 NOTE — ED Provider Triage Note (Cosign Needed)
Emergency Medicine Provider Triage Evaluation Note  Linda Richard , a 72 y.o. female  was evaluated in triage.  Pt complains of R forearm pain after branch/limb fell on arm while hiking. No Blood thinners or head trauma.   Review of Systems  Positive: R forearm Negative: numbness  Physical Exam  BP (!) 167/73 (BP Location: Left Arm)   Pulse 74   Temp 98.2 F (36.8 C)   Resp 16   SpO2 96%  Gen:   Awake, no distress   Resp:  Normal effort  MSK:   Moves extremities without difficulty  Other:  +ttp of R forearm, no snuffbox or wrist pain. +pulse  Medical Decision Making  Medically screening exam initiated at 1:46 PM.  Appropriate orders placed.  Linda Richard was informed that the remainder of the evaluation will be completed by another provider, this initial triage assessment does not replace that evaluation, and the importance of remaining in the ED until their evaluation is complete.    Pete Pelt, [place] 05/18/23 1348

## 2023-05-18 NOTE — Progress Notes (Signed)
Orthopedic Tech Progress Note Patient Details:  Linda Richard Apr 24, 1951 [id number]  Ortho Devices Type of Ortho Device: Sugartong splint Ortho Device/Splint Location: rue Ortho Device/Splint Interventions: Ordered, Application, Adjustment  The ortho dr held for me while I applied the splint. Post Interventions Patient Tolerated: Well Instructions Provided: Care of device, Adjustment of device  Trinna Post 05/18/2023, 8:16 PM

## 2023-12-24 NOTE — Progress Notes (Unsigned)
 No chief complaint on file.   HPI: Patient  Linda Richard  73 y.o. comes in today for Preventive Health Care visit   Health Maintenance  Topic Date Due   Medicare Annual Wellness (AWV)  Never done   COVID-19 Vaccine (4 - 2024-25 season) 01/19/2023   MAMMOGRAM  02/14/2023   INFLUENZA VACCINE  12/19/2023   Colonoscopy  09/29/2024   DTaP/Tdap/Td (4 - Td or Tdap) 05/17/2033   Pneumococcal Vaccine: 50+ Years  Completed   DEXA SCAN  Completed   Hepatitis C Screening  Completed   Zoster Vaccines- Shingrix  Completed   Hepatitis B Vaccines  Aged Out   HPV VACCINES  Aged Out   Meningococcal B Vaccine  Aged Out   Health Maintenance Review LIFESTYLE:  Exercise:   Tobacco/ETS: Alcohol:  Sugar beverages: Sleep: Drug use: no HH of  Work:    ROS:  GEN/ HEENT: No fever, significant weight changes sweats headaches vision problems hearing changes, CV/ PULM; No chest pain shortness of breath cough, syncope,edema  change in exercise tolerance. GI /GU: No adominal pain, vomiting, change in bowel habits. No blood in the stool. No significant GU symptoms. SKIN/HEME: ,no acute skin rashes suspicious lesions or bleeding. No lymphadenopathy, nodules, masses.  NEURO/ PSYCH:  No neurologic signs such as weakness numbness. No depression anxiety. IMM/ Allergy: No unusual infections.  Allergy .   REST of 12 system review negative except as per HPI   Past Medical History:  Diagnosis Date   Acute appendicitis 09/28/2022   Anxiety    takes Lexapro daily-just started 01/19/15   BACK PAIN 01/24/2009   Qualifier: Diagnosis of  By: Charlett MD, Apolinar POUR Uses inversion therapy and doing very well    Diverticulitis of intestine with perforation 10/06/2014   DVT (deep vein thrombosis) in pregnancy 1983   behind left leg   History of kidney stones 2011   History of shingles    Joint pain    Pneumonia as a child   Urinary frequency    Urinary urgency    just started taking Myrbetriq   Varicose  veins    strip and laser    Past Surgical History:  Procedure Laterality Date   CESAREAN SECTION  1983   CHOLECYSTECTOMY N/A 01/30/2015   Procedure: LAPAROSCOPIC CHOLECYSTECTOMY;  Surgeon: Donnice Bury, MD;  Location: [facility] OR;  Service: General;  Laterality: N/A;   COLONOSCOPY     LAPAROSCOPIC APPENDECTOMY N/A 09/29/2022   Procedure: APPENDECTOMY LAPAROSCOPIC;  Surgeon: Signe Mitzie LABOR, MD;  Location: [facility] OR;  Service: General;  Laterality: N/A;   OPEN REDUCTION INTERNAL FIXATION (ORIF) DISTAL RADIAL FRACTURE Left 12/04/2020   Procedure: OPEN TREATMENT OF DISPLACED LEFT DISTAL RADIUS FRACTURE;  Surgeon: Sebastian Lenis, MD;  Location: [facility];  Service: Orthopedics;  Laterality: Left;   TONSILLECTOMY  as a child   veins stripped  80's   laser RX     Family History  Problem Relation Age of Onset   Hypertension Other    Kidney disease Other        renal stones   Arthritis Other    Cancer Father        bladder   53s    Ovarian cancer Mother        34s   Other Other        son age 31 irreg HB  had cardioversion    Thyroid  disease Other    Colon polyps Neg Hx  Colon cancer Neg Hx    Rectal cancer Neg Hx    Stomach cancer Neg Hx     Social History   Socioeconomic History   Marital status: Married    Spouse name: Not on file   Number of children: Not on file   Years of education: Not on file   Highest education level: Bachelor's degree (e.g., BA, AB, BS)  Occupational History   Not on file  Tobacco Use   Smoking status: Never   Smokeless tobacco: Never  Vaping Use   Vaping status: Never Used  Substance and Sexual Activity   Alcohol use: Yes    Alcohol/week: 0.0 standard drinks of alcohol    Comment: 3 x a week, mostly wine no t daily]   Drug use: No   Sexual activity: Yes    Birth control/protection: Post-menopausal  Other Topics Concern   Not on file  Social History Narrative   HH of 2    Grandchild  Sons    reitred [employer] Child Support    Married.    No  ets tobacco No  Exercise restriction   etoh 2 x per week.    Exercise good.       G3P2   Social Drivers of Corporate investment banker Strain: Low Risk  (12/18/2023)   Overall Financial Resource Strain (CARDIA)    Difficulty of Paying Living Expenses: Not hard at all  Food Insecurity: No Food Insecurity (12/18/2023)   Hunger Vital Sign    Worried About Running Out of Food in the Last Year: Never true    Ran Out of Food in the Last Year: Never true  Transportation Needs: No Transportation Needs (12/18/2023)   PRAPARE - Administrator, Civil Service (Medical): No    Lack of Transportation (Non-Medical): No  Physical Activity: Sufficiently Active (12/18/2023)   Exercise Vital Sign    Days of Exercise per Week: 7 days    Minutes of Exercise per Session: 110 min  Stress: No Stress Concern Present (12/18/2023)   [employer] of Occupational Health - Occupational Stress Questionnaire    Feeling of Stress: Not at all  Social Connections: Moderately Isolated (12/18/2023)   Social Connection and Isolation Panel    Frequency of Communication with Friends and Family: More than three times a week    Frequency of Social Gatherings with Friends and Family: Three times a week    Attends Religious Services: Never    Active Member of Clubs or Organizations: No    Attends Engineer, structural: Not on file    Marital Status: Married    Outpatient Medications Prior to Visit  Medication Sig Dispense Refill   acetaminophen  (TYLENOL ) 500 MG tablet Take 2 tablets (1,000 mg total) by mouth every 8 (eight) hours as needed. 30 tablet 0   aspirin 81 MG tablet Take 81 mg by mouth daily.       busPIRone (BUSPAR) 7.5 MG tablet Take 7.5 mg by mouth 2 (two) times daily.     Calcium Carb-Cholecalciferol (CALTRATE 600+D3 PO) Take 1 tablet by mouth in the morning.     escitalopram (LEXAPRO) 10 MG tablet Take 10 mg by mouth in the morning.     estradiol (ESTRACE) 0.1 MG/GM  vaginal cream Place 1 g vaginally 2 (two) times a week.     Multiple Vitamins-Minerals (ONE DAILY WOMENS 50+ PO) Take 1 tablet by mouth in the morning.     oxyCODONE -acetaminophen  (PERCOCET) 5-325 MG tablet  Take 1 tablet by mouth every 4 (four) hours as needed. 10 tablet 0   Polyvinyl Alcohol-Povidone (REFRESH OP) Place 1 drop into both eyes 2 (two) times daily as needed (dry eye).     Probiotic Product (PROBIOTIC DAILY PO) Take 1 capsule by mouth in the morning. align     tretinoin (RETIN-A) 0.025 % cream Apply 1 Application topically at bedtime.  99   No facility-administered medications prior to visit.     EXAM:  There were no vitals taken for this visit.  There is no height or weight on file to calculate BMI. Wt Readings from Last 3 Encounters:  05/18/23 127 lb (57.6 kg)  12/24/22 127 lb 9.6 oz (57.9 kg)  10/28/22 125 lb (56.7 kg)    Physical Exam: Vital signs reviewed HZW:Uypd is a well-developed well-nourished alert cooperative    who appearsr stated age in no acute distress.  HEENT: normocephalic atraumatic , Eyes: PERRL EOM's full, conjunctiva clear, Nares: paten,t no deformity discharge or tenderness., Ears: no deformity EAC's clear TMs with normal landmarks. Mouth: clear OP, no lesions, edema.  Moist mucous membranes. Dentition in adequate repair. NECK: supple without masses, thyromegaly or bruits. CHEST/PULM:  Clear to auscultation and percussion breath sounds equal no wheeze , rales or rhonchi. No chest wall deformities or tenderness. Breast: normal by inspection . No dimpling, discharge, masses, tenderness or discharge . CV: PMI is nondisplaced, S1 S2 no gallops, murmurs, rubs. Peripheral pulses are full without delay.No JVD .  ABDOMEN: Bowel sounds normal nontender  No guard or rebound, no hepato splenomegal no CVA tenderness.  No hernia. Extremtities:  No clubbing cyanosis or edema, no acute joint swelling or redness no focal atrophy NEURO:  Oriented x3, cranial nerves  3-12 appear to be intact, no obvious focal weakness,gait within normal limits no abnormal reflexes or asymmetrical SKIN: No acute rashes normal turgor, color, no bruising or petechiae. PSYCH: Oriented, good eye contact, no obvious depression anxiety, cognition and judgment appear normal. LN: no cervical axillary inguinal adenopathy  Lab Results  Component Value Date   WBC 9.7 05/18/2023   HGB 13.8 05/18/2023   HCT 41.2 05/18/2023   PLT 186 05/18/2023   GLUCOSE 101 (H) 05/18/2023   CHOL 214 (H) 01/02/2023   TRIG 61.0 01/02/2023   HDL 86.30 01/02/2023   LDLDIRECT 128.8 02/23/2013   LDLCALC 115 (H) 01/02/2023   ALT 18 01/02/2023   AST 21 01/02/2023   NA 135 05/18/2023   K 3.8 05/18/2023   CL 102 05/18/2023   CREATININE 0.53 05/18/2023   BUN 12 05/18/2023   CO2 26 05/18/2023   TSH 1.33 01/02/2023   HGBA1C 5.3 11/13/2018   MICROALBUR 2.0 (H) 01/24/2009    BP Readings from Last 3 Encounters:  05/18/23 138/76  12/24/22 130/80  10/28/22 130/77    Lab results reviewed with patient   ASSESSMENT AND PLAN:  Discussed the following assessment and plan:    ICD-10-CM   1. Visit for preventive health examination  Z00.00     2. Medication management  Z79.899      No follow-ups on file.  Patient Care Team: Montie Swiderski, Apolinar POUR, MD as PCP - General Gorge Ade, MD (Obstetrics and Gynecology) Geraldene Loving, MD as Consulting Physician (Ophthalmology) Junior Emmit Ivanoff, MD as Referring Physician (Dermatology) Stuart Norris, NP as Nurse Practitioner (Obstetrics and Gynecology) There are no Patient Instructions on file for this visit.  Kayce Chismar K. Berkleigh Beckles M.D.

## 2023-12-25 ENCOUNTER — Ambulatory Visit: Payer: Self-pay | Admitting: Internal Medicine

## 2023-12-25 ENCOUNTER — Ambulatory Visit (INDEPENDENT_AMBULATORY_CARE_PROVIDER_SITE_OTHER): Payer: Medicare Other | Admitting: Internal Medicine

## 2023-12-25 ENCOUNTER — Encounter: Payer: Self-pay | Admitting: Internal Medicine

## 2023-12-25 VITALS — BP 124/80 | HR 64 | Temp 97.7°F | Ht 68.0 in | Wt 129.0 lb

## 2023-12-25 DIAGNOSIS — Z0000 Encounter for general adult medical examination without abnormal findings: Secondary | ICD-10-CM

## 2023-12-25 DIAGNOSIS — Z8249 Family history of ischemic heart disease and other diseases of the circulatory system: Secondary | ICD-10-CM

## 2023-12-25 DIAGNOSIS — E785 Hyperlipidemia, unspecified: Secondary | ICD-10-CM | POA: Diagnosis not present

## 2023-12-25 DIAGNOSIS — Z79899 Other long term (current) drug therapy: Secondary | ICD-10-CM | POA: Diagnosis not present

## 2023-12-25 LAB — COMPREHENSIVE METABOLIC PANEL WITH GFR
ALT: 18 U/L (ref 0–35)
AST: 25 U/L (ref 0–37)
Albumin: 4.2 g/dL (ref 3.5–5.2)
Alkaline Phosphatase: 52 U/L (ref 39–117)
BUN: 12 mg/dL (ref 6–23)
CO2: 27 meq/L (ref 19–32)
Calcium: 9.7 mg/dL (ref 8.4–10.5)
Chloride: 101 meq/L (ref 96–112)
Creatinine, Ser: 0.45 mg/dL (ref 0.40–1.20)
GFR: 96.91 mL/min (ref >60.00)
Glucose, Bld: 92 mg/dL (ref 70–99)
Potassium: 4.4 meq/L (ref 3.5–5.1)
Sodium: 140 meq/L (ref 135–145)
Total Bilirubin: 0.6 mg/dL (ref 0.2–1.2)
Total Protein: 7.1 g/dL (ref 6.0–8.3)

## 2023-12-25 LAB — CBC WITH DIFFERENTIAL/PLATELET
Basophils Absolute: 0 K/uL (ref 0.0–0.1)
Basophils Relative: 0.5 % (ref 0.0–3.0)
Eosinophils Absolute: 0.2 K/uL (ref 0.0–0.7)
Eosinophils Relative: 2.4 % (ref 0.0–5.0)
HCT: 44.9 % (ref 36.0–46.0)
Hemoglobin: 15.1 g/dL — ABNORMAL HIGH (ref 12.0–15.0)
Lymphocytes Relative: 21.3 % (ref 12.0–46.0)
Lymphs Abs: 1.4 K/uL (ref 0.7–4.0)
MCHC: 33.7 g/dL (ref 30.0–36.0)
MCV: 93.2 fl (ref 78.0–100.0)
Monocytes Absolute: 0.5 K/uL (ref 0.1–1.0)
Monocytes Relative: 7.3 % (ref 3.0–12.0)
Neutro Abs: 4.4 K/uL (ref 1.4–7.7)
Neutrophils Relative %: 68.5 % (ref 43.0–77.0)
Platelets: 188 K/uL (ref 150.0–400.0)
RBC: 4.81 Mil/uL (ref 3.87–5.11)
RDW: 12.8 % (ref 11.5–15.5)
WBC: 6.4 K/uL (ref 4.0–10.5)

## 2023-12-25 LAB — LIPID PANEL
Cholesterol: 225 mg/dL — ABNORMAL HIGH (ref 0–200)
HDL: 90.8 mg/dL (ref >39.00)
LDL Cholesterol: 123 mg/dL — ABNORMAL HIGH (ref 0–99)
NonHDL: 133.73
Total CHOL/HDL Ratio: 2
Triglycerides: 55 mg/dL (ref 0.0–149.0)
VLDL: 11 mg/dL (ref 0.0–40.0)

## 2023-12-25 LAB — HEMOGLOBIN A1C: Hgb A1c MFr Bld: 5.6 % (ref 4.6–6.5)

## 2023-12-25 LAB — TSH: TSH: 1.07 u[IU]/mL (ref 0.35–5.50)

## 2023-12-25 NOTE — Patient Instructions (Signed)
 Good to see you today . Updating labs Optimize weight bearing and muscle exercises  59-60 grams of protein per day and adequate vit d .   If all ok then yearly check

## 2023-12-25 NOTE — Progress Notes (Signed)
 Labs stable   no diabetes.
